# Patient Record
Sex: Male | Born: 1980 | Race: White | Hispanic: No | Marital: Married | Smoking: Former smoker
Health system: Southern US, Community
[De-identification: ages and names within clinical notes are randomized; demographics above are authoritative.]

## PROBLEM LIST (undated history)

## (undated) DIAGNOSIS — R41840 Attention and concentration deficit: Secondary | ICD-10-CM

## (undated) DIAGNOSIS — K649 Unspecified hemorrhoids: Secondary | ICD-10-CM

## (undated) HISTORY — DX: Attention and concentration deficit: R41.840

## (undated) HISTORY — DX: Unspecified hemorrhoids: K64.9

---

## 2004-08-03 ENCOUNTER — Ambulatory Visit (HOSPITAL_COMMUNITY): Admission: RE | Admit: 2004-08-03 | Discharge: 2004-08-03 | Payer: Self-pay | Admitting: Family Medicine

## 2008-05-10 ENCOUNTER — Ambulatory Visit (HOSPITAL_COMMUNITY): Admission: RE | Admit: 2008-05-10 | Discharge: 2008-05-10 | Payer: Self-pay | Admitting: Urology

## 2008-07-21 ENCOUNTER — Ambulatory Visit: Payer: Self-pay | Admitting: Gastroenterology

## 2008-10-06 ENCOUNTER — Ambulatory Visit: Payer: Self-pay | Admitting: Gastroenterology

## 2010-11-13 ENCOUNTER — Emergency Department: Payer: Self-pay | Admitting: Emergency Medicine

## 2013-12-19 ENCOUNTER — Emergency Department: Payer: Self-pay | Admitting: Emergency Medicine

## 2013-12-19 ENCOUNTER — Ambulatory Visit: Payer: Self-pay | Admitting: Student

## 2013-12-21 DIAGNOSIS — M958 Other specified acquired deformities of musculoskeletal system: Secondary | ICD-10-CM | POA: Insufficient documentation

## 2013-12-21 DIAGNOSIS — S82899A Other fracture of unspecified lower leg, initial encounter for closed fracture: Secondary | ICD-10-CM | POA: Insufficient documentation

## 2014-01-10 DIAGNOSIS — Z8781 Personal history of (healed) traumatic fracture: Secondary | ICD-10-CM | POA: Insufficient documentation

## 2014-01-10 DIAGNOSIS — Z9889 Other specified postprocedural states: Secondary | ICD-10-CM

## 2014-03-17 DIAGNOSIS — S82843A Displaced bimalleolar fracture of unspecified lower leg, initial encounter for closed fracture: Secondary | ICD-10-CM | POA: Insufficient documentation

## 2014-11-18 HISTORY — PX: ANKLE SURGERY: SHX546

## 2015-02-20 ENCOUNTER — Ambulatory Visit
Admit: 2015-02-20 | Disposition: A | Discharge status: Home / Self Care | Payer: Self-pay | Attending: Pain Medicine | Admitting: Pain Medicine
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
## 2015-04-24 ENCOUNTER — Encounter: Payer: Self-pay | Admitting: Emergency Medicine

## 2015-04-24 ENCOUNTER — Emergency Department: Payer: BLUE CROSS/BLUE SHIELD

## 2015-04-24 ENCOUNTER — Emergency Department
Admission: EM | Admit: 2015-04-24 | Discharge: 2015-04-24 | Disposition: A | Discharge status: Home / Self Care | Payer: BLUE CROSS/BLUE SHIELD | Attending: Emergency Medicine | Admitting: Emergency Medicine

## 2015-04-24 DIAGNOSIS — S20412A Abrasion of left back wall of thorax, initial encounter: Secondary | ICD-10-CM | POA: Diagnosis not present

## 2015-04-24 DIAGNOSIS — S60512A Abrasion of left hand, initial encounter: Secondary | ICD-10-CM | POA: Insufficient documentation

## 2015-04-24 DIAGNOSIS — Y998 Other external cause status: Secondary | ICD-10-CM | POA: Diagnosis not present

## 2015-04-24 DIAGNOSIS — S80811A Abrasion, right lower leg, initial encounter: Secondary | ICD-10-CM | POA: Diagnosis not present

## 2015-04-24 DIAGNOSIS — S40811A Abrasion of right upper arm, initial encounter: Secondary | ICD-10-CM

## 2015-04-24 DIAGNOSIS — Y9389 Activity, other specified: Secondary | ICD-10-CM | POA: Diagnosis not present

## 2015-04-24 DIAGNOSIS — S60511A Abrasion of right hand, initial encounter: Secondary | ICD-10-CM | POA: Diagnosis not present

## 2015-04-24 DIAGNOSIS — Y9289 Other specified places as the place of occurrence of the external cause: Secondary | ICD-10-CM | POA: Diagnosis not present

## 2015-04-24 DIAGNOSIS — S50311A Abrasion of right elbow, initial encounter: Secondary | ICD-10-CM | POA: Insufficient documentation

## 2015-04-24 DIAGNOSIS — Z720 Tobacco use: Secondary | ICD-10-CM | POA: Diagnosis not present

## 2015-04-24 DIAGNOSIS — S82142A Displaced bicondylar fracture of left tibia, initial encounter for closed fracture: Secondary | ICD-10-CM

## 2015-04-24 DIAGNOSIS — S82102A Unspecified fracture of upper end of left tibia, initial encounter for closed fracture: Secondary | ICD-10-CM | POA: Insufficient documentation

## 2015-04-24 DIAGNOSIS — S8992XA Unspecified injury of left lower leg, initial encounter: Secondary | ICD-10-CM | POA: Diagnosis present

## 2015-04-24 MED ORDER — SILVER SULFADIAZINE 1 % EX CREA
TOPICAL_CREAM | Freq: Once | CUTANEOUS | Status: AC
  Administered 2015-04-24: 15:00:00 via TOPICAL

## 2015-04-24 MED ORDER — SILVER SULFADIAZINE 1 % EX CREA
TOPICAL_CREAM | CUTANEOUS | Status: AC
  Filled 2015-04-24: qty 85

## 2015-04-24 MED ORDER — NAPROXEN 500 MG PO TABS: 500.0000 mg | ORAL_TABLET | Freq: Two times a day (BID) | ORAL | Status: DC

## 2015-04-24 MED ORDER — OXYCODONE-ACETAMINOPHEN 5-325 MG PO TABS: 1.0000 | ORAL_TABLET | Freq: Four times a day (QID) | ORAL | Status: DC | PRN

## 2015-04-24 MED ORDER — SILVER SULFADIAZINE 1 % EX CREA: TOPICAL_CREAM | Freq: Two times a day (BID) | CUTANEOUS | Status: AC

## 2015-04-24 NOTE — ED Notes (Signed)
Patient refused crutches stating he has a pair at home that he can use.
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
## 2015-04-24 NOTE — ED Notes (Signed)
AAOx3 skin warm and dry.  D/C patient in wheelchair.  Verbalized understanding of d/c instructions.
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
## 2015-04-24 NOTE — Discharge Instructions (Signed)

## 2015-05-01 DIAGNOSIS — S82142A Displaced bicondylar fracture of left tibia, initial encounter for closed fracture: Secondary | ICD-10-CM | POA: Insufficient documentation

## 2016-10-09 ENCOUNTER — Emergency Department
Admission: EM | Admit: 2016-10-09 | Discharge: 2016-10-09 | Disposition: A | Discharge status: Home / Self Care | Payer: No Typology Code available for payment source | Attending: Emergency Medicine | Admitting: Emergency Medicine

## 2016-10-09 ENCOUNTER — Emergency Department: Payer: No Typology Code available for payment source

## 2016-10-09 ENCOUNTER — Encounter: Payer: Self-pay | Admitting: Emergency Medicine

## 2016-10-09 DIAGNOSIS — M545 Low back pain: Secondary | ICD-10-CM | POA: Diagnosis not present

## 2016-10-09 DIAGNOSIS — Y9389 Activity, other specified: Secondary | ICD-10-CM | POA: Insufficient documentation

## 2016-10-09 DIAGNOSIS — S39012A Strain of muscle, fascia and tendon of lower back, initial encounter: Secondary | ICD-10-CM | POA: Diagnosis not present

## 2016-10-09 DIAGNOSIS — Y92410 Unspecified street and highway as the place of occurrence of the external cause: Secondary | ICD-10-CM | POA: Insufficient documentation

## 2016-10-09 DIAGNOSIS — Y999 Unspecified external cause status: Secondary | ICD-10-CM | POA: Diagnosis not present

## 2016-10-09 DIAGNOSIS — S3992XA Unspecified injury of lower back, initial encounter: Secondary | ICD-10-CM | POA: Diagnosis not present

## 2016-10-09 DIAGNOSIS — M791 Myalgia: Secondary | ICD-10-CM | POA: Diagnosis not present

## 2016-10-09 DIAGNOSIS — Z87891 Personal history of nicotine dependence: Secondary | ICD-10-CM | POA: Insufficient documentation

## 2016-10-09 DIAGNOSIS — M7918 Myalgia, other site: Secondary | ICD-10-CM

## 2016-10-09 DIAGNOSIS — R51 Headache: Secondary | ICD-10-CM | POA: Insufficient documentation

## 2016-10-09 DIAGNOSIS — Z791 Long term (current) use of non-steroidal anti-inflammatories (NSAID): Secondary | ICD-10-CM | POA: Diagnosis not present

## 2016-10-09 MED ORDER — IBUPROFEN 800 MG PO TABS: 800.0000 mg | ORAL_TABLET | Freq: Three times a day (TID) | ORAL | 0 refills | Status: DC | PRN

## 2016-10-09 MED ORDER — OXYCODONE-ACETAMINOPHEN 5-325 MG PO TABS: 1.0000 | ORAL_TABLET | Freq: Four times a day (QID) | ORAL | 0 refills | Status: DC | PRN

## 2016-10-09 MED ORDER — CYCLOBENZAPRINE HCL 10 MG PO TABS: 10.0000 mg | ORAL_TABLET | Freq: Three times a day (TID) | ORAL | 0 refills | Status: DC | PRN

## 2016-10-09 NOTE — ED Notes (Signed)
Pt discharged home after verbalizing understanding of discharge instructions; nad noted. 
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
## 2017-01-16 DIAGNOSIS — Z13220 Encounter for screening for lipoid disorders: Secondary | ICD-10-CM | POA: Diagnosis not present

## 2017-01-16 DIAGNOSIS — F321 Major depressive disorder, single episode, moderate: Secondary | ICD-10-CM | POA: Diagnosis not present

## 2017-01-16 DIAGNOSIS — Z0001 Encounter for general adult medical examination with abnormal findings: Secondary | ICD-10-CM | POA: Diagnosis not present

## 2017-01-16 DIAGNOSIS — Z1329 Encounter for screening for other suspected endocrine disorder: Secondary | ICD-10-CM | POA: Diagnosis not present

## 2017-01-16 DIAGNOSIS — F1410 Cocaine abuse, uncomplicated: Secondary | ICD-10-CM | POA: Diagnosis not present

## 2017-01-16 DIAGNOSIS — F17210 Nicotine dependence, cigarettes, uncomplicated: Secondary | ICD-10-CM | POA: Diagnosis not present

## 2017-01-16 DIAGNOSIS — Z131 Encounter for screening for diabetes mellitus: Secondary | ICD-10-CM | POA: Diagnosis not present

## 2017-03-04 DIAGNOSIS — K648 Other hemorrhoids: Secondary | ICD-10-CM | POA: Diagnosis not present

## 2017-03-04 DIAGNOSIS — K5900 Constipation, unspecified: Secondary | ICD-10-CM | POA: Diagnosis not present

## 2017-03-06 ENCOUNTER — Encounter: Payer: Self-pay | Admitting: General Surgery

## 2017-03-11 ENCOUNTER — Ambulatory Visit: Payer: Self-pay | Admitting: General Surgery

## 2017-03-13 ENCOUNTER — Encounter: Payer: Self-pay | Admitting: General Surgery

## 2017-03-13 ENCOUNTER — Ambulatory Visit (INDEPENDENT_AMBULATORY_CARE_PROVIDER_SITE_OTHER): Payer: BLUE CROSS/BLUE SHIELD | Admitting: General Surgery

## 2017-03-13 VITALS — BP 116/72 | HR 72 | Resp 12 | Ht 72.0 in | Wt 171.0 lb

## 2017-03-13 DIAGNOSIS — K645 Perianal venous thrombosis: Secondary | ICD-10-CM

## 2017-03-13 NOTE — Patient Instructions (Addendum)
Advised dietary changes to include more fruits and vegetables, less starch and meat, and plenty of water. Use Miralax daily. Goal is to get bowels moving daily.    No further appointment needed at this time. Patient instructed to call if needed.

## 2017-03-13 NOTE — Progress Notes (Signed)
Patient ID: Delle Reining., male   DOB: Feb 09, 1981, 36 y.o.   MRN: [id number]  Chief Complaint  Patient presents with  . Rectal Problems    hemorrhoids    HPI Mujahid Jalomo. is a 36 y.o. male.  Here today for evaluation  of hemorrhoids. He states his bowels move 2 times a week even with the use of stool softeners and fiber supplements. He states he has had hemorrhoids for 10 years it is just getting worse. Th pain comes and goes, no bleeding. He does use procoto-hc cream and preparation H but it doesn't help much. He states he can feel knots at his rectum. He states it's flares up about once a week. He does do heavy lifting at his job.  HPI  Past Medical History:  Diagnosis Date  . Hemorrhoid     Past Surgical History:  Procedure Laterality Date  . ANKLE SURGERY  2016    Family History  Problem Relation Age of Onset  . COPD Father     Social History Social History  Substance Use Topics  . Smoking status: Former Smoker    Types: Cigarettes    Quit date: 2017  . Smokeless tobacco: Never Used  . Alcohol use No    No Known Allergies  Current Outpatient Prescriptions  Medication Sig Dispense Refill  . docusate sodium (COLACE) 100 MG capsule Take 100 mg by mouth daily.    Marland Kitchen ibuprofen (ADVIL,MOTRIN) 800 MG tablet Take 1 tablet (800 mg total) by mouth every 8 (eight) hours as needed for moderate pain. 15 tablet 0  . Multiple Vitamin (MULTIVITAMIN) capsule Take 1 capsule by mouth daily.    . naproxen (NAPROSYN) 500 MG tablet Take 1 tablet (500 mg total) by mouth 2 (two) times daily with a meal. 30 tablet 0  . PROCTO-MED HC 2.5 % rectal cream Place 1 application rectally as needed.   2   No current facility-administered medications for this visit.     Review of Systems Review of Systems  Blood pressure 116/72, pulse 72, resp. rate 12, height 6' (1.829 m), weight 171 lb (77.6 kg).  Physical Exam Physical Exam  Constitutional: He is oriented to person, place,  and time. He appears well-developed and well-nourished.  HENT:  Mouth/Throat: Oropharynx is clear and moist.  Eyes: Conjunctivae are normal. No scleral icterus.  Neck: Neck supple.  Cardiovascular: Normal rate, regular rhythm and normal heart sounds.   Pulmonary/Chest: Effort normal and breath sounds normal.  Abdominal: Soft. Bowel sounds are normal. There is no tenderness.  Genitourinary: Rectal exam shows external hemorrhoid. Rectal exam shows no internal hemorrhoid and no fissure.  Genitourinary Comments: Thrombosed external hemorrhoid about 1 cm in size. Increased sphincter tone. No internal prolapsed hemorrhoids.   Lymphadenopathy:    He has no cervical adenopathy.  Neurological: He is alert and oriented to person, place, and time.  Skin: Skin is warm and dry.  Psychiatric: His behavior is normal.    Data Reviewed History reviewed  Assessment    Thrombosed external hemorrhoid about 1 cm in size. Increased sphincter tone. No internal prolapsed hemorrhoids.   History of constipation likely aggravating his hemorrhoid symptoms  Plan    Advised dietary changes to include more fruits and vegetables, less starch and meat, and plenty of water. Use Miralax daily. Goal is to get bowels moving more easily   No further appointment needed at this time. Patient instructed to call if needed.    HPI, Physical Exam,  Assessment and Plan have been scribed under the direction and in the presence of Kathreen Cosier, MD  Dorathy Daft, RN  I have completed the exam and reviewed the above documentation for accuracy and completeness.  I agree with the above.  Museum/gallery conservator has been used and any errors in dictation or transcription are unintentional.  Allysia Ingles G. Evette Cristal, M.D., F.A.C.S.  Gerlene Burdock G 03/13/2017, 4:57 PM

## 2017-04-23 DIAGNOSIS — Z0001 Encounter for general adult medical examination with abnormal findings: Secondary | ICD-10-CM | POA: Diagnosis not present

## 2017-04-23 DIAGNOSIS — G4719 Other hypersomnia: Secondary | ICD-10-CM | POA: Diagnosis not present

## 2017-04-23 DIAGNOSIS — M064 Inflammatory polyarthropathy: Secondary | ICD-10-CM | POA: Diagnosis not present

## 2017-04-24 ENCOUNTER — Other Ambulatory Visit: Payer: Self-pay | Admitting: Nurse Practitioner

## 2017-04-24 ENCOUNTER — Ambulatory Visit
Admission: RE | Admit: 2017-04-24 | Discharge: 2017-04-24 | Disposition: A | Discharge status: Home / Self Care | Payer: BLUE CROSS/BLUE SHIELD | Source: Ambulatory Visit | Attending: Nurse Practitioner | Admitting: Nurse Practitioner

## 2017-04-24 DIAGNOSIS — R0602 Shortness of breath: Secondary | ICD-10-CM | POA: Insufficient documentation

## 2017-05-07 DIAGNOSIS — M064 Inflammatory polyarthropathy: Secondary | ICD-10-CM | POA: Diagnosis not present

## 2017-05-07 DIAGNOSIS — G4719 Other hypersomnia: Secondary | ICD-10-CM | POA: Diagnosis not present

## 2017-05-07 DIAGNOSIS — R41840 Attention and concentration deficit: Secondary | ICD-10-CM | POA: Diagnosis not present

## 2017-05-19 DIAGNOSIS — F17210 Nicotine dependence, cigarettes, uncomplicated: Secondary | ICD-10-CM | POA: Diagnosis not present

## 2017-05-19 DIAGNOSIS — G4710 Hypersomnia, unspecified: Secondary | ICD-10-CM | POA: Diagnosis not present

## 2017-05-29 DIAGNOSIS — R41840 Attention and concentration deficit: Secondary | ICD-10-CM | POA: Diagnosis not present

## 2017-05-29 DIAGNOSIS — G4710 Hypersomnia, unspecified: Secondary | ICD-10-CM | POA: Diagnosis not present

## 2017-08-07 DIAGNOSIS — G4733 Obstructive sleep apnea (adult) (pediatric): Secondary | ICD-10-CM | POA: Diagnosis not present

## 2017-08-14 DIAGNOSIS — G4710 Hypersomnia, unspecified: Secondary | ICD-10-CM | POA: Diagnosis not present

## 2017-08-14 DIAGNOSIS — F17210 Nicotine dependence, cigarettes, uncomplicated: Secondary | ICD-10-CM | POA: Diagnosis not present

## 2017-08-27 DIAGNOSIS — G4719 Other hypersomnia: Secondary | ICD-10-CM | POA: Diagnosis not present

## 2017-08-27 DIAGNOSIS — R41840 Attention and concentration deficit: Secondary | ICD-10-CM | POA: Diagnosis not present

## 2017-08-27 DIAGNOSIS — G5600 Carpal tunnel syndrome, unspecified upper limb: Secondary | ICD-10-CM | POA: Diagnosis not present

## 2017-09-22 DIAGNOSIS — G4710 Hypersomnia, unspecified: Secondary | ICD-10-CM | POA: Diagnosis not present

## 2017-09-28 DIAGNOSIS — R41840 Attention and concentration deficit: Secondary | ICD-10-CM | POA: Diagnosis not present

## 2017-09-28 DIAGNOSIS — G4719 Other hypersomnia: Secondary | ICD-10-CM | POA: Diagnosis not present

## 2017-11-25 ENCOUNTER — Ambulatory Visit: Payer: BLUE CROSS/BLUE SHIELD | Admitting: Nurse Practitioner

## 2017-11-25 ENCOUNTER — Other Ambulatory Visit: Payer: Self-pay

## 2017-11-25 ENCOUNTER — Encounter: Payer: Self-pay | Admitting: Nurse Practitioner

## 2017-11-25 VITALS — BP 120/80 | HR 81 | Resp 16 | Ht 72.0 in | Wt 169.8 lb

## 2017-11-25 DIAGNOSIS — G5603 Carpal tunnel syndrome, bilateral upper limbs: Secondary | ICD-10-CM

## 2017-11-25 DIAGNOSIS — R41840 Attention and concentration deficit: Secondary | ICD-10-CM | POA: Diagnosis not present

## 2017-11-25 DIAGNOSIS — F17210 Nicotine dependence, cigarettes, uncomplicated: Secondary | ICD-10-CM | POA: Insufficient documentation

## 2017-11-25 DIAGNOSIS — F321 Major depressive disorder, single episode, moderate: Secondary | ICD-10-CM | POA: Insufficient documentation

## 2017-11-25 DIAGNOSIS — M064 Inflammatory polyarthropathy: Secondary | ICD-10-CM | POA: Insufficient documentation

## 2017-11-25 DIAGNOSIS — G4710 Hypersomnia, unspecified: Secondary | ICD-10-CM | POA: Diagnosis not present

## 2017-11-25 DIAGNOSIS — M19171 Post-traumatic osteoarthritis, right ankle and foot: Secondary | ICD-10-CM | POA: Insufficient documentation

## 2017-11-25 DIAGNOSIS — K5900 Constipation, unspecified: Secondary | ICD-10-CM | POA: Insufficient documentation

## 2017-11-25 MED ORDER — AMPHETAMINE-DEXTROAMPHETAMINE 20 MG PO TABS: 20.0000 mg | ORAL_TABLET | Freq: Two times a day (BID) | ORAL | 0 refills | Status: DC | PRN

## 2017-11-25 NOTE — Progress Notes (Signed)
[facility] [address] [place], [place] [address]  Internal MEDICINE  Office Visit Note  Patient Name: Kevin ReiningHarold K Hockey Jr.  [id number] 13, 1982  [id number]  Date of Service: 11/25/2017     Complaints/HPI Pt is here for routine follow up.  The patient is here for routine follow up. Needs to have refills of adderall. Helps to keep him focused and on track, especially at work. He has no negative     Current Medication: Outpatient Encounter Medications as of 11/25/2017  Medication Sig  . amphetamine-dextroamphetamine (ADDERALL) 20 MG tablet Take 20 mg by mouth 2 (two) times daily as needed (for focus).  Marland Kitchen. docusate sodium (COLACE) 100 MG capsule Take 100 mg by mouth daily.  Marland Kitchen. ibuprofen (ADVIL,MOTRIN) 800 MG tablet Take 1 tablet (800 mg total) by mouth every 8 (eight) hours as needed for moderate pain.  . meloxicam (MOBIC) 7.5 MG tablet Take 7.5 mg by mouth as needed for pain.  . Multiple Vitamin (MULTIVITAMIN) capsule Take 1 capsule by mouth daily.  Marland Kitchen. PROCTO-MED HC 2.5 % rectal cream Place 1 application rectally as needed.   . naproxen (NAPROSYN) 500 MG tablet Take 1 tablet (500 mg total) by mouth 2 (two) times daily with a meal. (Patient not taking: Reported on 11/25/2017)  . zolpidem (AMBIEN CR) 6.25 MG CR tablet Take 6.25 mg by mouth at bedtime.   No facility-administered encounter medications on file as of 11/25/2017.     Surgical History: Past Surgical History:  Procedure Laterality Date  . ANKLE SURGERY  2016    Medical History: Past Medical History:  Diagnosis Date  . Hemorrhoid     Family History: Family History  Problem Relation Age of Onset  . COPD Father     Social History   Socioeconomic History  . Marital status: Married    Spouse name: Not on file  . Number of children: Not on file  . Years of education: Not on file  . Highest education level: Not on file  Social Needs  . Financial resource strain: Not on file  . Food insecurity - worry: Not on  file  . Food insecurity - inability: Not on file  . Transportation needs - medical: Not on file  . Transportation needs - non-medical: Not on file  Occupational History  . Not on file  Tobacco Use  . Smoking status: Former Smoker    Types: Cigarettes    Last attempt to quit: 2017    Years since quitting: 2.0  . Smokeless tobacco: Never Used  Substance and Sexual Activity  . Alcohol use: No  . Drug use: No  . Sexual activity: Not on file  Other Topics Concern  . Not on file  Social History Narrative  . Not on file      Review of Systems  Constitutional: Positive for fatigue.  HENT: Negative for postnasal drip, sinus pressure and sore throat.   Respiratory: Negative for cough, chest tightness and wheezing.   Cardiovascular: Negative for chest pain and palpitations.  Gastrointestinal: Negative for constipation, diarrhea, nausea and vomiting.  Endocrine: Negative.   Genitourinary: Negative.   Musculoskeletal: Positive for arthralgias.  Skin: Negative.   Neurological: Negative for weakness and headaches.  Psychiatric/Behavioral: Positive for decreased concentration and sleep disturbance.    Today's Vitals   11/25/17 0937  BP: 120/80  Pulse: 81  Resp: 16  SpO2: 97%  Weight: 169 lb 12.8 oz (77 kg)  Height: 6' (1.829 m)    Physical Exam  Constitutional: He is oriented to person, place, and time. He appears well-developed and well-nourished.  HENT:  Head: Normocephalic and atraumatic.  Eyes: Pupils are equal, round, and reactive to light.  Neck: Normal range of motion. Neck supple.  Cardiovascular: Normal rate, regular rhythm and normal heart sounds.  Pulmonary/Chest: Effort normal and breath sounds normal. He has no wheezes.  Abdominal: Soft. Bowel sounds are normal. There is no tenderness.  Musculoskeletal: Normal range of motion.  Lymphadenopathy:    He has no cervical adenopathy.  Neurological: He is alert and oriented to person, place, and time.  Skin: Skin is  warm and dry.  Psychiatric: He has a normal mood and affect.  Nursing note and vitals reviewed.   Assessment/Plan:    ICD-10-CM   1. Bilateral carpal tunnel syndrome G56.03   2. Attention and concentration deficit R41.840 amphetamine-dextroamphetamine (ADDERALL) 20 MG tablet    DISCONTINUED: amphetamine-dextroamphetamine (ADDERALL) 20 MG tablet    DISCONTINUED: amphetamine-dextroamphetamine (ADDERALL) 20 MG tablet  3. Hypersomnia, unspecified G47.10   1. Carpal tunnel improving. Continue to use splints at night. meloxicam BID as needed. 2. Renew adderall 20mg  BID when needed for focus and concentration. Three 30 day prescriptions provided. Dates are 11/25/2017, 12/24/2017, and 01/21/2018 3. Improved hypersomnia. Continue to manage good sleep hygeine. Adderall bid prn severe hypersomnia.   Wellness visit I 3 months. Follow up as scheduled.    General Counseling: I have discussed the findings of the evaluation and examination with Kevin Whitehead.  I have also discussed any further diagnostic evaluation that may be needed or ordered today. Kevin Whitehead verbalizes understanding of the findings of todays visit. We also reviewed his medications today. he has been encouraged to call the office with any questions or concerns that should arise related to todays visit.  This patient was seen by Vincent Gros, FNP- C in Collaboration with Dr Lyndon Code as a part of collaborative care agreement   Time spent:15 minutes    Dr Lyndon Code Internal medicine

## 2018-02-26 ENCOUNTER — Encounter: Payer: Self-pay | Admitting: Nurse Practitioner

## 2018-02-26 ENCOUNTER — Ambulatory Visit: Payer: BLUE CROSS/BLUE SHIELD | Admitting: Nurse Practitioner

## 2018-02-26 VITALS — BP 124/76 | HR 96 | Resp 16 | Ht 72.0 in | Wt 168.8 lb

## 2018-02-26 DIAGNOSIS — R41840 Attention and concentration deficit: Secondary | ICD-10-CM | POA: Diagnosis not present

## 2018-02-26 DIAGNOSIS — M064 Inflammatory polyarthropathy: Secondary | ICD-10-CM | POA: Diagnosis not present

## 2018-02-26 MED ORDER — AMPHETAMINE-DEXTROAMPHETAMINE 20 MG PO TABS: 20.0000 mg | ORAL_TABLET | Freq: Two times a day (BID) | ORAL | 0 refills | Status: DC | PRN

## 2018-02-26 NOTE — Progress Notes (Signed)
[facility] [address] [place], [place] [address]  Internal MEDICINE  Office Visit Note  Patient Name: Kevin Whitehead  [id number]  [id number]  Date of Service: 02/26/2018  Chief Complaint  Patient presents with  . attention and concentration deficit    well controlled on current dose of adderall   . hypersomnia    Patient is here for routine follow up visit. Today, stating he still gets intermittent pain in both hands. Will take meloxicam on those days. Will take edge off of pain, but not relieve it completely. Overall, frequency and severity of symptoms is unchanged.  He does need refills of adderall. Today. He will take this twice daily when needed. Keeps him focused and on track without negative side effects.    Pt is here for routine follow up.    Current Medication: Outpatient Encounter Medications as of 02/26/2018  Medication Sig  . amphetamine-dextroamphetamine (ADDERALL) 20 MG tablet Take 1 tablet (20 mg total) by mouth 2 (two) times daily as needed (for focus).  Marland Kitchen docusate sodium (COLACE) 100 MG capsule Take 100 mg by mouth daily.  . meloxicam (MOBIC) 7.5 MG tablet Take 7.5 mg by mouth as needed for pain.  . Multiple Vitamin (MULTIVITAMIN) capsule Take 1 capsule by mouth daily.  Marland Kitchen PROCTO-MED HC 2.5 % rectal cream Place 1 application rectally as needed.   . [DISCONTINUED] amphetamine-dextroamphetamine (ADDERALL) 20 MG tablet Take 1 tablet (20 mg total) by mouth 2 (two) times daily as needed (for focus).  . [DISCONTINUED] amphetamine-dextroamphetamine (ADDERALL) 20 MG tablet Take 1 tablet (20 mg total) by mouth 2 (two) times daily as needed (for focus).  . [DISCONTINUED] amphetamine-dextroamphetamine (ADDERALL) 20 MG tablet Take 1 tablet (20 mg total) by mouth 2 (two) times daily as needed (for focus).  . [DISCONTINUED] ibuprofen (ADVIL,MOTRIN) 800 MG tablet Take 1 tablet (800 mg total) by mouth every 8 (eight) hours as needed for moderate pain.  .  [DISCONTINUED] naproxen (NAPROSYN) 500 MG tablet Take 1 tablet (500 mg total) by mouth 2 (two) times daily with a meal.  . [DISCONTINUED] zolpidem (AMBIEN CR) 6.25 MG CR tablet Take 6.25 mg by mouth at bedtime.   No facility-administered encounter medications on file as of 02/26/2018.     Surgical History: Past Surgical History:  Procedure Laterality Date  . ANKLE SURGERY  2016    Medical History: Past Medical History:  Diagnosis Date  . Hemorrhoid     Family History: Family History  Problem Relation Age of Onset  . COPD Father     Social History   Socioeconomic History  . Marital status: Married    Spouse name: Not on file  . Number of children: Not on file  . Years of education: Not on file  . Highest education level: Not on file  Occupational History  . Not on file  Social Needs  . Financial resource strain: Not on file  . Food insecurity:    Worry: Not on file    Inability: Not on file  . Transportation needs:    Medical: Not on file    Non-medical: Not on file  Tobacco Use  . Smoking status: Former Smoker    Types: Cigarettes    Last attempt to quit: 2017    Years since quitting: 2.2  . Smokeless tobacco: Never Used  Substance and Sexual Activity  . Alcohol use: No  . Drug use: No  . Sexual activity: Not on file  Lifestyle  . Physical  activity:    Days per week: Not on file    Minutes per session: Not on file  . Stress: Not on file  Relationships  . Social connections:    Talks on phone: Not on file    Gets together: Not on file    Attends religious service: Not on file    Active member of club or organization: Not on file    Attends meetings of clubs or organizations: Not on file    Relationship status: Not on file  . Intimate partner violence:    Fear of current or ex partner: Not on file    Emotionally abused: Not on file    Physically abused: Not on file    Forced sexual activity: Not on file  Other Topics Concern  . Not on file  Social  History Narrative  . Not on file      Review of Systems  Constitutional: Positive for fatigue.  HENT: Negative for congestion, postnasal drip, sinus pressure and sore throat.   Eyes: Negative.   Respiratory: Negative for cough, chest tightness and wheezing.   Cardiovascular: Negative for chest pain and palpitations.  Gastrointestinal: Negative for constipation, diarrhea, nausea and vomiting.  Endocrine: Negative for cold intolerance, heat intolerance, polydipsia, polyphagia and polyuria.  Genitourinary: Negative.   Musculoskeletal: Positive for arthralgias.  Skin: Negative for rash.  Allergic/Immunologic: Positive for environmental allergies.  Neurological: Negative for weakness and headaches.  Hematological: Negative for adenopathy.  Psychiatric/Behavioral: Positive for decreased concentration and sleep disturbance.    Today's Vitals   02/26/18 0902  BP: 124/76  Pulse: 96  Resp: 16  SpO2: 94%  Weight: 168 lb 12.8 oz (76.6 kg)  Height: 6' (1.829 m)    Physical Exam  Constitutional: He is oriented to person, place, and time. He appears well-developed and well-nourished.  HENT:  Head: Normocephalic and atraumatic.  Eyes: Pupils are equal, round, and reactive to light. EOM are normal.  Neck: Normal range of motion. Neck supple. No thyromegaly present.  Cardiovascular: Normal rate, regular rhythm and normal heart sounds.  Pulmonary/Chest: Effort normal and breath sounds normal. He has no wheezes.  Abdominal: Soft. Bowel sounds are normal. There is no tenderness.  Musculoskeletal: Normal range of motion.  Lymphadenopathy:    He has no cervical adenopathy.  Neurological: He is alert and oriented to person, place, and time.  Skin: Skin is warm and dry.  Psychiatric: He has a normal mood and affect. His behavior is normal. Judgment and thought content normal.  Nursing note and vitals reviewed.   Assessment/Plan: 1. Attention and concentration deficit Continue adderall  20mg  twice daily. Thee 30 day prescriptions provided. Dates are 02/26/2018, 03/26/2018, and 04/24/2018.  - amphetamine-dextroamphetamine (ADDERALL) 20 MG tablet; Take 1 tablet (20 mg total) by mouth 2 (two) times daily as needed (for focus).  Dispense: 60 tablet; Refill: 0  2. Inflammatory polyarthropathy (HCC) Continue meloxicam as needed for pain/inflammation.   General Counseling: skylur fuston understanding of the findings of todays visit and agrees with plan of treatment. I have discussed any further diagnostic evaluation that may be needed or ordered today. We also reviewed his medications today. he has been encouraged to call the office with any questions or concerns that should arise related to todays visit.  This patient was seen by Vincent Gros, FNP- C in Collaboration with Dr Lyndon Code as a part of collaborative care agreement       Meds ordered this encounter  Medications  . DISCONTD:  amphetamine-dextroamphetamine (ADDERALL) 20 MG tablet    Sig: Take 1 tablet (20 mg total) by mouth 2 (two) times daily as needed (for focus).    Dispense:  60 tablet    Refill:  0    Order Specific Question:   Supervising Provider    Answer:   Lyndon CodeKHAN, FOZIA M [[id number]]  . DISCONTD: amphetamine-dextroamphetamine (ADDERALL) 20 MG tablet    Sig: Take 1 tablet (20 mg total) by mouth 2 (two) times daily as needed (for focus).    Dispense:  60 tablet    Refill:  0    Fill after 03/26/2018    Order Specific Question:   Supervising Provider    Answer:   Lyndon CodeKHAN, FOZIA M [[id number]]  . amphetamine-dextroamphetamine (ADDERALL) 20 MG tablet    Sig: Take 1 tablet (20 mg total) by mouth 2 (two) times daily as needed (for focus).    Dispense:  60 tablet    Refill:  0    Fill after 04/24/2018    Order Specific Question:   Supervising Provider    Answer:   Lyndon CodeKHAN, FOZIA M [[id number]]    Time spent: 4215 Minutes     Dr Lyndon CodeFozia M Khan Internal medicine

## 2018-05-28 ENCOUNTER — Encounter: Payer: Self-pay | Admitting: Nurse Practitioner

## 2018-05-28 ENCOUNTER — Ambulatory Visit: Payer: BLUE CROSS/BLUE SHIELD | Admitting: Nurse Practitioner

## 2018-05-28 VITALS — BP 111/74 | HR 91 | Resp 16 | Ht 72.0 in | Wt 165.8 lb

## 2018-05-28 DIAGNOSIS — R41840 Attention and concentration deficit: Secondary | ICD-10-CM

## 2018-05-28 DIAGNOSIS — G5603 Carpal tunnel syndrome, bilateral upper limbs: Secondary | ICD-10-CM

## 2018-05-28 MED ORDER — AMPHETAMINE-DEXTROAMPHETAMINE 20 MG PO TABS: 20.0000 mg | ORAL_TABLET | Freq: Two times a day (BID) | ORAL | 0 refills | Status: DC | PRN

## 2018-05-28 NOTE — Progress Notes (Signed)
[facility] [address] [place], [place] [address]  Internal MEDICINE  Office Visit Note  Patient Name: Kevin Whitehead  [id number]  [id number]  Date of Service: 05/28/2018   Pt is here for routine follow up.   Chief Complaint  Patient presents with  . inflammatory polyarthropathy    3 month follow up  . ADD    currently on adderall 20mg  bid when needed.     Patient is here for routine follow up visit. Today, stating he still gets intermittent pain in both hands. No longer taking meloxicam. Was really not noting it helping with hand pain. Knows that strenuous work with his hands makes the pain worse. Will generally massage the joints of hands to help improve pain.  He does need refills of adderall. Today. He will take this twice daily when needed. Keeps him focused and on track without negative side effects.       Current Medication: Outpatient Encounter Medications as of 05/28/2018  Medication Sig  . amphetamine-dextroamphetamine (ADDERALL) 20 MG tablet Take 1 tablet (20 mg total) by mouth 2 (two) times daily as needed (for focus).  Marland Kitchen docusate sodium (COLACE) 100 MG capsule Take 100 mg by mouth daily.  . meloxicam (MOBIC) 7.5 MG tablet Take 7.5 mg by mouth as needed for pain.  . Multiple Vitamin (MULTIVITAMIN) capsule Take 1 capsule by mouth daily.  Marland Kitchen PROCTO-MED HC 2.5 % rectal cream Place 1 application rectally as needed.   . [DISCONTINUED] amphetamine-dextroamphetamine (ADDERALL) 20 MG tablet Take 1 tablet (20 mg total) by mouth 2 (two) times daily as needed (for focus).  . [DISCONTINUED] amphetamine-dextroamphetamine (ADDERALL) 20 MG tablet Take 1 tablet (20 mg total) by mouth 2 (two) times daily as needed (for focus).  . [DISCONTINUED] amphetamine-dextroamphetamine (ADDERALL) 20 MG tablet Take 1 tablet (20 mg total) by mouth 2 (two) times daily as needed (for focus).   No facility-administered encounter medications on file as of 05/28/2018.     Surgical  History: Past Surgical History:  Procedure Laterality Date  . ANKLE SURGERY  2016    Medical History: Past Medical History:  Diagnosis Date  . Hemorrhoid     Family History: Family History  Problem Relation Age of Onset  . COPD Father     Social History   Socioeconomic History  . Marital status: Married    Spouse name: Not on file  . Number of children: Not on file  . Years of education: Not on file  . Highest education level: Not on file  Occupational History  . Not on file  Social Needs  . Financial resource strain: Not on file  . Food insecurity:    Worry: Not on file    Inability: Not on file  . Transportation needs:    Medical: Not on file    Non-medical: Not on file  Tobacco Use  . Smoking status: Former Smoker    Types: Cigarettes    Last attempt to quit: 2017    Years since quitting: 2.5  . Smokeless tobacco: Never Used  Substance and Sexual Activity  . Alcohol use: No  . Drug use: No  . Sexual activity: Not on file  Lifestyle  . Physical activity:    Days per week: Not on file    Minutes per session: Not on file  . Stress: Not on file  Relationships  . Social connections:    Talks on phone: Not on file    Gets together: Not on  file    Attends religious service: Not on file    Active member of club or organization: Not on file    Attends meetings of clubs or organizations: Not on file    Relationship status: Not on file  . Intimate partner violence:    Fear of current or ex partner: Not on file    Emotionally abused: Not on file    Physically abused: Not on file    Forced sexual activity: Not on file  Other Topics Concern  . Not on file  Social History Narrative  . Not on file      Review of Systems  Constitutional: Positive for fatigue. Negative for activity change and unexpected weight change.  HENT: Negative for congestion, postnasal drip, sinus pressure and sore throat.   Eyes: Negative.   Respiratory: Negative for cough, chest  tightness and wheezing.   Cardiovascular: Negative for chest pain and palpitations.  Gastrointestinal: Negative for constipation, diarrhea, nausea and vomiting.  Endocrine: Negative for cold intolerance, heat intolerance, polydipsia, polyphagia and polyuria.  Genitourinary: Negative.   Musculoskeletal: Positive for arthralgias.       Bilateral hands.   Skin: Negative for rash.  Allergic/Immunologic: Positive for environmental allergies.  Neurological: Negative for dizziness, weakness and headaches.  Hematological: Negative for adenopathy.  Psychiatric/Behavioral: Positive for decreased concentration and sleep disturbance.    Vital Signs: BP 111/74 (BP Location: Right Arm, Patient Position: Sitting, Cuff Size: Normal)   Pulse 91   Resp 16   Ht 6' (1.829 m)   Wt 165 lb 12.8 oz (75.2 kg)   SpO2 94%   BMI 22.49 kg/m    Physical Exam  Constitutional: He is oriented to person, place, and time. He appears well-developed and well-nourished.  HENT:  Head: Normocephalic and atraumatic.  Nose: Nose normal.  Eyes: Pupils are equal, round, and reactive to light. Conjunctivae and EOM are normal.  Neck: Normal range of motion. Neck supple. No JVD present. No tracheal deviation present. No thyromegaly present.  Cardiovascular: Normal rate, regular rhythm and normal heart sounds.  Pulmonary/Chest: Effort normal and breath sounds normal. He has no wheezes.  Abdominal: Soft. Bowel sounds are normal. There is no tenderness.  Musculoskeletal: Normal range of motion.  Patient currently has full ROM and good strength in both hands.   Neurological: He is alert and oriented to person, place, and time.  Skin: Skin is warm and dry.  Psychiatric: He has a normal mood and affect. His behavior is normal. Judgment and thought content normal.  Nursing note and vitals reviewed.  Assessment/Plan: 1. Bilateral carpal tunnel syndrome  No longer taking NSAIDs. Recommended use of OTC Aspercreme to use as  needed for joint pain in both hands.   2. Attention and concentration deficit May continue adderall 20mg  bid as needed. Three 30 day prescriotions were sent to pharmacy. Dates are 05/28/2018, 06/26/2018, and 07/25/2018 - amphetamine-dextroamphetamine (ADDERALL) 20 MG tablet; Take 1 tablet (20 mg total) by mouth 2 (two) times daily as needed (for focus).  Dispense: 60 tablet; Refill: 0  General Counseling: Kevin Whitehead verbalizes understanding of the findings of todays visit and agrees with plan of treatment. I have discussed any further diagnostic evaluation that may be needed or ordered today. We also reviewed his medications today. he has been encouraged to call the office with any questions or concerns that should arise related to todays visit.    Counseling:   This patient was seen by Vincent GrosHeather Kavi Almquist, FNP- C in Collaboration with Dr  Lyndon Code as a part of collaborative care agreement  Meds ordered this encounter  Medications  . DISCONTD: amphetamine-dextroamphetamine (ADDERALL) 20 MG tablet    Sig: Take 1 tablet (20 mg total) by mouth 2 (two) times daily as needed (for focus).    Dispense:  60 tablet    Refill:  0    Order Specific Question:   Supervising Provider    Answer:   Lyndon Code [[id number]]  . DISCONTD: amphetamine-dextroamphetamine (ADDERALL) 20 MG tablet    Sig: Take 1 tablet (20 mg total) by mouth 2 (two) times daily as needed (for focus).    Dispense:  60 tablet    Refill:  0    To fill after 06/26/2018    Order Specific Question:   Supervising Provider    Answer:   Lyndon Code [[id number]]  . amphetamine-dextroamphetamine (ADDERALL) 20 MG tablet    Sig: Take 1 tablet (20 mg total) by mouth 2 (two) times daily as needed (for focus).    Dispense:  60 tablet    Refill:  0    To fill after 07/25/2018    Order Specific Question:   Supervising Provider    Answer:   Lyndon Code [[id number]]    Time spent: 30 Minutes       Dr Lyndon Code Internal medicine

## 2018-07-29 ENCOUNTER — Other Ambulatory Visit: Payer: Self-pay | Admitting: Nurse Practitioner

## 2018-07-29 ENCOUNTER — Telehealth: Payer: Self-pay | Admitting: Nurse Practitioner

## 2018-07-29 DIAGNOSIS — R41840 Attention and concentration deficit: Secondary | ICD-10-CM

## 2018-07-29 MED ORDER — AMPHETAMINE-DEXTROAMPHETAMINE 20 MG PO TABS: 20.0000 mg | ORAL_TABLET | Freq: Two times a day (BID) | ORAL | 0 refills | Status: DC | PRN

## 2018-07-29 NOTE — Telephone Encounter (Signed)
Current prescription for adderall on back order at his normal pharmacy. [facility] [place] for next [facility].

## 2018-07-29 NOTE — Telephone Encounter (Signed)
adderall is on backorder with this pharmacy , [facility] [place] on [address].

## 2018-07-29 NOTE — Progress Notes (Signed)
Current prescription for adderall on back order at his normal pharmacy. [facility] [place] current prescription for next month.

## 2018-08-28 ENCOUNTER — Ambulatory Visit: Payer: BLUE CROSS/BLUE SHIELD | Admitting: Nurse Practitioner

## 2018-08-28 ENCOUNTER — Encounter: Payer: Self-pay | Admitting: Nurse Practitioner

## 2018-08-28 VITALS — BP 114/75 | HR 107 | Resp 16 | Ht 72.0 in | Wt 167.2 lb

## 2018-08-28 DIAGNOSIS — G5603 Carpal tunnel syndrome, bilateral upper limbs: Secondary | ICD-10-CM

## 2018-08-28 DIAGNOSIS — R41840 Attention and concentration deficit: Secondary | ICD-10-CM | POA: Diagnosis not present

## 2018-08-28 MED ORDER — AMPHETAMINE-DEXTROAMPHETAMINE 20 MG PO TABS: 20.0000 mg | ORAL_TABLET | Freq: Two times a day (BID) | ORAL | 0 refills | Status: DC | PRN

## 2018-08-28 MED ORDER — AMPHETAMINE-DEXTROAMPHETAMINE 20 MG PO TABS
20.0000 mg | ORAL_TABLET | Freq: Two times a day (BID) | ORAL | 0 refills | Status: DC | PRN
Start: 2018-08-28 — End: 2018-08-28

## 2018-08-28 NOTE — Progress Notes (Signed)
[facility] [address] [place], [place] [address]  Internal MEDICINE  Office Visit Note  Patient Name: Kevin Whitehead  [id number]  [id number]  Date of Service: 08/28/2018  Chief Complaint  Patient presents with  . Medical Management of Chronic Issues    3 month follow up refill medications    The patient continues to have intermittent joint pain in both hands. Most severe with exertion. Uses his hands a lot in his work and they ache by the end of the day and into the evenings. Finds that taking meloxicam 15mg  helps more than anything. Will only take this if pain is very bad.  Takes adderall 20mg  twice daily when needed to help with focus and concentration. Keeps him on track and productive while at work. No negative side effects to report. Needs to have refills for this today.       Current Medication: Outpatient Encounter Medications as of 08/28/2018  Medication Sig  . amphetamine-dextroamphetamine (ADDERALL) 20 MG tablet Take 1 tablet (20 mg total) by mouth 2 (two) times daily as needed (for focus).  Marland Kitchen docusate sodium (COLACE) 100 MG capsule Take 100 mg by mouth daily.  . meloxicam (MOBIC) 7.5 MG tablet Take 7.5 mg by mouth as needed for pain.  . Multiple Vitamin (MULTIVITAMIN) capsule Take 1 capsule by mouth daily.  Marland Kitchen PROCTO-MED HC 2.5 % rectal cream Place 1 application rectally as needed.   . [DISCONTINUED] amphetamine-dextroamphetamine (ADDERALL) 20 MG tablet Take 1 tablet (20 mg total) by mouth 2 (two) times daily as needed (for focus).  . [DISCONTINUED] amphetamine-dextroamphetamine (ADDERALL) 20 MG tablet Take 1 tablet (20 mg total) by mouth 2 (two) times daily as needed (for focus).  . [DISCONTINUED] amphetamine-dextroamphetamine (ADDERALL) 20 MG tablet Take 1 tablet (20 mg total) by mouth 2 (two) times daily as needed (for focus).   No facility-administered encounter medications on file as of 08/28/2018.     Surgical History: Past Surgical History:   Procedure Laterality Date  . ANKLE SURGERY  2016    Medical History: Past Medical History:  Diagnosis Date  . Hemorrhoid     Family History: Family History  Problem Relation Age of Onset  . COPD Father     Social History   Socioeconomic History  . Marital status: Married    Spouse name: Not on file  . Number of children: Not on file  . Years of education: Not on file  . Highest education level: Not on file  Occupational History  . Not on file  Social Needs  . Financial resource strain: Not on file  . Food insecurity:    Worry: Not on file    Inability: Not on file  . Transportation needs:    Medical: Not on file    Non-medical: Not on file  Tobacco Use  . Smoking status: Former Smoker    Types: Cigarettes    Last attempt to quit: 2017    Years since quitting: 2.7  . Smokeless tobacco: Never Used  Substance and Sexual Activity  . Alcohol use: No  . Drug use: No  . Sexual activity: Not on file  Lifestyle  . Physical activity:    Days per week: Not on file    Minutes per session: Not on file  . Stress: Not on file  Relationships  . Social connections:    Talks on phone: Not on file    Gets together: Not on file    Attends religious service: Not  on file    Active member of club or organization: Not on file    Attends meetings of clubs or organizations: Not on file    Relationship status: Not on file  . Intimate partner violence:    Fear of current or ex partner: Not on file    Emotionally abused: Not on file    Physically abused: Not on file    Forced sexual activity: Not on file  Other Topics Concern  . Not on file  Social History Narrative  . Not on file      Review of Systems  Constitutional: Positive for fatigue. Negative for activity change and unexpected weight change.  HENT: Negative for congestion, postnasal drip, sinus pressure and sore throat.   Eyes: Negative.   Respiratory: Negative for cough, chest tightness and wheezing.    Cardiovascular: Negative for chest pain and palpitations.  Gastrointestinal: Negative for constipation, diarrhea, nausea and vomiting.  Endocrine: Negative for cold intolerance, heat intolerance, polydipsia, polyphagia and polyuria.  Genitourinary: Negative.   Musculoskeletal: Positive for arthralgias.       Bilateral hands.   Skin: Negative for rash.  Allergic/Immunologic: Positive for environmental allergies.  Neurological: Negative for dizziness, weakness and headaches.  Hematological: Negative for adenopathy.  Psychiatric/Behavioral: Positive for decreased concentration and sleep disturbance.    Today's Vitals   08/28/18 1024  BP: 114/75  Pulse: (!) 107  Resp: 16  SpO2: 99%  Weight: 167 lb 3.2 oz (75.8 kg)  Height: 6' (1.829 m)    Physical Exam  Constitutional: He is oriented to person, place, and time. He appears well-developed and well-nourished.  HENT:  Head: Normocephalic and atraumatic.  Nose: Nose normal.  Eyes: Pupils are equal, round, and reactive to light. Conjunctivae and EOM are normal.  Neck: Normal range of motion. Neck supple. No JVD present. No tracheal deviation present. No thyromegaly present.  Cardiovascular: Normal rate, regular rhythm and normal heart sounds.  Pulmonary/Chest: Effort normal and breath sounds normal. He has no wheezes.  Abdominal: Soft. Bowel sounds are normal. There is no tenderness.  Musculoskeletal: Normal range of motion.  Patient currently has full ROM and good strength in both hands.   Neurological: He is alert and oriented to person, place, and time.  Skin: Skin is warm and dry.  Psychiatric: He has a normal mood and affect. His behavior is normal. Judgment and thought content normal.  Nursing note and vitals reviewed.  Assessment/Plan: 1. Bilateral carpal tunnel syndrome Continue to take meloxicam at 15mg  dose daily when needed. May also use OTC tylenol to help reduce pain. Refer to orthopedics as indicated.   2. Attention  and concentration deficit May continue adderall 20mg  twice daily as needed. Three 30 day prescriptions were sent to his pharmacy. Dates are 08/28/2018, 09/26/2018, and 10/24/2018.  - amphetamine-dextroamphetamine (ADDERALL) 20 MG tablet; Take 1 tablet (20 mg total) by mouth 2 (two) times daily as needed (for focus).  Dispense: 60 tablet; Refill: 0  General Counseling: Raudel verbalizes understanding of the findings of todays visit and agrees with plan of treatment. I have discussed any further diagnostic evaluation that may be needed or ordered today. We also reviewed his medications today. he has been encouraged to call the office with any questions or concerns that should arise related to todays visit.   Refilled Controlled medications today. Reviewed risks and possible side effects associated with taking Stimulants. Combination of these drugs with other psychotropic medications could cause dizziness and drowsiness. Pt needs to Monitor symptoms  and exercise caution in driving and operating heavy machinery to avoid damages to oneself, to others and to the surroundings. Patient verbalized understanding in this matter. Dependence and abuse for these drugs will be monitored closely. A Controlled substance policy and procedure is on file which allows [facility] to order a urine drug screen test at any visit. Patient understands and agrees with the plan..  This patient was seen by Vincent Gros FNP Collaboration with Dr Lyndon Code as a part of collaborative care agreement  Meds ordered this encounter  Medications  . DISCONTD: amphetamine-dextroamphetamine (ADDERALL) 20 MG tablet    Sig: Take 1 tablet (20 mg total) by mouth 2 (two) times daily as needed (for focus).    Dispense:  60 tablet    Refill:  0    Order Specific Question:   Supervising Provider    Answer:   Lyndon Code [[id number]]  . DISCONTD: amphetamine-dextroamphetamine (ADDERALL) 20 MG tablet    Sig: Take 1 tablet (20 mg total)  by mouth 2 (two) times daily as needed (for focus).    Dispense:  60 tablet    Refill:  0    Fill after 09/26/2018    Order Specific Question:   Supervising Provider    Answer:   Lyndon Code [[id number]]  . amphetamine-dextroamphetamine (ADDERALL) 20 MG tablet    Sig: Take 1 tablet (20 mg total) by mouth 2 (two) times daily as needed (for focus).    Dispense:  60 tablet    Refill:  0    Fill after 10/24/2018    Order Specific Question:   Supervising Provider    Answer:   Lyndon Code [[id number]]    Time spent: 66 Minutes      Dr Lyndon Code Internal medicine

## 2018-11-19 ENCOUNTER — Encounter: Payer: Self-pay | Admitting: Nurse Practitioner

## 2018-11-19 ENCOUNTER — Ambulatory Visit (INDEPENDENT_AMBULATORY_CARE_PROVIDER_SITE_OTHER): Payer: BC Managed Care – PPO | Admitting: Nurse Practitioner

## 2018-11-19 VITALS — BP 116/72 | HR 104 | Resp 16 | Ht 72.0 in | Wt 173.2 lb

## 2018-11-19 DIAGNOSIS — Z79899 Other long term (current) drug therapy: Secondary | ICD-10-CM | POA: Insufficient documentation

## 2018-11-19 DIAGNOSIS — R5383 Other fatigue: Secondary | ICD-10-CM

## 2018-11-19 DIAGNOSIS — R300 Dysuria: Secondary | ICD-10-CM | POA: Diagnosis not present

## 2018-11-19 DIAGNOSIS — Z0001 Encounter for general adult medical examination with abnormal findings: Secondary | ICD-10-CM | POA: Diagnosis not present

## 2018-11-19 DIAGNOSIS — R41840 Attention and concentration deficit: Secondary | ICD-10-CM

## 2018-11-19 MED ORDER — AMPHETAMINE-DEXTROAMPHETAMINE 20 MG PO TABS: 20.0000 mg | ORAL_TABLET | Freq: Two times a day (BID) | ORAL | 0 refills | Status: DC | PRN

## 2018-11-19 NOTE — Progress Notes (Signed)
[facility] [address] [place], [place] [address]  Internal MEDICINE  Office Visit Note  Patient Name: Kevin ReiningHarold K Duckett Jr.  [id number] 29, 1982  [id number]  Date of Service: 11/19/2018   Pt is here for routine health maintenance examination  Chief Complaint  Patient presents with  . Annual Exam    pt is here for physical and medication refill     The patient is here for annual health maintenance exam. No physical concerns or complaints today. He is due to have routine, fasting labs ordered. Takes adderall 20mg  twice daily when needed to help with focus and concentration. Keeps him on track and productive while at work. No negative side effects to report. Needs to have refills for this today.      Current Medication: Outpatient Encounter Medications as of 11/19/2018  Medication Sig  . amphetamine-dextroamphetamine (ADDERALL) 20 MG tablet Take 1 tablet (20 mg total) by mouth 2 (two) times daily as needed (for focus).  Marland Kitchen. docusate sodium (COLACE) 100 MG capsule Take 100 mg by mouth daily.  . meloxicam (MOBIC) 7.5 MG tablet Take 7.5 mg by mouth as needed for pain.  . Multiple Vitamin (MULTIVITAMIN) capsule Take 1 capsule by mouth daily.  Marland Kitchen. PROCTO-MED HC 2.5 % rectal cream Place 1 application rectally as needed.   . [DISCONTINUED] amphetamine-dextroamphetamine (ADDERALL) 20 MG tablet Take 1 tablet (20 mg total) by mouth 2 (two) times daily as needed (for focus).  . [DISCONTINUED] amphetamine-dextroamphetamine (ADDERALL) 20 MG tablet Take 1 tablet (20 mg total) by mouth 2 (two) times daily as needed (for focus).  . [DISCONTINUED] amphetamine-dextroamphetamine (ADDERALL) 20 MG tablet Take 1 tablet (20 mg total) by mouth 2 (two) times daily as needed (for focus).   No facility-administered encounter medications on file as of 11/19/2018.     Surgical History: Past Surgical History:  Procedure Laterality Date  . ANKLE SURGERY  2016    Medical History: Past Medical History:   Diagnosis Date  . Hemorrhoid     Family History: Family History  Problem Relation Age of Onset  . COPD Father       Review of Systems  Constitutional: Negative for activity change, fatigue and unexpected weight change.  HENT: Negative for congestion, postnasal drip, sinus pressure and sore throat.   Respiratory: Negative for cough, chest tightness and wheezing.   Cardiovascular: Negative for chest pain and palpitations.  Gastrointestinal: Negative for constipation, diarrhea, nausea and vomiting.  Endocrine: Negative for cold intolerance, heat intolerance, polydipsia, polyphagia and polyuria.  Genitourinary: Negative.   Musculoskeletal: Positive for arthralgias.       Bilateral hands.   Skin: Negative for rash.  Allergic/Immunologic: Positive for environmental allergies.  Neurological: Negative for dizziness, weakness and headaches.  Hematological: Negative for adenopathy.  Psychiatric/Behavioral: Positive for decreased concentration and sleep disturbance.       Doing well with current dose of adderall. Takes 20mg  twice daily if needed.      Today's Vitals   11/19/18 1108  BP: 116/72  Pulse: (!) 104  Resp: 16  SpO2: 96%  Weight: 173 lb 3.2 oz (78.6 kg)  Height: 6' (1.829 m)    Physical Exam Vitals signs and nursing note reviewed.  Constitutional:      Appearance: Normal appearance. He is well-developed.  HENT:     Head: Normocephalic and atraumatic.     Nose: Nose normal.  Eyes:     Extraocular Movements: Extraocular movements intact.     Conjunctiva/sclera: Conjunctivae normal.  Pupils: Pupils are equal, round, and reactive to light.  Neck:     Musculoskeletal: Normal range of motion and neck supple.     Thyroid: No thyromegaly.     Vascular: No JVD.     Trachea: No tracheal deviation.  Cardiovascular:     Rate and Rhythm: Normal rate and regular rhythm.     Pulses: Normal pulses.     Heart sounds: Normal heart sounds.  Pulmonary:     Effort:  Pulmonary effort is normal.     Breath sounds: Normal breath sounds. No wheezing.  Abdominal:     General: Bowel sounds are normal.     Palpations: Abdomen is soft.     Tenderness: There is no abdominal tenderness.  Musculoskeletal: Normal range of motion.     Comments: Patient currently has full ROM and good strength in both hands.   Skin:    General: Skin is warm and dry.     Capillary Refill: Capillary refill takes less than 2 seconds.  Neurological:     General: No focal deficit present.     Mental Status: He is alert and oriented to person, place, and time.  Psychiatric:        Behavior: Behavior normal.        Thought Content: Thought content normal.        Judgment: Judgment normal.    Assessment/Plan: 1. Encounter for general adult medical examination with abnormal findings Annual health maintenance exam today. Routine, fasting labs ordered. Lab slip given to patient.   2. Attention and concentration deficit May continue adderall 20mg  twice daily when needed. Three 30 day prescriptions provided. Dates are 11/19/2018, 12/17/2018, and 01/15/2019.  - amphetamine-dextroamphetamine (ADDERALL) 20 MG tablet; Take 1 tablet (20 mg total) by mouth 2 (two) times daily as needed (for focus).  Dispense: 60 tablet; Refill: 0  3. Other fatigue Check CBC and thyroid panal  4. Dysuria - UA/M w/rflx Culture, Routine  General Counseling: Kevin Whitehead verbalizes understanding of the findings of todays visit and agrees with plan of treatment. I have discussed any further diagnostic evaluation that may be needed or ordered today. We also reviewed his medications today. he has been encouraged to call the office with any questions or concerns that should arise related to todays visit.    Counseling:  Refilled Controlled medications today. Reviewed risks and possible side effects associated with taking Stimulants. Combination of these drugs with other psychotropic medications could cause dizziness and  drowsiness. Pt needs to Monitor symptoms and exercise caution in driving and operating heavy machinery to avoid damages to oneself, to others and to the surroundings. Patient verbalized understanding in this matter. Dependence and abuse for these drugs will be monitored closely. A Controlled substance policy and procedure is on file which allows [facility] to order a urine drug screen test at any visit. Patient understands and agrees with the plan..  This patient was seen by Vincent Gros FNP Collaboration with Dr Lyndon Code as a part of collaborative care agreement   Orders Placed This Encounter  Procedures  . UA/M w/rflx Culture, Routine    Meds ordered this encounter  Medications  . DISCONTD: amphetamine-dextroamphetamine (ADDERALL) 20 MG tablet    Sig: Take 1 tablet (20 mg total) by mouth 2 (two) times daily as needed (for focus).    Dispense:  60 tablet    Refill:  0    Order Specific Question:   Supervising Provider    Answer:  KHAN, FOZIA M [[id number]]  . DISCONTD: amphetamine-dextroamphetamine (ADDERALL) 20 MG tablet    Sig: Take 1 tablet (20 mg total) by mouth 2 (two) times daily as needed (for focus).    Dispense:  60 tablet    Refill:  0    Fill after 12/17/2018    Order Specific Question:   Supervising Provider    Answer:   Lyndon Code [[id number]]  . amphetamine-dextroamphetamine (ADDERALL) 20 MG tablet    Sig: Take 1 tablet (20 mg total) by mouth 2 (two) times daily as needed (for focus).    Dispense:  60 tablet    Refill:  0    Fill after 01/15/2019    Order Specific Question:   Supervising Provider    Answer:   Lyndon Code [[id number]]    Time spent: 62 Minutes      Lyndon Code, MD  Internal Medicine

## 2018-11-20 LAB — UA/M W/RFLX CULTURE, ROUTINE
Bilirubin, UA: NEGATIVE
Glucose, UA: NEGATIVE
Ketones, UA: NEGATIVE
Leukocytes, UA: NEGATIVE
NITRITE UA: NEGATIVE
PH UA: 6.5 (ref 5.0–7.5)
Protein, UA: NEGATIVE
RBC UA: NEGATIVE
Specific Gravity, UA: 1.022 (ref 1.005–1.030)
Urobilinogen, Ur: 0.2 mg/dL (ref 0.2–1.0)

## 2018-11-20 LAB — MICROSCOPIC EXAMINATION
BACTERIA UA: NONE SEEN
Casts: NONE SEEN /lpf
Epithelial Cells (non renal): NONE SEEN /hpf (ref 0–10)

## 2018-12-09 ENCOUNTER — Other Ambulatory Visit: Payer: Self-pay | Admitting: Nurse Practitioner

## 2018-12-09 DIAGNOSIS — Z0001 Encounter for general adult medical examination with abnormal findings: Secondary | ICD-10-CM | POA: Diagnosis not present

## 2018-12-09 DIAGNOSIS — E756 Lipid storage disorder, unspecified: Secondary | ICD-10-CM | POA: Diagnosis not present

## 2018-12-09 DIAGNOSIS — E079 Disorder of thyroid, unspecified: Secondary | ICD-10-CM | POA: Diagnosis not present

## 2018-12-09 DIAGNOSIS — E0781 Sick-euthyroid syndrome: Secondary | ICD-10-CM | POA: Diagnosis not present

## 2018-12-09 DIAGNOSIS — R5383 Other fatigue: Secondary | ICD-10-CM | POA: Diagnosis not present

## 2018-12-09 DIAGNOSIS — D691 Qualitative platelet defects: Secondary | ICD-10-CM | POA: Diagnosis not present

## 2018-12-10 LAB — BASIC METABOLIC PANEL
BUN/Creatinine Ratio: 20 (ref 9–20)
BUN: 18 mg/dL (ref 6–20)
CO2: 23 mmol/L (ref 20–29)
Calcium: 9.4 mg/dL (ref 8.7–10.2)
Chloride: 99 mmol/L (ref 96–106)
Creatinine, Ser: 0.9 mg/dL (ref 0.76–1.27)
GFR calc Af Amer: 124 mL/min/{1.73_m2} (ref >59)
GFR calc non Af Amer: 107 mL/min/{1.73_m2} (ref >59)
Glucose: 106 mg/dL — ABNORMAL HIGH (ref 65–99)
Potassium: 4.2 mmol/L (ref 3.5–5.2)
Sodium: 138 mmol/L (ref 134–144)

## 2018-12-10 LAB — CBC
Hematocrit: 38.7 % (ref 37.5–51.0)
Hemoglobin: 13.7 g/dL (ref 13.0–17.7)
MCH: 31.3 pg (ref 26.6–33.0)
MCHC: 35.4 g/dL (ref 31.5–35.7)
MCV: 88 fL (ref 79–97)
Platelets: 232 10*3/uL (ref 150–450)
RBC: 4.38 x10E6/uL (ref 4.14–5.80)
RDW: 12 % (ref 11.6–15.4)
WBC: 4 10*3/uL (ref 3.4–10.8)

## 2018-12-10 LAB — LIPID PANEL W/O CHOL/HDL RATIO
Cholesterol, Total: 145 mg/dL (ref 100–199)
HDL: 59 mg/dL (ref >39)
LDL CALC: 78 mg/dL (ref 0–99)
Triglycerides: 42 mg/dL (ref 0–149)
VLDL Cholesterol Cal: 8 mg/dL (ref 5–40)

## 2018-12-10 LAB — TSH: TSH: 0.685 u[IU]/mL (ref 0.450–4.500)

## 2018-12-10 LAB — T4, FREE: Free T4: 1.39 ng/dL (ref 0.82–1.77)

## 2019-02-16 ENCOUNTER — Other Ambulatory Visit: Payer: Self-pay

## 2019-02-16 ENCOUNTER — Encounter: Payer: Self-pay | Admitting: Nurse Practitioner

## 2019-02-16 ENCOUNTER — Ambulatory Visit: Payer: BC Managed Care – PPO | Admitting: Nurse Practitioner

## 2019-02-16 VITALS — BP 105/75 | HR 86 | Resp 16 | Ht 72.0 in | Wt 171.0 lb

## 2019-02-16 DIAGNOSIS — F331 Major depressive disorder, recurrent, moderate: Secondary | ICD-10-CM | POA: Diagnosis not present

## 2019-02-16 DIAGNOSIS — R41840 Attention and concentration deficit: Secondary | ICD-10-CM

## 2019-02-16 DIAGNOSIS — R5383 Other fatigue: Secondary | ICD-10-CM | POA: Diagnosis not present

## 2019-02-16 MED ORDER — BUPROPION HCL ER (SR) 100 MG PO TB12: 100.0000 mg | ORAL_TABLET | Freq: Two times a day (BID) | ORAL | 3 refills | Status: DC

## 2019-02-16 MED ORDER — AMPHETAMINE-DEXTROAMPHETAMINE 10 MG PO TABS: 20.0000 mg | ORAL_TABLET | Freq: Two times a day (BID) | ORAL | 0 refills | Status: DC | PRN
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
## 2019-03-15 ENCOUNTER — Ambulatory Visit: Payer: BC Managed Care – PPO | Admitting: Nurse Practitioner

## 2019-03-15 ENCOUNTER — Encounter: Payer: Self-pay | Admitting: Nurse Practitioner

## 2019-03-15 ENCOUNTER — Other Ambulatory Visit: Payer: Self-pay

## 2019-03-15 VITALS — Ht 72.0 in | Wt 165.0 lb

## 2019-03-15 DIAGNOSIS — R41840 Attention and concentration deficit: Secondary | ICD-10-CM

## 2019-03-15 DIAGNOSIS — F331 Major depressive disorder, recurrent, moderate: Secondary | ICD-10-CM | POA: Diagnosis not present

## 2019-03-15 DIAGNOSIS — R5383 Other fatigue: Secondary | ICD-10-CM | POA: Diagnosis not present

## 2019-03-15 MED ORDER — BUPROPION HCL ER (SR) 100 MG PO TB12: 100.0000 mg | ORAL_TABLET | Freq: Two times a day (BID) | ORAL | 3 refills | Status: DC

## 2019-03-15 MED ORDER — AMPHETAMINE-DEXTROAMPHETAMINE 20 MG PO TABS: 20.0000 mg | ORAL_TABLET | Freq: Two times a day (BID) | ORAL | 0 refills | Status: DC | PRN

## 2019-03-15 NOTE — Progress Notes (Signed)
[facility] [address] [place], [place] [address]  Internal MEDICINE  Telephone Visit  Patient Name: Kevin Whitehead  [id number]  [id number]  Date of Service: 03/15/2019  I connected with the patient at 9:32am by webcam and verified the patients identity using two identifiers.   I discussed the limitations, risks, security and privacy concerns of performing an evaluation and management service by webcam and the availability of in person appointments. I also discussed with the patient that there may be a patient responsible charge related to the service.  The patient expressed understanding and agrees to proceed.    Chief Complaint  Patient presents with  . Telephone Screen    VIDEO VISIT [phone]  . Telephone Assessment  . Medical Management of Chronic Issues    4wk follow up added wellbutrin,   . Depression    The patient has been contacted via webcam for follow up visit due to concerns for spread of novel coronavirus. The patient states that he has had increased depression and anxiety recently. Was started on wellbutrin  daily, has not increased to twice daily. When started, I did reduce his adderall to . He states that his depression and anxiety have improved. His wife reports improvement in symptoms and irritability at home. He has noted increased fatigue. Finds hiself nodding off very easily if sitting still for just a few minutes. He states that his son is doing better and will be coming home from the hospital this week. The patient is trying to ready his home for his son's return. He will be in a wheelchair with two broken legs and broken arm. Physical and occupational therapy will be coming to the home at least three times a week. The patient will be expected to help his son with exercises on the alternating days. The patient does continue to work full time.       Current Medication: Outpatient Encounter Medications as of 03/15/2019  Medication Sig   . amphetamine-dextroamphetamine (ADDERALL) 20 MG tablet Take 1 tablet (20 mg total) by mouth 2 (two) times daily as needed (for focus).  Marland Kitchen buPROPion (WELLBUTRIN SR) 100 MG 12 hr tablet Take 1 tablet (100 mg total) by mouth 2 (two) times daily.  Marland Kitchen docusate sodium (COLACE) 100 MG capsule Take 100 mg by mouth daily.  . meloxicam (MOBIC) 7.5 MG tablet Take 7.5 mg by mouth as needed for pain.  . Multiple Vitamin (MULTIVITAMIN) capsule Take 1 capsule by mouth daily.  Marland Kitchen PROCTO-MED HC 2.5 % rectal cream Place 1 application rectally as needed.   . [DISCONTINUED] amphetamine-dextroamphetamine (ADDERALL) 10 MG tablet Take 2 tablets (20 mg total) by mouth 2 (two) times daily as needed (for focus).  . [DISCONTINUED] amphetamine-dextroamphetamine (ADDERALL) 20 MG tablet Take 1 tablet (20 mg total) by mouth 2 (two) times daily as needed (for focus).  . [DISCONTINUED] amphetamine-dextroamphetamine (ADDERALL) 20 MG tablet Take 1 tablet (20 mg total) by mouth 2 (two) times daily as needed (for focus).  . [DISCONTINUED] buPROPion (WELLBUTRIN SR) 100 MG 12 hr tablet Take 1 tablet (100 mg total) by mouth 2 (two) times daily.   No facility-administered encounter medications on file as of 03/15/2019.     Surgical History: Past Surgical History:  Procedure Laterality Date  . ANKLE SURGERY  2016    Medical History: Past Medical History:  Diagnosis Date  . Hemorrhoid     Family History: Family History  Problem Relation Age of Onset  . COPD Father  Social History   Socioeconomic History  . Marital status: Married    Spouse name: Not on file  . Number of children: Not on file  . Years of education: Not on file  . Highest education level: Not on file  Occupational History  . Not on file  Social Needs  . Financial resource strain: Not on file  . Food insecurity:    Worry: Not on file    Inability: Not on file  . Transportation needs:    Medical: Not on file    Non-medical: Not on file   Tobacco Use  . Smoking status: Former Smoker    Types: Cigarettes    Last attempt to quit: 2017    Years since quitting: 3.3  . Smokeless tobacco: Never Used  Substance and Sexual Activity  . Alcohol use: No  . Drug use: No  . Sexual activity: Not on file  Lifestyle  . Physical activity:    Days per week: Not on file    Minutes per session: Not on file  . Stress: Not on file  Relationships  . Social connections:    Talks on phone: Not on file    Gets together: Not on file    Attends religious service: Not on file    Active member of club or organization: Not on file    Attends meetings of clubs or organizations: Not on file    Relationship status: Not on file  . Intimate partner violence:    Fear of current or ex partner: Not on file    Emotionally abused: Not on file    Physically abused: Not on file    Forced sexual activity: Not on file  Other Topics Concern  . Not on file  Social History Narrative  . Not on file      Review of Systems  Constitutional: Positive for fatigue. Negative for activity change and unexpected weight change.  HENT: Negative for congestion, postnasal drip, sinus pressure and sore throat.   Respiratory: Negative for cough, chest tightness and wheezing.   Cardiovascular: Negative for chest pain and palpitations.  Gastrointestinal: Negative for constipation, diarrhea, nausea and vomiting.  Endocrine: Negative for cold intolerance, heat intolerance, polydipsia and polyuria.  Skin: Negative for rash.  Allergic/Immunologic: Positive for environmental allergies.  Neurological: Negative for dizziness, weakness and headaches.  Hematological: Negative for adenopathy.  Psychiatric/Behavioral: Positive for decreased concentration and dysphoric mood. The patient is nervous/anxious.        Depression had worsened over past several days, as son is in trauma ICU at [facility] and he is unable to visit him. .    Today's Vitals   03/15/19 0921  Weight: 165 lb  (74.8 kg)  Height: 6' (1.829 m)   Body mass index is 22.38 kg/m.  Observation/Objective:  The patient is alert and oriented. Appears to be tired, but otherwise well appearing. He is in no acute distress.    Assessment/Plan: 1. Other fatigue Somewhat more severe than last visit. Likely due to change in medication dosing.   2. Attention and concentration deficit Increase adderall back to 20mg  twice daily as needed. Three 30 day prescriptions provided today. Dates are 03/15/2019, 04/12/2019, and 05/11/2019 - amphetamine-dextroamphetamine (ADDERALL) 20 MG tablet; Take 1 tablet (20 mg total) by mouth 2 (two) times daily as needed (for focus).  Dispense: 60 tablet; Refill: 0  3. Moderate episode of recurrent major depressive disorder (HCC) Improving. Will have patient increase dosing of wellbutrin SR 100mg  to twice daily.  Reassess at next visit.  - buPROPion (WELLBUTRIN SR) 100 MG 12 hr tablet; Take 1 tablet (100 mg total) by mouth 2 (two) times daily.  Dispense: 60 tablet; Refill: 3  General Counseling: Kevin Whitehead verbalizes understanding of the findings of today's phone visit and agrees with plan of treatment. I have discussed any further diagnostic evaluation that may be needed or ordered today. We also reviewed his medications today. he has been encouraged to call the office with any questions or concerns that should arise related to todays visit.  Refilled Controlled medications today. Reviewed risks and possible side effects associated with taking Stimulants. Combination of these drugs with other psychotropic medications could cause dizziness and drowsiness. Pt needs to Monitor symptoms and exercise caution in driving and operating heavy machinery to avoid damages to oneself, to others and to the surroundings. Patient verbalized understanding in this matter. Dependence and abuse for these drugs will be monitored closely. A Controlled substance policy and procedure is on file which allows [facility] to order a urine drug screen test at any visit. Patient understands and agrees with the plan..  This patient was seen by Vincent GrosHeather Ruchi Stoney FNP Collaboration with Dr Lyndon CodeFozia M Khan as a part of collaborative care agreement  Meds ordered this encounter  Medications  . DISCONTD: amphetamine-dextroamphetamine (ADDERALL) 20 MG tablet    Sig: Take 1 tablet (20 mg total) by mouth 2 (two) times daily as needed (for focus).    Dispense:  60 tablet    Refill:  0    Order Specific Question:   Supervising Provider    Answer:   Lyndon CodeKHAN, FOZIA M [[id number]]  . buPROPion (WELLBUTRIN SR) 100 MG 12 hr tablet    Sig: Take 1 tablet (100 mg total) by mouth 2 (two) times daily.    Dispense:  60 tablet    Refill:  3    Patient should have 60 tablets for twice daily dosing as needed. Thanks.    Order Specific Question:   Supervising Provider    Answer:   Lyndon CodeKHAN, FOZIA M [[id number]]  . DISCONTD: amphetamine-dextroamphetamine (ADDERALL) 20 MG tablet    Sig: Take 1 tablet (20 mg total) by mouth 2 (two) times daily as needed (for focus).    Dispense:  60 tablet    Refill:  0    Fill after 04/12/2019    Order Specific Question:   Supervising Provider    Answer:   Lyndon CodeKHAN, FOZIA M [[id number]]  . amphetamine-dextroamphetamine (ADDERALL) 20 MG tablet    Sig: Take 1 tablet (20 mg total) by mouth 2 (two) times daily as needed (for focus).    Dispense:  60 tablet    Refill:  0    Fill after 05/11/2019    Order Specific Question:   Supervising Provider    Answer:   Lyndon CodeKHAN, FOZIA M [[id number]]    Time spent: 8220 Minutes    Dr Lyndon CodeFozia M Khan Internal medicine

## 2019-06-17 ENCOUNTER — Encounter: Payer: Self-pay | Admitting: Nurse Practitioner

## 2019-06-17 ENCOUNTER — Ambulatory Visit: Payer: BC Managed Care – PPO | Admitting: Nurse Practitioner

## 2019-06-17 ENCOUNTER — Other Ambulatory Visit: Payer: Self-pay

## 2019-06-17 VITALS — BP 101/66 | HR 93 | Temp 98.0°F | Resp 16 | Ht 72.0 in | Wt 172.2 lb

## 2019-06-17 DIAGNOSIS — F331 Major depressive disorder, recurrent, moderate: Secondary | ICD-10-CM

## 2019-06-17 DIAGNOSIS — Z79899 Other long term (current) drug therapy: Secondary | ICD-10-CM | POA: Diagnosis not present

## 2019-06-17 DIAGNOSIS — R41840 Attention and concentration deficit: Secondary | ICD-10-CM

## 2019-06-17 DIAGNOSIS — G5603 Carpal tunnel syndrome, bilateral upper limbs: Secondary | ICD-10-CM | POA: Diagnosis not present

## 2019-06-17 DIAGNOSIS — R5383 Other fatigue: Secondary | ICD-10-CM

## 2019-06-17 LAB — POCT URINE DRUG SCREEN
POC Amphetamine UR: POSITIVE — AB
POC BENZODIAZEPINES UR: NOT DETECTED
POC Barbiturate UR: NOT DETECTED
POC Cocaine UR: NOT DETECTED
POC Ecstasy UR: NOT DETECTED
POC Marijuana UR: NOT DETECTED
POC Methadone UR: NOT DETECTED
POC Methamphetamine UR: NOT DETECTED
POC Opiate Ur: NOT DETECTED
POC Oxycodone UR: POSITIVE — AB
POC PHENCYCLIDINE UR: NOT DETECTED
POC TRICYCLICS UR: NOT DETECTED

## 2019-06-17 MED ORDER — BUPROPION HCL ER (SR) 100 MG PO TB12: 100.0000 mg | ORAL_TABLET | Freq: Two times a day (BID) | ORAL | 3 refills | Status: DC

## 2019-06-17 MED ORDER — AMPHETAMINE-DEXTROAMPHETAMINE 20 MG PO TABS: 20.0000 mg | ORAL_TABLET | Freq: Two times a day (BID) | ORAL | 0 refills | Status: DC | PRN

## 2019-06-17 NOTE — Progress Notes (Signed)
[facility] [address] [place], [place] [address]  Internal MEDICINE  Office Visit Note  Patient Name: Kevin ReiningHarold K Rickett Jr.  [id number]  [id number]  Date of Service: 06/17/2019  Chief Complaint  Patient presents with  . Medical Management of Chronic Issues  . ADD  . Hypotension    pt states that bp usually runs low    The patient is here for routine follow up visit. He has complaint of low blood pressure readings. States that he has noted that his blood pressure is always a little on the low side. Has no dizziness or headaches related to this. He is doing well on current dosing of wellbutrin. Adderall helping to keep him focused aon on track at work. He is due to have refills for these today.        Current Medication: Outpatient Encounter Medications as of 06/17/2019  Medication Sig  . amphetamine-dextroamphetamine (ADDERALL) 20 MG tablet Take 1 tablet (20 mg total) by mouth 2 (two) times daily as needed (for focus).  Marland Kitchen. buPROPion (WELLBUTRIN SR) 100 MG 12 hr tablet Take 1 tablet (100 mg total) by mouth 2 (two) times daily.  Marland Kitchen. docusate sodium (COLACE) 100 MG capsule Take 100 mg by mouth daily.  . meloxicam (MOBIC) 7.5 MG tablet Take 7.5 mg by mouth as needed for pain.  . Multiple Vitamin (MULTIVITAMIN) capsule Take 1 capsule by mouth daily.  Marland Kitchen. PROCTO-MED HC 2.5 % rectal cream Place 1 application rectally as needed.   . [DISCONTINUED] amphetamine-dextroamphetamine (ADDERALL) 20 MG tablet Take 1 tablet (20 mg total) by mouth 2 (two) times daily as needed (for focus).  . [DISCONTINUED] amphetamine-dextroamphetamine (ADDERALL) 20 MG tablet Take 1 tablet (20 mg total) by mouth 2 (two) times daily as needed (for focus).  . [DISCONTINUED] amphetamine-dextroamphetamine (ADDERALL) 20 MG tablet Take 1 tablet (20 mg total) by mouth 2 (two) times daily as needed (for focus).  . [DISCONTINUED] buPROPion (WELLBUTRIN SR) 100 MG 12 hr tablet Take 1 tablet (100 mg total) by mouth 2  (two) times daily.   No facility-administered encounter medications on file as of 06/17/2019.     Surgical History: Past Surgical History:  Procedure Laterality Date  . ANKLE SURGERY  2016    Medical History: Past Medical History:  Diagnosis Date  . Hemorrhoid     Family History: Family History  Problem Relation Age of Onset  . COPD Father     Social History   Socioeconomic History  . Marital status: Married    Spouse name: Not on file  . Number of children: Not on file  . Years of education: Not on file  . Highest education level: Not on file  Occupational History  . Not on file  Social Needs  . Financial resource strain: Not on file  . Food insecurity    Worry: Not on file    Inability: Not on file  . Transportation needs    Medical: Not on file    Non-medical: Not on file  Tobacco Use  . Smoking status: Former Smoker    Types: Cigarettes    Quit date: 2017    Years since quitting: 3.5  . Smokeless tobacco: Never Used  Substance and Sexual Activity  . Alcohol use: No  . Drug use: No  . Sexual activity: Not on file  Lifestyle  . Physical activity    Days per week: Not on file    Minutes per session: Not on file  . Stress: Not  on file  Relationships  . Social Herbalist on phone: Not on file    Gets together: Not on file    Attends religious service: Not on file    Active member of club or organization: Not on file    Attends meetings of clubs or organizations: Not on file    Relationship status: Not on file  . Intimate partner violence    Fear of current or ex partner: Not on file    Emotionally abused: Not on file    Physically abused: Not on file    Forced sexual activity: Not on file  Other Topics Concern  . Not on file  Social History Narrative  . Not on file      Review of Systems  Constitutional: Negative for activity change, fatigue and unexpected weight change.  HENT: Negative for congestion, postnasal drip, sinus  pressure and sore throat.   Respiratory: Negative for cough, chest tightness and wheezing.   Cardiovascular: Negative for chest pain and palpitations.  Gastrointestinal: Negative for constipation, diarrhea, nausea and vomiting.  Endocrine: Negative for cold intolerance, heat intolerance, polydipsia and polyuria.  Musculoskeletal: Positive for myalgias.  Skin: Negative for rash.  Allergic/Immunologic: Negative for environmental allergies.  Neurological: Negative for dizziness, weakness and headaches.  Hematological: Negative for adenopathy.  Psychiatric/Behavioral: Positive for decreased concentration and dysphoric mood. The patient is nervous/anxious.        Improved on current dose of wellbutrin. Doing well with current dosing of adderall.    Today's Vitals   06/17/19 0952  BP: 101/66  Pulse: 93  Resp: 16  Temp: 98 F (36.7 C)  SpO2: 98%  Weight: 172 lb 3.2 oz (78.1 kg)  Height: 6' (1.829 m)   Body mass index is 23.35 kg/m.  Physical Exam Vitals signs and nursing note reviewed.  Constitutional:      Appearance: Normal appearance. He is well-developed.  HENT:     Head: Normocephalic and atraumatic.     Nose: Nose normal.  Eyes:     Extraocular Movements: Extraocular movements intact.     Conjunctiva/sclera: Conjunctivae normal.     Pupils: Pupils are equal, round, and reactive to light.  Neck:     Musculoskeletal: Normal range of motion and neck supple.     Thyroid: No thyromegaly.     Vascular: No JVD.     Trachea: No tracheal deviation.  Cardiovascular:     Rate and Rhythm: Normal rate and regular rhythm.     Pulses: Normal pulses.     Heart sounds: Normal heart sounds.  Pulmonary:     Effort: Pulmonary effort is normal.     Breath sounds: Normal breath sounds. No wheezing.  Abdominal:     General: Bowel sounds are normal.     Palpations: Abdomen is soft.     Tenderness: There is no abdominal tenderness.  Musculoskeletal: Normal range of motion.     Comments:  Patient currently has full ROM and good strength in both hands.   Skin:    General: Skin is warm and dry.     Capillary Refill: Capillary refill takes less than 2 seconds.  Neurological:     General: No focal deficit present.     Mental Status: He is alert and oriented to person, place, and time.  Psychiatric:        Attention and Perception: Attention and perception normal.        Mood and Affect: Affect normal. Mood is depressed.  Speech: Speech normal.        Behavior: Behavior normal. Behavior is cooperative.        Thought Content: Thought content normal.        Cognition and Memory: Cognition and memory normal.        Judgment: Judgment normal.    Assessment/Plan: 1. Other fatigue In general, no changes. Will continue to monitor   2. Bilateral carpal tunnel syndrome Use OTC NSAIDs or tylenol as needed and as indicated.   3. Attention and concentration deficit May continue to take adderall 20mg  twice daily to help with focus and concentration. Three 30 day prescriptions sent to his pharmacy. Dates are 06/17/2019, 07/16/2019, and 08/14/2019 - amphetamine-dextroamphetamine (ADDERALL) 20 MG tablet; Take 1 tablet (20 mg total) by mouth 2 (two) times daily as needed (for focus).  Dispense: 60 tablet; Refill: 0  4. Moderate episode of recurrent major depressive disorder (HCC) Improved. Continue wellbutrin 100mg  twice daily.  - buPROPion (WELLBUTRIN SR) 100 MG 12 hr tablet; Take 1 tablet (100 mg total) by mouth 2 (two) times daily.  Dispense: 60 tablet; Refill: 3  5. Encounter for long-term (current) use of medications - POCT Urine Drug Screen  General Counseling: Jake SharkHarold verbalizes understanding of the findings of todays visit and agrees with plan of treatment. I have discussed any further diagnostic evaluation that may be needed or ordered today. We also reviewed his medications today. he has been encouraged to call the office with any questions or concerns that should arise  related to todays visit.  Refilled Controlled medications today. Reviewed risks and possible side effects associated with taking Stimulants. Combination of these drugs with other psychotropic medications could cause dizziness and drowsiness. Pt needs to Monitor symptoms and exercise caution in driving and operating heavy machinery to avoid damages to oneself, to others and to the surroundings. Patient verbalized understanding in this matter. Dependence and abuse for these drugs will be monitored closely. A Controlled substance policy and procedure is on file which allows [facility] to order a urine drug screen test at any visit. Patient understands and agrees with the plan..  This patient was seen by Vincent GrosHeather Terrius Gentile FNP Collaboration with Dr Lyndon CodeFozia M Khan as a part of collaborative care agreement  Orders Placed This Encounter  Procedures  . POCT Urine Drug Screen    Meds ordered this encounter  Medications  . DISCONTD: amphetamine-dextroamphetamine (ADDERALL) 20 MG tablet    Sig: Take 1 tablet (20 mg total) by mouth 2 (two) times daily as needed (for focus).    Dispense:  60 tablet    Refill:  0    Order Specific Question:   Supervising Provider    Answer:   Lyndon CodeKHAN, FOZIA M [[id number]]  . buPROPion (WELLBUTRIN SR) 100 MG 12 hr tablet    Sig: Take 1 tablet (100 mg total) by mouth 2 (two) times daily.    Dispense:  60 tablet    Refill:  3    Patient should have 60 tablets for twice daily dosing as needed. Thanks.    Order Specific Question:   Supervising Provider    Answer:   Lyndon CodeKHAN, FOZIA M [[id number]]  . DISCONTD: amphetamine-dextroamphetamine (ADDERALL) 20 MG tablet    Sig: Take 1 tablet (20 mg total) by mouth 2 (two) times daily as needed (for focus).    Dispense:  60 tablet    Refill:  0    Fill after 07/16/2019    Order Specific Question:   Supervising Provider  Answer:   Lyndon CodeKHAN, FOZIA M [[id number]]  . amphetamine-dextroamphetamine (ADDERALL) 20 MG tablet    Sig: Take 1 tablet (20 mg  total) by mouth 2 (two) times daily as needed (for focus).    Dispense:  60 tablet    Refill:  0    Fill after 08/14/2019    Order Specific Question:   Supervising Provider    Answer:   Lyndon CodeKHAN, FOZIA M [[id number]]    Time spent: 25 Minutes      Dr Lyndon CodeFozia M Khan Internal medicine

## 2019-09-08 ENCOUNTER — Other Ambulatory Visit: Payer: Self-pay | Admitting: Nurse Practitioner

## 2019-09-08 ENCOUNTER — Telehealth: Payer: Self-pay | Admitting: Nurse Practitioner

## 2019-09-08 DIAGNOSIS — R41840 Attention and concentration deficit: Secondary | ICD-10-CM

## 2019-09-08 MED ORDER — AMPHETAMINE-DEXTROAMPHETAMINE 20 MG PO TABS: 20.0000 mg | ORAL_TABLET | Freq: Two times a day (BID) | ORAL | 0 refills | Status: DC | PRN

## 2019-09-08 NOTE — Progress Notes (Signed)
Approved single 30 day prescription for adderall and sent to his pharmacy today.

## 2019-09-08 NOTE — Telephone Encounter (Signed)
Approved single 30 day prescription for adderall and sent to his pharmacy today.

## 2019-09-17 ENCOUNTER — Other Ambulatory Visit: Payer: Self-pay

## 2019-09-17 ENCOUNTER — Ambulatory Visit: Payer: BC Managed Care – PPO | Admitting: Nurse Practitioner

## 2019-09-17 ENCOUNTER — Encounter: Payer: Self-pay | Admitting: Nurse Practitioner

## 2019-09-17 VITALS — BP 103/76 | HR 90 | Temp 98.1°F | Resp 16 | Ht 72.0 in | Wt 170.0 lb

## 2019-09-17 DIAGNOSIS — R41840 Attention and concentration deficit: Secondary | ICD-10-CM

## 2019-09-17 DIAGNOSIS — M7918 Myalgia, other site: Secondary | ICD-10-CM

## 2019-09-17 DIAGNOSIS — F331 Major depressive disorder, recurrent, moderate: Secondary | ICD-10-CM | POA: Diagnosis not present

## 2019-09-17 MED ORDER — AMPHETAMINE-DEXTROAMPHETAMINE 20 MG PO TABS: 20.0000 mg | ORAL_TABLET | Freq: Two times a day (BID) | ORAL | 0 refills | Status: DC | PRN

## 2019-09-17 MED ORDER — BUPROPION HCL ER (SR) 100 MG PO TB12: 100.0000 mg | ORAL_TABLET | Freq: Two times a day (BID) | ORAL | 3 refills | Status: DC

## 2019-09-17 NOTE — Progress Notes (Signed)
[facility] [address] [place], [place] [address]  Internal MEDICINE  Office Visit Note  Patient Name: Kevin ReiningHarold K Yadao Jr.  [id number]  [id number]  Date of Service: 09/17/2019  Chief Complaint  Patient presents with  . ADD  . Medication Refill    adderall and wellbutrin    The patient is here for routine follow up. He is complaining of low back pain on the right side, just above the hip. Has been going on for about a month. Exertion makes it worse. Taking antiinflammatories does help. No trauma or injury that he can remember. Strength and ROM are not affected by this. He continues to do well with current dose wellbutrin. Generally takes daily, but sometimes, takes twice daily. Doing well on adderall 20mg  twice daily when needed. He does need refills for these medications today.        Current Medication: Outpatient Encounter Medications as of 09/17/2019  Medication Sig  . amphetamine-dextroamphetamine (ADDERALL) 20 MG tablet Take 1 tablet (20 mg total) by mouth 2 (two) times daily as needed (for focus).  Marland Kitchen. buPROPion (WELLBUTRIN SR) 100 MG 12 hr tablet Take 1 tablet (100 mg total) by mouth 2 (two) times daily.  Marland Kitchen. docusate sodium (COLACE) 100 MG capsule Take 100 mg by mouth daily.  . meloxicam (MOBIC) 7.5 MG tablet Take 7.5 mg by mouth as needed for pain.  . Multiple Vitamin (MULTIVITAMIN) capsule Take 1 capsule by mouth daily.  Marland Kitchen. PROCTO-MED HC 2.5 % rectal cream Place 1 application rectally as needed.   . [DISCONTINUED] amphetamine-dextroamphetamine (ADDERALL) 20 MG tablet Take 1 tablet (20 mg total) by mouth 2 (two) times daily as needed (for focus).  . [DISCONTINUED] amphetamine-dextroamphetamine (ADDERALL) 20 MG tablet Take 1 tablet (20 mg total) by mouth 2 (two) times daily as needed (for focus).  . [DISCONTINUED] amphetamine-dextroamphetamine (ADDERALL) 20 MG tablet Take 1 tablet (20 mg total) by mouth 2 (two) times daily as needed (for focus).  . [DISCONTINUED]  buPROPion (WELLBUTRIN SR) 100 MG 12 hr tablet Take 1 tablet (100 mg total) by mouth 2 (two) times daily.   No facility-administered encounter medications on file as of 09/17/2019.     Surgical History: Past Surgical History:  Procedure Laterality Date  . ANKLE SURGERY  2016    Medical History: Past Medical History:  Diagnosis Date  . Hemorrhoid     Family History: Family History  Problem Relation Age of Onset  . COPD Father     Social History   Socioeconomic History  . Marital status: Married    Spouse name: Not on file  . Number of children: Not on file  . Years of education: Not on file  . Highest education level: Not on file  Occupational History  . Not on file  Social Needs  . Financial resource strain: Not on file  . Food insecurity    Worry: Not on file    Inability: Not on file  . Transportation needs    Medical: Not on file    Non-medical: Not on file  Tobacco Use  . Smoking status: Former Smoker    Types: Cigarettes    Quit date: 2017    Years since quitting: 3.8  . Smokeless tobacco: Never Used  Substance and Sexual Activity  . Alcohol use: No  . Drug use: No  . Sexual activity: Not on file  Lifestyle  . Physical activity    Days per week: Not on file    Minutes per  session: Not on file  . Stress: Not on file  Relationships  . Social Herbalist on phone: Not on file    Gets together: Not on file    Attends religious service: Not on file    Active member of club or organization: Not on file    Attends meetings of clubs or organizations: Not on file    Relationship status: Not on file  . Intimate partner violence    Fear of current or ex partner: Not on file    Emotionally abused: Not on file    Physically abused: Not on file    Forced sexual activity: Not on file  Other Topics Concern  . Not on file  Social History Narrative  . Not on file      Review of Systems  Constitutional: Negative for activity change, fatigue and  unexpected weight change.  HENT: Negative for congestion, postnasal drip, sinus pressure and sore throat.   Respiratory: Negative for cough, chest tightness and wheezing.   Cardiovascular: Negative for chest pain and palpitations.  Gastrointestinal: Negative for constipation, diarrhea, nausea and vomiting.  Endocrine: Negative for cold intolerance, heat intolerance, polydipsia and polyuria.  Musculoskeletal: Positive for back pain and myalgias.  Skin: Negative for rash.  Allergic/Immunologic: Negative for environmental allergies.  Neurological: Negative for dizziness, weakness and headaches.  Hematological: Negative for adenopathy.  Psychiatric/Behavioral: Positive for decreased concentration and dysphoric mood. The patient is nervous/anxious.        Improved on current dose of wellbutrin. Doing well with current dosing of adderall.     Today's Vitals   09/17/19 0907  BP: 103/76  Pulse: 90  Resp: 16  Temp: 98.1 F (36.7 C)  SpO2: 98%  Weight: 170 lb (77.1 kg)  Height: 6' (1.829 m)   Body mass index is 23.06 kg/m.  Physical Exam Vitals signs and nursing note reviewed.  Constitutional:      Appearance: Normal appearance. He is well-developed.  HENT:     Head: Normocephalic and atraumatic.     Nose: Nose normal.  Eyes:     Extraocular Movements: Extraocular movements intact.     Conjunctiva/sclera: Conjunctivae normal.     Pupils: Pupils are equal, round, and reactive to light.  Neck:     Musculoskeletal: Normal range of motion and neck supple.     Thyroid: No thyromegaly.     Vascular: No JVD.     Trachea: No tracheal deviation.  Cardiovascular:     Rate and Rhythm: Normal rate and regular rhythm.     Heart sounds: Normal heart sounds.  Pulmonary:     Effort: Pulmonary effort is normal.     Breath sounds: Normal breath sounds. No wheezing.  Abdominal:     General: Bowel sounds are normal.     Palpations: Abdomen is soft.     Tenderness: There is no abdominal  tenderness.  Musculoskeletal: Normal range of motion.     Comments: Mild right lower back pain, along iliac crest. No palpable abnormalities or deformities. Patient able to bend and twist at the waist without difficulty.   Skin:    General: Skin is warm and dry.     Capillary Refill: Capillary refill takes less than 2 seconds.  Neurological:     General: No focal deficit present.     Mental Status: He is alert and oriented to person, place, and time.  Psychiatric:        Attention and Perception: Attention and perception  normal.        Mood and Affect: Affect normal. Mood is depressed.        Speech: Speech normal.        Behavior: Behavior normal. Behavior is cooperative.        Thought Content: Thought content normal.        Cognition and Memory: Cognition and memory normal.        Judgment: Judgment normal.    Assessment/Plan: 1. Muscle pain, lumbar Encouraged him to take OTC ibuprofen or aleve as needed and as indicated to reduce pain. Recommended use of heating pad over effected areas as needed.   2. Moderate episode of recurrent major depressive disorder (HCC) Stable. Continue wellbutrin 100mg  twice daily. Refills provided today.  - buPROPion (WELLBUTRIN SR) 100 MG 12 hr tablet; Take 1 tablet (100 mg total) by mouth 2 (two) times daily.  Dispense: 60 tablet; Refill: 3  3. Attention and concentration deficit May continue adderall 20mg  twice daily as needed for focus/concentration. Three 30 day prescriptions provided. Dates are 09/17/2019, 10/16/2019, and 11/13/2019 - amphetamine-dextroamphetamine (ADDERALL) 20 MG tablet; Take 1 tablet (20 mg total) by mouth 2 (two) times daily as needed (for focus).  Dispense: 60 tablet; Refill: 0  General Counseling: Mickeal verbalizes understanding of the findings of todays visit and agrees with plan of treatment. I have discussed any further diagnostic evaluation that may be needed or ordered today. We also reviewed his medications today. he has  been encouraged to call the office with any questions or concerns that should arise related to todays visit.   This patient was seen by 11/15/2019 FNP Collaboration with Dr [name] as a part of collaborative care agreement  Meds ordered this encounter  Medications  . buPROPion (WELLBUTRIN SR) 100 MG 12 hr tablet    Sig: Take 1 tablet (100 mg total) by mouth 2 (two) times daily.    Dispense:  60 tablet    Refill:  3    Patient should have 60 tablets for twice daily dosing as needed. Thanks.    Order Specific Question:   Supervising Provider    Answer:   Vincent Gros [[id number]]  . DISCONTD: amphetamine-dextroamphetamine (ADDERALL) 20 MG tablet    Sig: Take 1 tablet (20 mg total) by mouth 2 (two) times daily as needed (for focus).    Dispense:  60 tablet    Refill:  0    Order Specific Question:   Supervising Provider    Answer:   Lyndon Code [[id number]]  . DISCONTD: amphetamine-dextroamphetamine (ADDERALL) 20 MG tablet    Sig: Take 1 tablet (20 mg total) by mouth 2 (two) times daily as needed (for focus).    Dispense:  60 tablet    Refill:  0    Fill after 10/16/2019    Order Specific Question:   Supervising Provider    Answer:   Lyndon Code [[id number]]  . amphetamine-dextroamphetamine (ADDERALL) 20 MG tablet    Sig: Take 1 tablet (20 mg total) by mouth 2 (two) times daily as needed (for focus).    Dispense:  60 tablet    Refill:  0    Fill after 11/13/2019    Order Specific Question:   Supervising Provider    Answer:   Lyndon Code [[id number]]    Time spent: 14 Minutes      Dr Lyndon Code Internal medicine

## 2019-12-08 ENCOUNTER — Telehealth: Payer: Self-pay

## 2019-12-08 NOTE — Telephone Encounter (Signed)
Confirmed virtual visit with patient. klh 

## 2019-12-10 ENCOUNTER — Ambulatory Visit: Payer: BLUE CROSS/BLUE SHIELD | Admitting: Adult Health

## 2019-12-10 ENCOUNTER — Other Ambulatory Visit: Payer: Self-pay

## 2019-12-10 ENCOUNTER — Encounter: Payer: Self-pay | Admitting: Adult Health

## 2019-12-10 VITALS — Ht 72.0 in | Wt 165.0 lb

## 2019-12-10 DIAGNOSIS — G5603 Carpal tunnel syndrome, bilateral upper limbs: Secondary | ICD-10-CM | POA: Diagnosis not present

## 2019-12-10 DIAGNOSIS — R41840 Attention and concentration deficit: Secondary | ICD-10-CM

## 2019-12-10 DIAGNOSIS — F331 Major depressive disorder, recurrent, moderate: Secondary | ICD-10-CM

## 2019-12-10 MED ORDER — AMPHETAMINE-DEXTROAMPHETAMINE 20 MG PO TABS: 20.0000 mg | ORAL_TABLET | Freq: Two times a day (BID) | ORAL | 0 refills | Status: DC

## 2019-12-10 MED ORDER — AMPHETAMINE-DEXTROAMPHETAMINE 20 MG PO TABS: 20.0000 mg | ORAL_TABLET | Freq: Two times a day (BID) | ORAL | 0 refills | Status: DC | PRN

## 2019-12-10 MED ORDER — BUPROPION HCL ER (SR) 100 MG PO TB12: 100.0000 mg | ORAL_TABLET | Freq: Two times a day (BID) | ORAL | 3 refills | Status: DC

## 2019-12-10 NOTE — Progress Notes (Signed)
[facility] [address] [place], [place] [address]  Internal MEDICINE  Telephone Visit  Patient Name: Kevin Whitehead  [id number]  [id number]  Date of Service: 12/10/2019  I connected with the patient at 908 by telephone and verified the patients identity using two identifiers.   I discussed the limitations, risks, security and privacy concerns of performing an evaluation and management service by telephone and the availability of in person appointments. I also discussed with the patient that there may be a patient responsible charge related to the service.  The patient expressed understanding and agrees to proceed.    Chief Complaint  Patient presents with  . Telephone Assessment  . Telephone Screen  . Follow-up    HPI  Pt is seen via telephone. He is here today to follow up on depression and add.  He is doing well.  Denies any overt issues with depression or add.  He denies any side effects from medications. His bp remains low, at his baseline.  He Denies Chest pain, Shortness of breath, palpitations, headache, or blurred vision.  He currently uses adderall to help keep his focus at work.  He does well with this.    Current Medication: Outpatient Encounter Medications as of 12/10/2019  Medication Sig  . amphetamine-dextroamphetamine (ADDERALL) 20 MG tablet Take 1 tablet (20 mg total) by mouth 2 (two) times daily as needed (for focus).  Marland Kitchen buPROPion (WELLBUTRIN SR) 100 MG 12 hr tablet Take 1 tablet (100 mg total) by mouth 2 (two) times daily.  Marland Kitchen docusate sodium (COLACE) 100 MG capsule Take 100 mg by mouth daily.  . meloxicam (MOBIC) 7.5 MG tablet Take 7.5 mg by mouth as needed for pain.  . Multiple Vitamin (MULTIVITAMIN) capsule Take 1 capsule by mouth daily.  Marland Kitchen PROCTO-MED HC 2.5 % rectal cream Place 1 application rectally as needed.   . [DISCONTINUED] amphetamine-dextroamphetamine (ADDERALL) 20 MG tablet Take 1 tablet (20 mg total) by mouth 2 (two) times daily as  needed (for focus).  . [DISCONTINUED] buPROPion (WELLBUTRIN SR) 100 MG 12 hr tablet Take 1 tablet (100 mg total) by mouth 2 (two) times daily.  Melene Muller ON 01/09/2020] amphetamine-dextroamphetamine (ADDERALL) 20 MG tablet Take 1 tablet (20 mg total) by mouth 2 (two) times daily.   No facility-administered encounter medications on file as of 12/10/2019.    Surgical History: Past Surgical History:  Procedure Laterality Date  . ANKLE SURGERY  2016    Medical History: Past Medical History:  Diagnosis Date  . Hemorrhoid     Family History: Family History  Problem Relation Age of Onset  . COPD Father     Social History   Socioeconomic History  . Marital status: Married    Spouse name: Not on file  . Number of children: Not on file  . Years of education: Not on file  . Highest education level: Not on file  Occupational History  . Not on file  Tobacco Use  . Smoking status: Former Smoker    Types: Cigarettes    Quit date: 2017    Years since quitting: 4.0  . Smokeless tobacco: Never Used  Substance and Sexual Activity  . Alcohol use: No  . Drug use: No  . Sexual activity: Not on file  Other Topics Concern  . Not on file  Social History Narrative  . Not on file   Social Determinants of Health   Financial Resource Strain:   . Difficulty of Paying Living Expenses: Not  on file  Food Insecurity:   . Worried About Programme researcher, broadcasting/film/video in the Last Year: Not on file  . Ran Out of Food in the Last Year: Not on file  Transportation Needs:   . Lack of Transportation (Medical): Not on file  . Lack of Transportation (Non-Medical): Not on file  Physical Activity:   . Days of Exercise per Week: Not on file  . Minutes of Exercise per Session: Not on file  Stress:   . Feeling of Stress : Not on file  Social Connections:   . Frequency of Communication with Friends and Family: Not on file  . Frequency of Social Gatherings with Friends and Family: Not on file  . Attends Religious  Services: Not on file  . Active Member of Clubs or Organizations: Not on file  . Attends Banker Meetings: Not on file  . Marital Status: Not on file  Intimate Partner Violence:   . Fear of Current or Ex-Partner: Not on file  . Emotionally Abused: Not on file  . Physically Abused: Not on file  . Sexually Abused: Not on file      Review of Systems  Constitutional: Negative.  Negative for chills, fatigue and unexpected weight change.  HENT: Negative.  Negative for congestion, rhinorrhea, sneezing and sore throat.   Eyes: Negative for redness.  Respiratory: Negative.  Negative for cough, chest tightness and shortness of breath.   Cardiovascular: Negative.  Negative for chest pain and palpitations.  Gastrointestinal: Negative.  Negative for abdominal pain, constipation, diarrhea, nausea and vomiting.  Endocrine: Negative.   Genitourinary: Negative.  Negative for dysuria and frequency.  Musculoskeletal: Negative.  Negative for arthralgias, back pain, joint swelling and neck pain.  Skin: Negative.  Negative for rash.  Allergic/Immunologic: Negative.   Neurological: Negative.  Negative for tremors and numbness.  Hematological: Negative for adenopathy. Does not bruise/bleed easily.  Psychiatric/Behavioral: Negative.  Negative for behavioral problems, sleep disturbance and suicidal ideas. The patient is not nervous/anxious.     Vital Signs: Ht 6' (1.829 m)   Wt 165 lb (74.8 kg)   BMI 22.38 kg/m    Observation/Objective:  Well sounding, speaking in full sentences   Assessment/Plan: 1. Attention and concentration deficit Refilled Controlled medications today. Reviewed risks and possible side effects associated with taking Stimulants. Combination of these drugs with other psychotropic medications could cause dizziness and drowsiness. Pt needs to Monitor symptoms and exercise caution in driving and operating heavy machinery to avoid damages to oneself, to others and to the  surroundings. Patient verbalized understanding in this matter. Dependence and abuse for these drugs will be monitored closely. A Controlled substance policy and procedure is on file which allows [facility] to order a urine drug screen test at any visit. Patient understands and agrees with the plan.. - amphetamine-dextroamphetamine (ADDERALL) 20 MG tablet; Take 1 tablet (20 mg total) by mouth 2 (two) times daily as needed (for focus).  Dispense: 60 tablet; Refill: 0 - amphetamine-dextroamphetamine (ADDERALL) 20 MG tablet; Take 1 tablet (20 mg total) by mouth 2 (two) times daily.  Dispense: 60 tablet; Refill: 0  2. Moderate episode of recurrent major depressive disorder (HCC) Depression well controlled, continue wellbutrin as before. - buPROPion (WELLBUTRIN SR) 100 MG 12 hr tablet; Take 1 tablet (100 mg total) by mouth 2 (two) times daily.  Dispense: 60 tablet; Refill: 3  3. Bilateral carpal tunnel syndrome Stable, using NSAIDS as needed  General Counseling: Benna Dunks understanding of the findings  of today's phone visit and agrees with plan of treatment. I have discussed any further diagnostic evaluation that may be needed or ordered today. We also reviewed his medications today. he has been encouraged to call the office with any questions or concerns that should arise related to todays visit.    No orders of the defined types were placed in this encounter.   Meds ordered this encounter  Medications  . amphetamine-dextroamphetamine (ADDERALL) 20 MG tablet    Sig: Take 1 tablet (20 mg total) by mouth 2 (two) times daily as needed (for focus).    Dispense:  60 tablet    Refill:  0  . amphetamine-dextroamphetamine (ADDERALL) 20 MG tablet    Sig: Take 1 tablet (20 mg total) by mouth 2 (two) times daily.    Dispense:  60 tablet    Refill:  0    Do not fill before 01/09/20  . buPROPion (WELLBUTRIN SR) 100 MG 12 hr tablet    Sig: Take 1 tablet (100 mg total) by mouth 2 (two)  times daily.    Dispense:  60 tablet    Refill:  3    Patient should have 60 tablets for twice daily dosing as needed. Thanks.    Time spent: [place] AGNP-C Internal medicine

## 2020-02-02 ENCOUNTER — Telehealth: Payer: Self-pay

## 2020-02-02 NOTE — Telephone Encounter (Signed)
CONFIRMED AND SCREENED FOR 02-04-20 OV.  

## 2020-02-04 ENCOUNTER — Ambulatory Visit: Payer: BLUE CROSS/BLUE SHIELD | Admitting: Nurse Practitioner

## 2020-02-04 ENCOUNTER — Encounter: Payer: Self-pay | Admitting: Nurse Practitioner

## 2020-02-04 ENCOUNTER — Other Ambulatory Visit: Payer: Self-pay

## 2020-02-04 ENCOUNTER — Ambulatory Visit: Payer: BLUE CROSS/BLUE SHIELD | Admitting: Adult Health

## 2020-02-04 VITALS — BP 119/80 | HR 82 | Temp 97.5°F | Resp 16 | Ht 72.0 in | Wt 174.0 lb

## 2020-02-04 DIAGNOSIS — F331 Major depressive disorder, recurrent, moderate: Secondary | ICD-10-CM

## 2020-02-04 DIAGNOSIS — M064 Inflammatory polyarthropathy: Secondary | ICD-10-CM

## 2020-02-04 DIAGNOSIS — R41840 Attention and concentration deficit: Secondary | ICD-10-CM

## 2020-02-04 DIAGNOSIS — Z79899 Other long term (current) drug therapy: Secondary | ICD-10-CM

## 2020-02-04 LAB — POCT URINE DRUG SCREEN
POC Amphetamine UR: POSITIVE — AB
POC BENZODIAZEPINES UR: NOT DETECTED
POC Barbiturate UR: NOT DETECTED
POC Cocaine UR: NOT DETECTED
POC Ecstasy UR: NOT DETECTED
POC Marijuana UR: NOT DETECTED
POC Methadone UR: NOT DETECTED
POC Methamphetamine UR: NOT DETECTED
POC Opiate Ur: NOT DETECTED
POC Oxycodone UR: POSITIVE — AB
POC PHENCYCLIDINE UR: NOT DETECTED
POC TRICYCLICS UR: NOT DETECTED

## 2020-02-04 MED ORDER — AMPHETAMINE-DEXTROAMPHETAMINE 20 MG PO TABS: 20.0000 mg | ORAL_TABLET | Freq: Two times a day (BID) | ORAL | 0 refills | Status: DC | PRN

## 2020-02-04 MED ORDER — BUPROPION HCL ER (SR) 100 MG PO TB12: 100.0000 mg | ORAL_TABLET | Freq: Two times a day (BID) | ORAL | 1 refills | Status: DC

## 2020-02-04 NOTE — Progress Notes (Signed)
[facility] [address] [place], [place] [address]  Internal MEDICINE  Office Visit Note  Patient Name: Kevin Whitehead  [id number]  [id number]  Date of Service: 02/04/2020  Chief Complaint  Patient presents with  . Follow-up    med refills     The patient is here for routine follow up visit. The patient takes adderall 20mg  twice daily when needed to help with focus and concentration. Keeps him on track and productive while at work. No negative side effects to report. Needs to have refills for this today.  He is also taking wellbutrin which was started several months ago after his son was critically injured in car accident. The patient states that he is doing well on this medication. Son is doing better, at home and continuing to heal well.          Current Medication: Outpatient Encounter Medications as of 02/04/2020  Medication Sig  . amphetamine-dextroamphetamine (ADDERALL) 20 MG tablet Take 1 tablet (20 mg total) by mouth 2 (two) times daily as needed (for focus).  02/06/2020 buPROPion (WELLBUTRIN SR) 100 MG 12 hr tablet Take 1 tablet (100 mg total) by mouth 2 (two) times daily.  Marland Kitchen docusate sodium (COLACE) 100 MG capsule Take 100 mg by mouth daily.  . meloxicam (MOBIC) 7.5 MG tablet Take 7.5 mg by mouth as needed for pain.  . Multiple Vitamin (MULTIVITAMIN) capsule Take 1 capsule by mouth daily.  Marland Kitchen PROCTO-MED HC 2.5 % rectal cream Place 1 application rectally as needed.   . [DISCONTINUED] amphetamine-dextroamphetamine (ADDERALL) 20 MG tablet Take 1 tablet (20 mg total) by mouth 2 (two) times daily as needed (for focus).  . [DISCONTINUED] amphetamine-dextroamphetamine (ADDERALL) 20 MG tablet Take 1 tablet (20 mg total) by mouth 2 (two) times daily.  . [DISCONTINUED] amphetamine-dextroamphetamine (ADDERALL) 20 MG tablet Take 1 tablet (20 mg total) by mouth 2 (two) times daily as needed (for focus).  . [DISCONTINUED] amphetamine-dextroamphetamine (ADDERALL) 20 MG tablet  Take 1 tablet (20 mg total) by mouth 2 (two) times daily as needed (for focus).  . [DISCONTINUED] buPROPion (WELLBUTRIN SR) 100 MG 12 hr tablet Take 1 tablet (100 mg total) by mouth 2 (two) times daily.   No facility-administered encounter medications on file as of 02/04/2020.    Surgical History: Past Surgical History:  Procedure Laterality Date  . ANKLE SURGERY  2016    Medical History: Past Medical History:  Diagnosis Date  . Attention and concentration deficit   . Hemorrhoid     Family History: Family History  Problem Relation Age of Onset  . COPD Father     Social History   Socioeconomic History  . Marital status: Married    Spouse name: Not on file  . Number of children: Not on file  . Years of education: Not on file  . Highest education level: Not on file  Occupational History  . Not on file  Tobacco Use  . Smoking status: Former Smoker    Types: Cigarettes    Quit date: 2017    Years since quitting: 4.2  . Smokeless tobacco: Never Used  Substance and Sexual Activity  . Alcohol use: No  . Drug use: No  . Sexual activity: Not on file  Other Topics Concern  . Not on file  Social History Narrative  . Not on file   Social Determinants of Health   Financial Resource Strain:   . Difficulty of Paying Living Expenses:   Food Insecurity:   .  Worried About Charity fundraiser in the Last Year:   . Arboriculturist in the Last Year:   Transportation Needs:   . Film/video editor (Medical):   Marland Kitchen Lack of Transportation (Non-Medical):   Physical Activity:   . Days of Exercise per Week:   . Minutes of Exercise per Session:   Stress:   . Feeling of Stress :   Social Connections:   . Frequency of Communication with Friends and Family:   . Frequency of Social Gatherings with Friends and Family:   . Attends Religious Services:   . Active Member of Clubs or Organizations:   . Attends Archivist Meetings:   Marland Kitchen Marital Status:   Intimate Partner  Violence:   . Fear of Current or Ex-Partner:   . Emotionally Abused:   Marland Kitchen Physically Abused:   . Sexually Abused:       Review of Systems  Constitutional: Negative for activity change, chills, fatigue and unexpected weight change.  HENT: Negative for congestion, postnasal drip, rhinorrhea, sinus pressure, sneezing and sore throat.   Respiratory: Negative for cough, chest tightness, shortness of breath and wheezing.   Cardiovascular: Negative for chest pain and palpitations.  Gastrointestinal: Negative for abdominal pain, constipation, diarrhea, nausea and vomiting.  Endocrine: Negative for cold intolerance, heat intolerance, polydipsia and polyuria.  Musculoskeletal: Positive for arthralgias and myalgias. Negative for back pain, joint swelling and neck pain.       Does take meloxicam when needed for pain/inflammaiton.   Skin: Negative for rash.  Allergic/Immunologic: Negative for environmental allergies.  Neurological: Negative for dizziness, tremors, weakness, numbness and headaches.  Hematological: Negative for adenopathy. Does not bruise/bleed easily.  Psychiatric/Behavioral: Positive for decreased concentration and dysphoric mood. Negative for behavioral problems (Depression), sleep disturbance and suicidal ideas. The patient is not nervous/anxious.        Doing well with current doses of wellbutrin and adderall.     Today's Vitals   02/04/20 0913  BP: 119/80  Pulse: 82  Resp: 16  Temp: (!) 97.5 F (36.4 C)  SpO2: 98%  Weight: 174 lb (78.9 kg)  Height: 6' (1.829 m)   Body mass index is 23.6 kg/m.  Physical Exam Vitals and nursing note reviewed.  Constitutional:      General: He is not in acute distress.    Appearance: Normal appearance. He is well-developed. He is not diaphoretic.  HENT:     Head: Normocephalic and atraumatic.     Mouth/Throat:     Pharynx: No oropharyngeal exudate.  Eyes:     Pupils: Pupils are equal, round, and reactive to light.  Neck:      Thyroid: No thyromegaly.     Vascular: No JVD.     Trachea: No tracheal deviation.  Cardiovascular:     Rate and Rhythm: Normal rate and regular rhythm.     Heart sounds: Normal heart sounds. No murmur. No friction rub. No gallop.   Pulmonary:     Effort: Pulmonary effort is normal. No respiratory distress.     Breath sounds: Normal breath sounds. No wheezing or rales.  Chest:     Chest wall: No tenderness.  Abdominal:     Palpations: Abdomen is soft.  Musculoskeletal:        General: Normal range of motion.     Cervical back: Normal range of motion and neck supple.  Lymphadenopathy:     Cervical: No cervical adenopathy.  Skin:    General: Skin is warm  and dry.  Neurological:     Mental Status: He is alert and oriented to person, place, and time.     Cranial Nerves: No cranial nerve deficit.  Psychiatric:        Mood and Affect: Mood normal.        Behavior: Behavior normal.        Thought Content: Thought content normal.        Judgment: Judgment normal.    Assessment/Plan: 1. Inflammatory polyarthropathy (HCC) May take meloxicam as needed and as prescribed  2. Moderate episode of recurrent major depressive disorder (HCC) Well managed on current dose of wellbutrin. Refills provided today.  - buPROPion (WELLBUTRIN SR) 100 MG 12 hr tablet; Take 1 tablet (100 mg total) by mouth 2 (two) times daily.  Dispense: 180 tablet; Refill: 1  3. Attention and concentration deficit May conitnue adderall 20mg  twice daily as needed. Three 30 day prescriptions provided to his pharmacy. Dates are 02/04/2020, 03/04/2020, and 04/03/2020 - amphetamine-dextroamphetamine (ADDERALL) 20 MG tablet; Take 1 tablet (20 mg total) by mouth 2 (two) times daily as needed (for focus).  Dispense: 60 tablet; Refill: 0  4. Encounter for long-term (current) use of high-risk medication UDS positive for AMP and oxycodone. - POCT Urine Drug Screen  General Counseling: [place] verbalizes understanding of the  findings of todays visit and agrees with plan of treatment. I have discussed any further diagnostic evaluation that may be needed or ordered today. We also reviewed his medications today. he has been encouraged to call the office with any questions or concerns that should arise related to todays visit.  Refilled Controlled medications today. Reviewed risks and possible side effects associated with taking Stimulants. Combination of these drugs with other psychotropic medications could cause dizziness and drowsiness. Pt needs to Monitor symptoms and exercise caution in driving and operating heavy machinery to avoid damages to oneself, to others and to the surroundings. Patient verbalized understanding in this matter. Dependence and abuse for these drugs will be monitored closely. A Controlled substance policy and procedure is on file which allows [facility] to order a urine drug screen test at any visit. Patient understands and agrees with the plan..  This patient was seen by [place] FNP Collaboration with Dr Vincent Gros as a part of collaborative care agreement  Orders Placed This Encounter  Procedures  . POCT Urine Drug Screen    Meds ordered this encounter  Medications  . DISCONTD: amphetamine-dextroamphetamine (ADDERALL) 20 MG tablet    Sig: Take 1 tablet (20 mg total) by mouth 2 (two) times daily as needed (for focus).    Dispense:  60 tablet    Refill:  0    Order Specific Question:   Supervising Provider    Answer:   Lyndon Code [[id number]]  . buPROPion (WELLBUTRIN SR) 100 MG 12 hr tablet    Sig: Take 1 tablet (100 mg total) by mouth 2 (two) times daily.    Dispense:  180 tablet    Refill:  1    Please fill as 90 day prescription    Order Specific Question:   Supervising Provider    Answer:   Lyndon Code [[id number]]  . DISCONTD: amphetamine-dextroamphetamine (ADDERALL) 20 MG tablet    Sig: Take 1 tablet (20 mg total) by mouth 2 (two) times daily as needed (for focus).     Dispense:  60 tablet    Refill:  0    Fill after 03/04/2020  Order Specific Question:   Supervising Provider    Answer:   Lyndon Code [[id number]]  . amphetamine-dextroamphetamine (ADDERALL) 20 MG tablet    Sig: Take 1 tablet (20 mg total) by mouth 2 (two) times daily as needed (for focus).    Dispense:  60 tablet    Refill:  0    Fill after  04/01/2020    Order Specific Question:   Supervising Provider    Answer:   Lyndon Code [[id number]]    Total time spent: 30 Minutes  Time spent includes review of chart, medications, test results, and follow up plan with the patient.      Dr Lyndon Code Internal medicine

## 2020-05-04 ENCOUNTER — Ambulatory Visit: Payer: BLUE CROSS/BLUE SHIELD | Admitting: Nurse Practitioner

## 2020-05-04 ENCOUNTER — Encounter: Payer: Self-pay | Admitting: Nurse Practitioner

## 2020-05-04 ENCOUNTER — Other Ambulatory Visit: Payer: Self-pay

## 2020-05-04 VITALS — BP 100/70 | HR 89 | Temp 97.7°F | Resp 16 | Ht 72.0 in | Wt 173.6 lb

## 2020-05-04 DIAGNOSIS — R41840 Attention and concentration deficit: Secondary | ICD-10-CM

## 2020-05-04 DIAGNOSIS — G5603 Carpal tunnel syndrome, bilateral upper limbs: Secondary | ICD-10-CM | POA: Diagnosis not present

## 2020-05-04 DIAGNOSIS — F331 Major depressive disorder, recurrent, moderate: Secondary | ICD-10-CM

## 2020-05-04 MED ORDER — AMPHETAMINE-DEXTROAMPHETAMINE 20 MG PO TABS: 20.0000 mg | ORAL_TABLET | Freq: Two times a day (BID) | ORAL | 0 refills | Status: DC | PRN

## 2020-05-04 NOTE — Progress Notes (Signed)
[facility] [address] [place], [place] [address]  Internal MEDICINE  Office Visit Note  Patient Name: Kevin Whitehead  [id number]  [id number]  Date of Service: 05/14/2020  Chief Complaint  Patient presents with  . Follow-up    The patient is here for routine follow up visit. The patient takes adderall 20mg  twice daily when needed to help with focus and concentration. Keeps him on track and productive while at work. No negative side effects to report. Needs to have refills for this today. He states that he is also continuing to do well on his current dose of Wellbutrin.       Current Medication: Outpatient Encounter Medications as of 05/04/2020  Medication Sig  . amphetamine-dextroamphetamine (ADDERALL) 20 MG tablet Take 1 tablet (20 mg total) by mouth 2 (two) times daily as needed (for focus).  05/06/2020 buPROPion (WELLBUTRIN SR) 100 MG 12 hr tablet Take 1 tablet (100 mg total) by mouth 2 (two) times daily.  Marland Kitchen docusate sodium (COLACE) 100 MG capsule Take 100 mg by mouth daily.  . meloxicam (MOBIC) 7.5 MG tablet Take 7.5 mg by mouth as needed for pain.  . Multiple Vitamin (MULTIVITAMIN) capsule Take 1 capsule by mouth daily.  Marland Kitchen PROCTO-MED HC 2.5 % rectal cream Place 1 application rectally as needed.   . [DISCONTINUED] amphetamine-dextroamphetamine (ADDERALL) 20 MG tablet Take 1 tablet (20 mg total) by mouth 2 (two) times daily as needed (for focus).  . [DISCONTINUED] amphetamine-dextroamphetamine (ADDERALL) 20 MG tablet Take 1 tablet (20 mg total) by mouth 2 (two) times daily as needed (for focus).  . [DISCONTINUED] amphetamine-dextroamphetamine (ADDERALL) 20 MG tablet Take 1 tablet (20 mg total) by mouth 2 (two) times daily as needed (for focus).   No facility-administered encounter medications on file as of 05/04/2020.    Surgical History: Past Surgical History:  Procedure Laterality Date  . ANKLE SURGERY  2016    Medical History: Past Medical History:  Diagnosis  Date  . Attention and concentration deficit   . Hemorrhoid     Family History: Family History  Problem Relation Age of Onset  . COPD Father     Social History   Socioeconomic History  . Marital status: Married    Spouse name: Not on file  . Number of children: Not on file  . Years of education: Not on file  . Highest education level: Not on file  Occupational History  . Not on file  Tobacco Use  . Smoking status: Former Smoker    Types: Cigarettes    Quit date: 2017    Years since quitting: 4.4  . Smokeless tobacco: Never Used  Substance and Sexual Activity  . Alcohol use: No  . Drug use: No  . Sexual activity: Not on file  Other Topics Concern  . Not on file  Social History Narrative  . Not on file   Social Determinants of Health   Financial Resource Strain:   . Difficulty of Paying Living Expenses:   Food Insecurity:   . Worried About 2018 in the Last Year:   . Programme researcher, broadcasting/film/video in the Last Year:   Transportation Needs:   . Barista (Medical):   Freight forwarder Lack of Transportation (Non-Medical):   Physical Activity:   . Days of Exercise per Week:   . Minutes of Exercise per Session:   Stress:   . Feeling of Stress :   Social Connections:   . Frequency of  Communication with Friends and Family:   . Frequency of Social Gatherings with Friends and Family:   . Attends Religious Services:   . Active Member of Clubs or Organizations:   . Attends Archivist Meetings:   Marland Kitchen Marital Status:   Intimate Partner Violence:   . Fear of Current or Ex-Partner:   . Emotionally Abused:   Marland Kitchen Physically Abused:   . Sexually Abused:       Review of Systems  Constitutional: Negative for activity change, chills, fatigue and unexpected weight change.  HENT: Negative for congestion, postnasal drip, rhinorrhea, sinus pressure, sneezing and sore throat.   Respiratory: Negative for cough, chest tightness, shortness of breath and wheezing.    Cardiovascular: Negative for chest pain and palpitations.  Gastrointestinal: Negative for abdominal pain, constipation, diarrhea, nausea and vomiting.  Endocrine: Negative for cold intolerance, heat intolerance, polydipsia and polyuria.  Musculoskeletal: Positive for arthralgias and myalgias. Negative for back pain, joint swelling and neck pain.       Does take meloxicam when needed for pain/inflammaiton.   Skin: Negative for rash.  Allergic/Immunologic: Negative for environmental allergies.  Neurological: Negative for dizziness, tremors, weakness, numbness and headaches.  Hematological: Negative for adenopathy. Does not bruise/bleed easily.  Psychiatric/Behavioral: Positive for decreased concentration and dysphoric mood. Negative for behavioral problems (Depression), sleep disturbance and suicidal ideas. The patient is not nervous/anxious.        Doing well with current doses of wellbutrin and adderall.     Today's Vitals   05/04/20 0846  BP: 100/70  Pulse: 89  Resp: 16  Temp: 97.7 F (36.5 C)  SpO2: 97%  Weight: 173 lb 9.6 oz (78.7 kg)  Height: 6' (1.829 m)   Body mass index is 23.54 kg/m.  Physical Exam Vitals and nursing note reviewed.  Constitutional:      General: He is not in acute distress.    Appearance: Normal appearance. He is well-developed. He is not diaphoretic.  HENT:     Head: Normocephalic and atraumatic.     Mouth/Throat:     Pharynx: No oropharyngeal exudate.  Eyes:     Pupils: Pupils are equal, round, and reactive to light.  Neck:     Thyroid: No thyromegaly.     Vascular: No JVD.     Trachea: No tracheal deviation.  Cardiovascular:     Rate and Rhythm: Normal rate and regular rhythm.     Heart sounds: Normal heart sounds. No murmur heard.  No friction rub. No gallop.   Pulmonary:     Effort: Pulmonary effort is normal. No respiratory distress.     Breath sounds: Normal breath sounds. No wheezing or rales.  Chest:     Chest wall: No tenderness.   Abdominal:     Palpations: Abdomen is soft.  Musculoskeletal:        General: Normal range of motion.     Cervical back: Normal range of motion and neck supple.  Lymphadenopathy:     Cervical: No cervical adenopathy.  Skin:    General: Skin is warm and dry.  Neurological:     Mental Status: He is alert and oriented to person, place, and time.     Cranial Nerves: No cranial nerve deficit.  Psychiatric:        Mood and Affect: Mood normal.        Behavior: Behavior normal.        Thought Content: Thought content normal.        Judgment: Judgment  normal.    Assessment/Plan: 1. Bilateral carpal tunnel syndrome May continue to take meloxicam as needed and as prescribed to reduce pain and inflammation.   2. Attention and concentration deficit May continue totake adderall 20mg  up to twice daily as needed for focus and concentration. Three 30 day prescriptions were sent to his pharmacy. Dates are 05/04/2020, 06/01/2020, and 06/30/2020 - amphetamine-dextroamphetamine (ADDERALL) 20 MG tablet; Take 1 tablet (20 mg total) by mouth 2 (two) times daily as needed (for focus).  Dispense: 60 tablet; Refill: 0  3. Moderate episode of recurrent major depressive disorder (HCC) Doing well on current dose wellbutrin. Continue as prescribed   General Counseling: tion tse understanding of the findings of todays visit and agrees with plan of treatment. I have discussed any further diagnostic evaluation that may be needed or ordered today. We also reviewed his medications today. he has been encouraged to call the office with any questions or concerns that should arise related to todays visit.  This patient was seen by Benna Dunks FNP Collaboration with Dr Vincent Gros as a part of collaborative care agreement  Meds ordered this encounter  Medications  . DISCONTD: amphetamine-dextroamphetamine (ADDERALL) 20 MG tablet    Sig: Take 1 tablet (20 mg total) by mouth 2 (two) times daily as needed (for  focus).    Dispense:  60 tablet    Refill:  0    Order Specific Question:   Supervising Provider    Answer:   Lyndon Code [[id number]]  . DISCONTD: amphetamine-dextroamphetamine (ADDERALL) 20 MG tablet    Sig: Take 1 tablet (20 mg total) by mouth 2 (two) times daily as needed (for focus).    Dispense:  60 tablet    Refill:  0    Fill after 06/01/2020    Order Specific Question:   Supervising Provider    Answer:   06/03/2020 [[id number]]  . amphetamine-dextroamphetamine (ADDERALL) 20 MG tablet    Sig: Take 1 tablet (20 mg total) by mouth 2 (two) times daily as needed (for focus).    Dispense:  60 tablet    Refill:  0    Fill after 06/30/2020    Order Specific Question:   Supervising Provider    Answer:   07/02/2020 [[id number]]    Total time spent: 25 Minutes   Time spent includes review of chart, medications, test results, and follow up plan with the patient.      Dr Lyndon Code Internal medicine

## 2020-07-28 ENCOUNTER — Telehealth: Payer: Self-pay

## 2020-07-28 NOTE — Telephone Encounter (Signed)
CONFIRMED PT APPT 08/01/20. BR °

## 2020-08-01 ENCOUNTER — Ambulatory Visit: Payer: BLUE CROSS/BLUE SHIELD | Admitting: Nurse Practitioner

## 2020-08-03 ENCOUNTER — Encounter: Payer: Self-pay | Admitting: Hospice and Palliative Medicine

## 2020-08-03 ENCOUNTER — Ambulatory Visit: Payer: BLUE CROSS/BLUE SHIELD | Admitting: Hospice and Palliative Medicine

## 2020-08-03 ENCOUNTER — Other Ambulatory Visit: Payer: Self-pay

## 2020-08-03 DIAGNOSIS — Z79899 Other long term (current) drug therapy: Secondary | ICD-10-CM | POA: Diagnosis not present

## 2020-08-03 DIAGNOSIS — F331 Major depressive disorder, recurrent, moderate: Secondary | ICD-10-CM

## 2020-08-03 DIAGNOSIS — Z0001 Encounter for general adult medical examination with abnormal findings: Secondary | ICD-10-CM

## 2020-08-03 DIAGNOSIS — R41840 Attention and concentration deficit: Secondary | ICD-10-CM | POA: Diagnosis not present

## 2020-08-03 LAB — POCT URINE DRUG SCREEN
Methylenedioxyamphetamine: NOT DETECTED
POC Amphetamine UR: POSITIVE — AB
POC BENZODIAZEPINES UR: NOT DETECTED
POC Barbiturate UR: NOT DETECTED
POC Cocaine UR: NOT DETECTED
POC Ecstasy UR: NOT DETECTED
POC Marijuana UR: NOT DETECTED
POC Methadone UR: NOT DETECTED
POC Methamphetamine UR: NOT DETECTED
POC Opiate Ur: POSITIVE — AB
POC Oxycodone UR: NOT DETECTED
POC PHENCYCLIDINE UR: NOT DETECTED
POC TRICYCLICS UR: NOT DETECTED

## 2020-08-03 MED ORDER — BUPROPION HCL ER (SR) 100 MG PO TB12: 100.0000 mg | ORAL_TABLET | Freq: Two times a day (BID) | ORAL | 1 refills | Status: DC

## 2020-08-03 NOTE — Progress Notes (Signed)
[facility] [address] [place], [place] [address]  Internal MEDICINE  Office Visit Note  Patient Name: Kevin Whitehead  [id number]  [id number]  Date of Service: 08/05/2020  Chief Complaint  Patient presents with   Follow-up    refill request    HPI Patient is here for routine follow-up He is requesting refills of Adderall at this time, he takes his Adderall to help with focus while he is working, he routinely does not take his adderall on the weekends as he is not working Discussed the results of his urine drug screen, he says he tested positive for opiates due to taking some of his wife cough medicine the last few nights that he thinks had codeine in it He is also requesting refills for his Wellbutrin today, he says his depression has been well controlled   Current Medication: Outpatient Encounter Medications as of 08/03/2020  Medication Sig   amphetamine-dextroamphetamine (ADDERALL) 20 MG tablet Take 1 tablet (20 mg total) by mouth 2 (two) times daily as needed (for focus).   buPROPion (WELLBUTRIN SR) 100 MG 12 hr tablet Take 1 tablet (100 mg total) by mouth 2 (two) times daily.   docusate sodium (COLACE) 100 MG capsule Take 100 mg by mouth daily.   meloxicam (MOBIC) 7.5 MG tablet Take 7.5 mg by mouth as needed for pain.   Multiple Vitamin (MULTIVITAMIN) capsule Take 1 capsule by mouth daily.   PROCTO-MED HC 2.5 % rectal cream Place 1 application rectally as needed.    [DISCONTINUED] buPROPion (WELLBUTRIN SR) 100 MG 12 hr tablet Take 1 tablet (100 mg total) by mouth 2 (two) times daily.   No facility-administered encounter medications on file as of 08/03/2020.    Surgical History: Past Surgical History:  Procedure Laterality Date   ANKLE SURGERY  2016    Medical History: Past Medical History:  Diagnosis Date   Attention and concentration deficit    Hemorrhoid     Family History: Family History  Problem Relation Age of Onset   COPD  Father     Social History   Socioeconomic History   Marital status: Married    Spouse name: Not on file   Number of children: Not on file   Years of education: Not on file   Highest education level: Not on file  Occupational History   Not on file  Tobacco Use   Smoking status: Former Smoker    Types: Cigarettes    Quit date: 2017    Years since quitting: 4.7   Smokeless tobacco: Never Used  Substance and Sexual Activity   Alcohol use: No   Drug use: No   Sexual activity: Not on file  Other Topics Concern   Not on file  Social History Narrative   Not on file   Social Determinants of Health   Financial Resource Strain:    Difficulty of Paying Living Expenses: Not on file  Food Insecurity:    Worried About Programme researcher, broadcasting/film/video in the Last Year: Not on file   [employer] of Food in the Last Year: Not on file  Transportation Needs:    Lack of Transportation (Medical): Not on file   Lack of Transportation (Non-Medical): Not on file  Physical Activity:    Days of Exercise per Week: Not on file   Minutes of Exercise per Session: Not on file  Stress:    Feeling of Stress : Not on file  Social Connections:    Frequency  of Communication with Friends and Family: Not on file   Frequency of Social Gatherings with Friends and Family: Not on file   Attends Religious Services: Not on file   Active Member of Clubs or Organizations: Not on file   Attends Banker Meetings: Not on file   Marital Status: Not on file  Intimate Partner Violence:    Fear of Current or Ex-Partner: Not on file   Emotionally Abused: Not on file   Physically Abused: Not on file   Sexually Abused: Not on file    Review of Systems  Constitutional: Negative for chills, fatigue and unexpected weight change.  HENT: Negative for congestion, postnasal drip, rhinorrhea, sneezing and sore throat.   Eyes: Negative for photophobia, redness and visual disturbance.   Respiratory: Negative for cough, chest tightness and shortness of breath.   Cardiovascular: Negative for chest pain, palpitations and leg swelling.  Gastrointestinal: Negative for abdominal pain, constipation, diarrhea, nausea and vomiting.  Genitourinary: Negative for dysuria and frequency.  Musculoskeletal: Negative for arthralgias, back pain, joint swelling and neck pain.  Skin: Negative for color change and rash.  Neurological: Negative for dizziness, tremors, numbness and headaches.  Hematological: Negative for adenopathy. Does not bruise/bleed easily.  Psychiatric/Behavioral: Negative for behavioral problems (Depression), sleep disturbance and suicidal ideas. The patient is not nervous/anxious.     Vital Signs: BP 102/68    Pulse 89    Temp (!) 97.2 F (36.2 C)    Resp 16    Ht 6' (1.829 m)    Wt 176 lb (79.8 kg)    SpO2 97%    BMI 23.87 kg/m    Physical Exam Vitals reviewed.  Constitutional:      Appearance: Normal appearance. He is normal weight.  HENT:     Nose: Nose normal.     Mouth/Throat:     Mouth: Mucous membranes are moist.     Pharynx: Oropharynx is clear.  Cardiovascular:     Rate and Rhythm: Normal rate and regular rhythm.     Pulses: Normal pulses.     Heart sounds: Normal heart sounds.  Pulmonary:     Effort: Pulmonary effort is normal.     Breath sounds: Normal breath sounds.  Abdominal:     General: Abdomen is flat.     Palpations: Abdomen is soft.  Musculoskeletal:        General: Normal range of motion.     Cervical back: Normal range of motion.  Skin:    General: Skin is warm.  Neurological:     General: No focal deficit present.     Mental Status: He is alert and oriented to person, place, and time. Mental status is at baseline.  Psychiatric:        Mood and Affect: Mood normal.        Behavior: Behavior normal.        Thought Content: Thought content normal.    Assessment/Plan: 1. Attention and concentration deficit At this time, due  to his urine drug screen refills for adderall not given today. He will need to come back to the office for repeat drug screen.  2. Moderate episode of recurrent major depressive disorder (HCC) Depression stable at this time, requesting refills today, will continue with routine monitoring. - buPROPion (WELLBUTRIN SR) 100 MG 12 hr tablet; Take 1 tablet (100 mg total) by mouth 2 (two) times daily.  Dispense: 180 tablet; Refill: 1  3. Encounter for long-term (current) use of high-risk medication -  POCT Urine Drug Screen  Labs ordered for upcoming CPE - CBC w/Diff/Platelet - Comprehensive Metabolic Panel (CMET) - Lipid Panel With LDL/HDL Ratio - TSH + free T4  General Counseling: Jemel verbalizes understanding of the findings of todays visit and agrees with plan of treatment. I have discussed any further diagnostic evaluation that may be needed or ordered today. We also reviewed his medications today. he has been encouraged to call the office with any questions or concerns that should arise related to todays visit.    Orders Placed This Encounter  Procedures   CBC w/Diff/Platelet   Comprehensive Metabolic Panel (CMET)   Lipid Panel With LDL/HDL Ratio   TSH + free T4   POCT Urine Drug Screen    Meds ordered this encounter  Medications   buPROPion (WELLBUTRIN SR) 100 MG 12 hr tablet    Sig: Take 1 tablet (100 mg total) by mouth 2 (two) times daily.    Dispense:  180 tablet    Refill:  1    Please fill as 90 day prescription    Time spent: 30 Minutes Time spent includes review of chart, medications, test results and follow-up plan with the patient.  This patient was seen by Leeanne Deed AGNP-C in Collaboration with Dr Lyndon Code as a part of collaborative care agreement     Lubertha Basque. Mayan Kloepfer AGNP-C Internal medicine

## 2020-08-05 ENCOUNTER — Encounter: Payer: Self-pay | Admitting: Hospice and Palliative Medicine

## 2020-09-01 ENCOUNTER — Ambulatory Visit: Payer: BLUE CROSS/BLUE SHIELD | Admitting: Nurse Practitioner

## 2020-09-27 ENCOUNTER — Telehealth: Payer: Self-pay

## 2020-09-27 ENCOUNTER — Encounter: Payer: Self-pay | Admitting: Hospice and Palliative Medicine

## 2020-09-27 ENCOUNTER — Ambulatory Visit (INDEPENDENT_AMBULATORY_CARE_PROVIDER_SITE_OTHER): Payer: BLUE CROSS/BLUE SHIELD | Admitting: Hospice and Palliative Medicine

## 2020-09-27 DIAGNOSIS — R059 Cough, unspecified: Secondary | ICD-10-CM

## 2020-09-27 DIAGNOSIS — J0140 Acute pansinusitis, unspecified: Secondary | ICD-10-CM

## 2020-09-27 MED ORDER — AMOXICILLIN-POT CLAVULANATE 875-125 MG PO TABS: 1.0000 | ORAL_TABLET | Freq: Two times a day (BID) | ORAL | 0 refills | Status: DC

## 2020-09-27 NOTE — Progress Notes (Signed)
[facility] [address] [place], [place] [address]  Internal MEDICINE  Telephone Visit  Patient Name: Kevin Whitehead  [id number]  [id number]  Date of Service: 10/02/2020  I connected with the patient at 1236 by telephone and verified the patients identity using two identifiers.   I discussed the limitations, risks, security and privacy concerns of performing an evaluation and management service by telephone and the availability of in person appointments. I also discussed with the patient that there may be a patient responsible charge related to the service.  The patient expressed understanding and agrees to proceed.    Chief Complaint  Patient presents with  . Telephone Screen    video...[phone]  . Telephone Assessment  . Fever    2 days ago got worse yesterday  . Sore Throat    took home covid test last night NEGATIVE  . Headache  . Fatigue  . Generalized Body Aches    HPI Patient is here today for sick visit Sunday night started having body aches, woke up Monday with sore throat--has been progressively getting worse Started having a fever yesterday of 101 Today has congestion as well as sinus pain He has been taking DayQuil and NyQuil over the counter without relief or improvement  Not immunized against influenza or COVID19 Denies being around anyone sick   Current Medication: Outpatient Encounter Medications as of 09/27/2020  Medication Sig  . amphetamine-dextroamphetamine (ADDERALL) 20 MG tablet Take 1 tablet (20 mg total) by mouth 2 (two) times daily as needed (for focus).  Marland Kitchen buPROPion (WELLBUTRIN SR) 100 MG 12 hr tablet Take 1 tablet (100 mg total) by mouth 2 (two) times daily.  Marland Kitchen docusate sodium (COLACE) 100 MG capsule Take 100 mg by mouth daily.  . meloxicam (MOBIC) 7.5 MG tablet Take 7.5 mg by mouth as needed for pain.  . Multiple Vitamin (MULTIVITAMIN) capsule Take 1 capsule by mouth daily.  Marland Kitchen PROCTO-MED HC 2.5 % rectal cream Place 1  application rectally as needed.   Marland Kitchen amoxicillin-clavulanate (AUGMENTIN) 875-125 MG tablet Take 1 tablet by mouth 2 (two) times daily.   No facility-administered encounter medications on file as of 09/27/2020.    Surgical History: Past Surgical History:  Procedure Laterality Date  . ANKLE SURGERY  2016    Medical History: Past Medical History:  Diagnosis Date  . Attention and concentration deficit   . Hemorrhoid     Family History: Family History  Problem Relation Age of Onset  . COPD Father     Social History   Socioeconomic History  . Marital status: Married    Spouse name: Not on file  . Number of children: Not on file  . Years of education: Not on file  . Highest education level: Not on file  Occupational History  . Not on file  Tobacco Use  . Smoking status: Former Smoker    Types: Cigarettes    Quit date: 2017    Years since quitting: 4.8  . Smokeless tobacco: Never Used  Substance and Sexual Activity  . Alcohol use: No  . Drug use: No  . Sexual activity: Not on file  Other Topics Concern  . Not on file  Social History Narrative  . Not on file   Social Determinants of Health   Financial Resource Strain:   . Difficulty of Paying Living Expenses: Not on file  Food Insecurity:   . Worried About Programme researcher, broadcasting/film/video in the Last Year: Not on file  .  Ran Out of Food in the Last Year: Not on file  Transportation Needs:   . Lack of Transportation (Medical): Not on file  . Lack of Transportation (Non-Medical): Not on file  Physical Activity:   . Days of Exercise per Week: Not on file  . Minutes of Exercise per Session: Not on file  Stress:   . Feeling of Stress : Not on file  Social Connections:   . Frequency of Communication with Friends and Family: Not on file  . Frequency of Social Gatherings with Friends and Family: Not on file  . Attends Religious Services: Not on file  . Active Member of Clubs or Organizations: Not on file  . Attends Tax inspector Meetings: Not on file  . Marital Status: Not on file  Intimate Partner Violence:   . Fear of Current or Ex-Partner: Not on file  . Emotionally Abused: Not on file  . Physically Abused: Not on file  . Sexually Abused: Not on file    Review of Systems  Constitutional: Positive for chills, fatigue and fever. Negative for diaphoresis and unexpected weight change.  HENT: Positive for congestion, sinus pressure, sinus pain and sore throat. Negative for postnasal drip, rhinorrhea and sneezing.   Eyes: Negative for photophobia, redness and visual disturbance.  Respiratory: Positive for cough. Negative for chest tightness and shortness of breath.   Cardiovascular: Negative for chest pain, palpitations and leg swelling.  Gastrointestinal: Negative for abdominal pain, constipation, diarrhea, nausea and vomiting.  Endocrine: Negative for polydipsia and polyphagia.  Genitourinary: Negative for dysuria and frequency.  Musculoskeletal: Negative for arthralgias, back pain, joint swelling and neck pain.  Skin: Negative for rash.  Neurological: Positive for headaches. Negative for dizziness, tremors, weakness, light-headedness and numbness.  Hematological: Negative for adenopathy. Does not bruise/bleed easily.  Psychiatric/Behavioral: Negative for behavioral problems (Depression), sleep disturbance and suicidal ideas. The patient is not nervous/anxious.     Vital Signs: Resp 16   Ht 6' (1.829 m)   Wt 175 lb (79.4 kg)   BMI 23.73 kg/m    Observation/Objective: No acute distress, advised to come to office to be swabbed for influenza, strongly advised to also be tested for COVID.   Assessment/Plan: 1. Acute non-recurrent pansinusitis Flu swab negative Will treat with 10 day course Augmentin--advised to contact office if symptoms worsen or have not improved within 10 days - amoxicillin-clavulanate (AUGMENTIN) 875-125 MG tablet; Take 1 tablet by mouth 2 (two) times daily.  Dispense: 20  tablet; Refill: 0  2. Cough Continue with OTC medication for symptom management  General Counseling: aarron wierzbicki understanding of the findings of today's phone visit and agrees with plan of treatment. I have discussed any further diagnostic evaluation that may be needed or ordered today. We also reviewed his medications today. he has been encouraged to call the office with any questions or concerns that should arise related to todays visit.  Meds ordered this encounter  Medications  . amoxicillin-clavulanate (AUGMENTIN) 875-125 MG tablet    Sig: Take 1 tablet by mouth 2 (two) times daily.    Dispense:  20 tablet    Refill:  0     Time spent: 30 Minutes Time spent includes review of chart, medications, test results and follow-up plan with the patient.  Lubertha Basque Tori Dattilio AGNP-C Internal medicine

## 2020-09-27 NOTE — Telephone Encounter (Signed)
Spoke to pt, informed him flu test was negative and TH sent antibiotics to his pharmacy.

## 2020-10-02 ENCOUNTER — Encounter: Payer: Self-pay | Admitting: Hospice and Palliative Medicine

## 2020-10-03 ENCOUNTER — Encounter: Payer: BLUE CROSS/BLUE SHIELD | Admitting: Hospice and Palliative Medicine

## 2020-10-06 ENCOUNTER — Ambulatory Visit (INDEPENDENT_AMBULATORY_CARE_PROVIDER_SITE_OTHER): Payer: Self-pay

## 2020-10-06 ENCOUNTER — Other Ambulatory Visit: Payer: Self-pay

## 2020-10-06 ENCOUNTER — Ambulatory Visit: Admit: 2020-10-06 | Discharge status: Absent without leave | Payer: BLUE CROSS/BLUE SHIELD

## 2020-10-06 ENCOUNTER — Ambulatory Visit
Admission: EM | Admit: 2020-10-06 | Discharge: 2020-10-06 | Disposition: A | Discharge status: Home / Self Care | Payer: BLUE CROSS/BLUE SHIELD | Attending: Emergency Medicine | Admitting: Emergency Medicine

## 2020-10-06 DIAGNOSIS — M79642 Pain in left hand: Secondary | ICD-10-CM

## 2020-10-06 DIAGNOSIS — S62603B Fracture of unspecified phalanx of left middle finger, initial encounter for open fracture: Secondary | ICD-10-CM | POA: Diagnosis not present

## 2020-10-06 MED ORDER — TRAMADOL HCL 50 MG PO TABS
50.0000 mg | ORAL_TABLET | Freq: Two times a day (BID) | ORAL | 0 refills | Status: AC | PRN
Start: 2020-10-06 — End: 2020-10-11

## 2020-10-06 NOTE — Discharge Instructions (Addendum)
Take OTC Tylenol/ibuprofen as needed for pain Tramadol was prescribed for severe pain Follow RICE instruction that is attached Follow-up with PCP/orthopedic Return or go to ED if you develop any new or worsening symptoms

## 2020-10-06 NOTE — ED Provider Notes (Signed)
[facility]   [id number] 10/06/20 Arrival Time: 1805  Chief Complaint  Patient presents with  . Hand Injury     SUBJECTIVE: History from: patient.  Kevin Whitehead. is a 39 y.o. male presented to the urgent care with a complaint of left hand injury that occurred today.  Developed the symptom after his hand was stuck in the machine at work.  He localizes the pain to the left hand.  He describes the pain as constant and achy.  He has tried OTC medications without relief.  His symptoms are made worse with ROM.  He denies similar symptoms in the past.  Denies chills, fever, nausea, vomiting, diarrhea   ROS: As per HPI.  All other pertinent ROS negative.     Past Medical History:  Diagnosis Date  . Attention and concentration deficit   . Hemorrhoid    Past Surgical History:  Procedure Laterality Date  . ANKLE SURGERY  2016   No Known Allergies No current facility-administered medications on file prior to encounter.   Current Outpatient Medications on File Prior to Encounter  Medication Sig Dispense Refill  . amoxicillin-clavulanate (AUGMENTIN) 875-125 MG tablet Take 1 tablet by mouth 2 (two) times daily. 20 tablet 0  . amphetamine-dextroamphetamine (ADDERALL) 20 MG tablet Take 1 tablet (20 mg total) by mouth 2 (two) times daily as needed (for focus). 60 tablet 0  . buPROPion (WELLBUTRIN SR) 100 MG 12 hr tablet Take 1 tablet (100 mg total) by mouth 2 (two) times daily. 180 tablet 1  . docusate sodium (COLACE) 100 MG capsule Take 100 mg by mouth daily.    . meloxicam (MOBIC) 7.5 MG tablet Take 7.5 mg by mouth as needed for pain.    . Multiple Vitamin (MULTIVITAMIN) capsule Take 1 capsule by mouth daily.    Marland Kitchen PROCTO-MED HC 2.5 % rectal cream Place 1 application rectally as needed.   2   Social History   Socioeconomic History  . Marital status: Married    Spouse name: Not on file  . Number of children: Not on file  . Years of education: Not on file  . Highest  education level: Not on file  Occupational History  . Not on file  Tobacco Use  . Smoking status: Former Smoker    Types: Cigarettes    Quit date: 2017    Years since quitting: 4.8  . Smokeless tobacco: Never Used  Substance and Sexual Activity  . Alcohol use: No  . Drug use: No  . Sexual activity: Not on file  Other Topics Concern  . Not on file  Social History Narrative  . Not on file   Social Determinants of Health   Financial Resource Strain:   . Difficulty of Paying Living Expenses: Not on file  Food Insecurity:   . Worried About Programme researcher, broadcasting/film/video in the Last Year: Not on file  . Ran Out of Food in the Last Year: Not on file  Transportation Needs:   . Lack of Transportation (Medical): Not on file  . Lack of Transportation (Non-Medical): Not on file  Physical Activity:   . Days of Exercise per Week: Not on file  . Minutes of Exercise per Session: Not on file  Stress:   . Feeling of Stress : Not on file  Social Connections:   . Frequency of Communication with Friends and Family: Not on file  . Frequency of Social Gatherings with Friends and Family: Not on file  . Attends  Religious Services: Not on file  . Active Member of Clubs or Organizations: Not on file  . Attends Banker Meetings: Not on file  . Marital Status: Not on file  Intimate Partner Violence:   . Fear of Current or Ex-Partner: Not on file  . Emotionally Abused: Not on file  . Physically Abused: Not on file  . Sexually Abused: Not on file   Family History  Problem Relation Age of Onset  . COPD Father     OBJECTIVE:  Vitals:   10/06/20 1846  BP: 109/70  Pulse: 85  Resp: 16  Temp: 97.8 F (36.6 C)  SpO2: 97%     Physical Exam Vitals and nursing note reviewed.  Constitutional:      General: He is not in acute distress.    Appearance: Normal appearance. He is normal weight. He is not ill-appearing, toxic-appearing or diaphoretic.  Cardiovascular:     Rate and Rhythm:  Normal rate and regular rhythm.     Pulses: Normal pulses.     Heart sounds: Normal heart sounds. No murmur heard.  No friction rub. No gallop.   Pulmonary:     Effort: Pulmonary effort is normal. No respiratory distress.     Breath sounds: Normal breath sounds. No stridor. No wheezing, rhonchi or rales.  Chest:     Chest wall: No tenderness.  Musculoskeletal:        General: Tenderness present.     Right hand: Normal.     Left hand: Tenderness present.     Comments: The left hand is beginning obvious asymmetry or deformity when compared to the right hand.there is no ecchymosis, open wound, lesion, surface trauma, swelling or warmth present.  Limited active motion due to pain.  Neurovascular status intact.  Neurological:     Mental Status: He is alert and oriented to person, place, and time.      LABS:  No results found for this or any previous visit (from the past 24 hour(s)).   RADIOLOGY:  DG Hand Complete Left  Result Date: 10/06/2020 CLINICAL DATA:  Pain after trauma yesterday. Hyperextension is second through fifth digits. EXAM: LEFT HAND - COMPLETE 3+ VIEW COMPARISON:  None. FINDINGS: Irregularity along the volar aspect of the third middle phalanx proximally with an associated lucency. This no other abnormalities. IMPRESSION: Findings are concerning for an unusual acute fracture through the volar aspect of the middle third phalanx proximally. The volar plate appears to be involved. Electronically Signed   By: Gerome Sam III M.D   On: 10/06/2020 19:28     The left hand x-ray is positive for acute fracture with a volar aspect of the middle third phalanx.  I have reviewed the x-ray myself and the radiologist interpretation.  I am in agreement with the radiologist interpretation.  ASSESSMENT & PLAN:  1. Left hand pain   2. Open nondisplaced fracture of phalanx of left middle finger, unspecified phalanx, initial encounter     Meds ordered this encounter  Medications  .  traMADol (ULTRAM) 50 MG tablet    Sig: Take 1 tablet (50 mg total) by mouth every 12 (twelve) hours as needed for up to 5 days.    Dispense:  10 tablet    Refill:  0    Discharge instructions  Take OTC Tylenol/ibuprofen as needed for moderate pain Tramadol was prescribed for severe pain Follow RICE instruction that is attached Follow-up with PCP/orthopedic Return or go to ED if you develop any new  or worsening symptoms  Reviewed expectations re: course of current medical issues. Questions answered. Outlined signs and symptoms indicating need for more acute intervention. Patient verbalized understanding. After Visit Summary given.    PDMP reviewed during this encounter.      Durward Parcel, FNP 10/06/20 1946

## 2020-10-30 ENCOUNTER — Inpatient Hospital Stay (HOSPITAL_COMMUNITY): Payer: BC Managed Care – PPO

## 2020-10-30 ENCOUNTER — Emergency Department
Admission: EM | Admit: 2020-10-30 | Discharge: 2020-10-30 | Disposition: A | Discharge status: Home / Self Care | Payer: BC Managed Care – PPO | Attending: Emergency Medicine | Admitting: Emergency Medicine

## 2020-10-30 ENCOUNTER — Other Ambulatory Visit: Payer: Self-pay

## 2020-10-30 ENCOUNTER — Emergency Department: Payer: BC Managed Care – PPO

## 2020-10-30 ENCOUNTER — Inpatient Hospital Stay (HOSPITAL_COMMUNITY): Payer: BC Managed Care – PPO | Admitting: Certified Registered Nurse Anesthetist

## 2020-10-30 ENCOUNTER — Inpatient Hospital Stay (HOSPITAL_COMMUNITY)
Admission: EM | Admit: 2020-10-30 | Discharge: 2020-11-08 | DRG: 029 | Disposition: A | Discharge status: Rehabilitation Facility | Payer: BC Managed Care – PPO | Source: Other Acute Inpatient Hospital | Attending: Neurosurgery | Admitting: Neurosurgery

## 2020-10-30 ENCOUNTER — Encounter (HOSPITAL_COMMUNITY)
Admission: EM | Disposition: A | Discharge status: Rehabilitation Facility | Payer: Self-pay | Source: Other Acute Inpatient Hospital | Attending: Neurosurgery

## 2020-10-30 ENCOUNTER — Emergency Department (HOSPITAL_COMMUNITY): Payer: BC Managed Care – PPO

## 2020-10-30 DIAGNOSIS — J320 Chronic maxillary sinusitis: Secondary | ICD-10-CM | POA: Diagnosis not present

## 2020-10-30 DIAGNOSIS — M5441 Lumbago with sciatica, right side: Secondary | ICD-10-CM

## 2020-10-30 DIAGNOSIS — S3993XA Unspecified injury of pelvis, initial encounter: Secondary | ICD-10-CM | POA: Diagnosis not present

## 2020-10-30 DIAGNOSIS — S82831A Other fracture of upper and lower end of right fibula, initial encounter for closed fracture: Secondary | ICD-10-CM | POA: Diagnosis not present

## 2020-10-30 DIAGNOSIS — K649 Unspecified hemorrhoids: Secondary | ICD-10-CM | POA: Diagnosis present

## 2020-10-30 DIAGNOSIS — Z79899 Other long term (current) drug therapy: Secondary | ICD-10-CM | POA: Diagnosis not present

## 2020-10-30 DIAGNOSIS — E46 Unspecified protein-calorie malnutrition: Secondary | ICD-10-CM | POA: Diagnosis not present

## 2020-10-30 DIAGNOSIS — S32001A Stable burst fracture of unspecified lumbar vertebra, initial encounter for closed fracture: Secondary | ICD-10-CM | POA: Diagnosis present

## 2020-10-30 DIAGNOSIS — Y92410 Unspecified street and highway as the place of occurrence of the external cause: Secondary | ICD-10-CM

## 2020-10-30 DIAGNOSIS — Z87891 Personal history of nicotine dependence: Secondary | ICD-10-CM | POA: Diagnosis not present

## 2020-10-30 DIAGNOSIS — Z419 Encounter for procedure for purposes other than remedying health state, unspecified: Secondary | ICD-10-CM

## 2020-10-30 DIAGNOSIS — N319 Neuromuscular dysfunction of bladder, unspecified: Secondary | ICD-10-CM | POA: Diagnosis not present

## 2020-10-30 DIAGNOSIS — R339 Retention of urine, unspecified: Secondary | ICD-10-CM | POA: Diagnosis not present

## 2020-10-30 DIAGNOSIS — K644 Residual hemorrhoidal skin tags: Secondary | ICD-10-CM | POA: Diagnosis not present

## 2020-10-30 DIAGNOSIS — Z20822 Contact with and (suspected) exposure to covid-19: Secondary | ICD-10-CM | POA: Insufficient documentation

## 2020-10-30 DIAGNOSIS — Z4789 Encounter for other orthopedic aftercare: Secondary | ICD-10-CM | POA: Diagnosis not present

## 2020-10-30 DIAGNOSIS — M48061 Spinal stenosis, lumbar region without neurogenic claudication: Secondary | ICD-10-CM | POA: Diagnosis not present

## 2020-10-30 DIAGNOSIS — M79604 Pain in right leg: Secondary | ICD-10-CM | POA: Diagnosis not present

## 2020-10-30 DIAGNOSIS — R0689 Other abnormalities of breathing: Secondary | ICD-10-CM | POA: Diagnosis not present

## 2020-10-30 DIAGNOSIS — S3991XA Unspecified injury of abdomen, initial encounter: Secondary | ICD-10-CM | POA: Diagnosis not present

## 2020-10-30 DIAGNOSIS — M25571 Pain in right ankle and joints of right foot: Secondary | ICD-10-CM | POA: Diagnosis not present

## 2020-10-30 DIAGNOSIS — R41840 Attention and concentration deficit: Secondary | ICD-10-CM | POA: Diagnosis not present

## 2020-10-30 DIAGNOSIS — D62 Acute posthemorrhagic anemia: Secondary | ICD-10-CM | POA: Diagnosis not present

## 2020-10-30 DIAGNOSIS — F909 Attention-deficit hyperactivity disorder, unspecified type: Secondary | ICD-10-CM | POA: Diagnosis present

## 2020-10-30 DIAGNOSIS — Z981 Arthrodesis status: Secondary | ICD-10-CM | POA: Diagnosis not present

## 2020-10-30 DIAGNOSIS — S32010A Wedge compression fracture of first lumbar vertebra, initial encounter for closed fracture: Secondary | ICD-10-CM | POA: Diagnosis not present

## 2020-10-30 DIAGNOSIS — S299XXA Unspecified injury of thorax, initial encounter: Secondary | ICD-10-CM | POA: Diagnosis not present

## 2020-10-30 DIAGNOSIS — M4325 Fusion of spine, thoracolumbar region: Secondary | ICD-10-CM | POA: Diagnosis not present

## 2020-10-30 DIAGNOSIS — M25579 Pain in unspecified ankle and joints of unspecified foot: Secondary | ICD-10-CM

## 2020-10-30 DIAGNOSIS — S32012A Unstable burst fracture of first lumbar vertebra, initial encounter for closed fracture: Secondary | ICD-10-CM | POA: Insufficient documentation

## 2020-10-30 DIAGNOSIS — J0110 Acute frontal sinusitis, unspecified: Secondary | ICD-10-CM | POA: Diagnosis not present

## 2020-10-30 DIAGNOSIS — M62838 Other muscle spasm: Secondary | ICD-10-CM | POA: Diagnosis not present

## 2020-10-30 DIAGNOSIS — G9609 Other spinal cerebrospinal fluid leak: Secondary | ICD-10-CM | POA: Diagnosis present

## 2020-10-30 DIAGNOSIS — S3992XA Unspecified injury of lower back, initial encounter: Secondary | ICD-10-CM | POA: Diagnosis not present

## 2020-10-30 DIAGNOSIS — S32011D Stable burst fracture of first lumbar vertebra, subsequent encounter for fracture with routine healing: Secondary | ICD-10-CM | POA: Diagnosis not present

## 2020-10-30 DIAGNOSIS — G834 Cauda equina syndrome: Secondary | ICD-10-CM | POA: Diagnosis present

## 2020-10-30 DIAGNOSIS — S0990XA Unspecified injury of head, initial encounter: Secondary | ICD-10-CM | POA: Diagnosis not present

## 2020-10-30 DIAGNOSIS — G8929 Other chronic pain: Secondary | ICD-10-CM | POA: Diagnosis not present

## 2020-10-30 DIAGNOSIS — M792 Neuralgia and neuritis, unspecified: Secondary | ICD-10-CM | POA: Diagnosis not present

## 2020-10-30 DIAGNOSIS — R7401 Elevation of levels of liver transaminase levels: Secondary | ICD-10-CM | POA: Diagnosis not present

## 2020-10-30 DIAGNOSIS — K592 Neurogenic bowel, not elsewhere classified: Secondary | ICD-10-CM | POA: Diagnosis not present

## 2020-10-30 DIAGNOSIS — J0100 Acute maxillary sinusitis, unspecified: Secondary | ICD-10-CM | POA: Diagnosis not present

## 2020-10-30 DIAGNOSIS — F321 Major depressive disorder, single episode, moderate: Secondary | ICD-10-CM | POA: Diagnosis not present

## 2020-10-30 DIAGNOSIS — E8809 Other disorders of plasma-protein metabolism, not elsewhere classified: Secondary | ICD-10-CM | POA: Diagnosis not present

## 2020-10-30 DIAGNOSIS — M545 Low back pain, unspecified: Secondary | ICD-10-CM | POA: Diagnosis not present

## 2020-10-30 DIAGNOSIS — S34101A Unspecified injury to L1 level of lumbar spinal cord, initial encounter: Secondary | ICD-10-CM | POA: Diagnosis not present

## 2020-10-30 DIAGNOSIS — S99911A Unspecified injury of right ankle, initial encounter: Secondary | ICD-10-CM | POA: Diagnosis not present

## 2020-10-30 DIAGNOSIS — R748 Abnormal levels of other serum enzymes: Secondary | ICD-10-CM | POA: Diagnosis not present

## 2020-10-30 DIAGNOSIS — Z825 Family history of asthma and other chronic lower respiratory diseases: Secondary | ICD-10-CM | POA: Diagnosis not present

## 2020-10-30 DIAGNOSIS — G8918 Other acute postprocedural pain: Secondary | ICD-10-CM | POA: Diagnosis not present

## 2020-10-30 DIAGNOSIS — M7989 Other specified soft tissue disorders: Secondary | ICD-10-CM | POA: Diagnosis not present

## 2020-10-30 DIAGNOSIS — S32011A Stable burst fracture of first lumbar vertebra, initial encounter for closed fracture: Secondary | ICD-10-CM | POA: Diagnosis not present

## 2020-10-30 DIAGNOSIS — S129XXA Fracture of neck, unspecified, initial encounter: Secondary | ICD-10-CM | POA: Diagnosis not present

## 2020-10-30 DIAGNOSIS — R52 Pain, unspecified: Secondary | ICD-10-CM | POA: Diagnosis not present

## 2020-10-30 DIAGNOSIS — R9431 Abnormal electrocardiogram [ECG] [EKG]: Secondary | ICD-10-CM | POA: Diagnosis not present

## 2020-10-30 DIAGNOSIS — M1731 Unilateral post-traumatic osteoarthritis, right knee: Secondary | ICD-10-CM | POA: Diagnosis not present

## 2020-10-30 HISTORY — PX: POSTERIOR LUMBAR FUSION 4 LEVEL: SHX6037

## 2020-10-30 LAB — COMPREHENSIVE METABOLIC PANEL
ALT: 20 U/L (ref 0–44)
AST: 22 U/L (ref 15–41)
Albumin: 4.1 g/dL (ref 3.5–5.0)
Alkaline Phosphatase: 81 U/L (ref 38–126)
Anion gap: 9 (ref 5–15)
BUN: 22 mg/dL — ABNORMAL HIGH (ref 6–20)
CO2: 29 mmol/L (ref 22–32)
Calcium: 9.3 mg/dL (ref 8.9–10.3)
Chloride: 102 mmol/L (ref 98–111)
Creatinine, Ser: 0.82 mg/dL (ref 0.61–1.24)
GFR, Estimated: >60 mL/min (ref >60)
Glucose, Bld: 167 mg/dL — ABNORMAL HIGH (ref 70–99)
Potassium: 3.9 mmol/L (ref 3.5–5.1)
Sodium: 140 mmol/L (ref 135–145)
Total Bilirubin: 0.8 mg/dL (ref 0.3–1.2)
Total Protein: 7.5 g/dL (ref 6.5–8.1)

## 2020-10-30 LAB — CBC WITH DIFFERENTIAL/PLATELET
Abs Immature Granulocytes: 0.05 10*3/uL (ref 0.00–0.07)
Basophils Absolute: 0 10*3/uL (ref 0.0–0.1)
Basophils Relative: 1 %
Eosinophils Absolute: 0.2 10*3/uL (ref 0.0–0.5)
Eosinophils Relative: 3 %
HCT: 38.8 % — ABNORMAL LOW (ref 39.0–52.0)
Hemoglobin: 14.1 g/dL (ref 13.0–17.0)
Immature Granulocytes: 1 %
Lymphocytes Relative: 31 %
Lymphs Abs: 1.8 10*3/uL (ref 0.7–4.0)
MCH: 31.8 pg (ref 26.0–34.0)
MCHC: 36.3 g/dL — ABNORMAL HIGH (ref 30.0–36.0)
MCV: 87.4 fL (ref 80.0–100.0)
Monocytes Absolute: 0.5 10*3/uL (ref 0.1–1.0)
Monocytes Relative: 9 %
Neutro Abs: 3.4 10*3/uL (ref 1.7–7.7)
Neutrophils Relative %: 55 %
Platelets: 298 10*3/uL (ref 150–400)
RBC: 4.44 MIL/uL (ref 4.22–5.81)
RDW: 11.8 % (ref 11.5–15.5)
WBC: 6 10*3/uL (ref 4.0–10.5)
nRBC: 0 % (ref 0.0–0.2)

## 2020-10-30 LAB — ETHANOL: Alcohol, Ethyl (B): <10 mg/dL (ref <10)

## 2020-10-30 LAB — RESP PANEL BY RT-PCR (FLU A&B, COVID) ARPGX2
Influenza A by PCR: NEGATIVE
Influenza B by PCR: NEGATIVE
SARS Coronavirus 2 by RT PCR: NEGATIVE

## 2020-10-30 LAB — TROPONIN I (HIGH SENSITIVITY): Troponin I (High Sensitivity): 7 ng/L (ref <18)

## 2020-10-30 LAB — TYPE AND SCREEN
ABO/RH(D): A NEG
Antibody Screen: NEGATIVE

## 2020-10-30 LAB — LIPASE, BLOOD: Lipase: 41 U/L (ref 11–51)

## 2020-10-30 LAB — PROTIME-INR
INR: 1.1 (ref 0.8–1.2)
Prothrombin Time: 13.5 seconds (ref 11.4–15.2)

## 2020-10-30 SURGERY — POSTERIOR LUMBAR FUSION 4 LEVEL
Anesthesia: General | Site: Back

## 2020-10-30 MED ORDER — HYDROMORPHONE HCL 1 MG/ML IJ SOLN
0.2500 mg | INTRAMUSCULAR | Status: DC | PRN
Start: 2020-10-30 — End: 2020-10-31

## 2020-10-30 MED ORDER — THROMBIN 5000 UNITS EX SOLR
OROMUCOSAL | Status: DC | PRN
  Administered 2020-10-30 (×2): 5 mL via TOPICAL

## 2020-10-30 MED ORDER — HYDROMORPHONE HCL 1 MG/ML IJ SOLN
INTRAMUSCULAR | Status: DC | PRN
  Administered 2020-10-30: .5 mg via INTRAVENOUS

## 2020-10-30 MED ORDER — PHENYLEPHRINE HCL-NACL 10-0.9 MG/250ML-% IV SOLN
INTRAVENOUS | Status: AC
  Filled 2020-10-30: qty 250

## 2020-10-30 MED ORDER — HYDROMORPHONE HCL 1 MG/ML IJ SOLN
INTRAMUSCULAR | Status: AC
  Filled 2020-10-30: qty 0.5

## 2020-10-30 MED ORDER — ONDANSETRON HCL 4 MG/2ML IJ SOLN
INTRAMUSCULAR | Status: DC | PRN
  Administered 2020-10-30: 4 mg via INTRAVENOUS

## 2020-10-30 MED ORDER — ACETAMINOPHEN 10 MG/ML IV SOLN
1000.0000 mg | Freq: Once | INTRAVENOUS | Status: DC | PRN
Start: 2020-10-30 — End: 2020-10-31

## 2020-10-30 MED ORDER — HYDROMORPHONE HCL 1 MG/ML IJ SOLN
0.5000 mg | INTRAMUSCULAR | Status: DC | PRN
  Administered 2020-10-30 (×4): 1 mg via INTRAVENOUS
  Filled 2020-10-30 (×5): qty 1

## 2020-10-30 MED ORDER — IOHEXOL 300 MG/ML  SOLN
100.0000 mL | Freq: Once | INTRAMUSCULAR | Status: AC | PRN
  Administered 2020-10-30: 100 mL via INTRAVENOUS

## 2020-10-30 MED ORDER — DIPHENHYDRAMINE HCL 50 MG/ML IJ SOLN
INTRAMUSCULAR | Status: DC | PRN
  Administered 2020-10-30: 6.25 mg via INTRAVENOUS

## 2020-10-30 MED ORDER — LIDOCAINE IN D5W 4-5 MG/ML-% IV SOLN
INTRAVENOUS | Status: DC | PRN
  Administered 2020-10-30: 25 ug/kg/min via INTRAVENOUS

## 2020-10-30 MED ORDER — HYDROMORPHONE HCL 1 MG/ML IJ SOLN
1.0000 mg | Freq: Once | INTRAMUSCULAR | Status: AC
  Administered 2020-10-30: 1 mg via INTRAVENOUS
  Filled 2020-10-30: qty 1

## 2020-10-30 MED ORDER — ONDANSETRON HCL 4 MG/2ML IJ SOLN
INTRAMUSCULAR | Status: AC
  Filled 2020-10-30: qty 2

## 2020-10-30 MED ORDER — LACTATED RINGERS IV SOLN: INTRAVENOUS | Status: DC | PRN

## 2020-10-30 MED ORDER — THROMBIN 5000 UNITS EX SOLR
CUTANEOUS | Status: AC
  Filled 2020-10-30: qty 5000

## 2020-10-30 MED ORDER — SODIUM CHLORIDE 0.9 % IV SOLN: INTRAVENOUS | Status: DC | PRN

## 2020-10-30 MED ORDER — MIDAZOLAM HCL 5 MG/5ML IJ SOLN
INTRAMUSCULAR | Status: DC | PRN
  Administered 2020-10-30: 2 mg via INTRAVENOUS

## 2020-10-30 MED ORDER — BUPIVACAINE HCL (PF) 0.25 % IJ SOLN
INTRAMUSCULAR | Status: DC | PRN
  Administered 2020-10-30: 30 mL

## 2020-10-30 MED ORDER — FENTANYL CITRATE (PF) 250 MCG/5ML IJ SOLN
INTRAMUSCULAR | Status: AC
  Filled 2020-10-30: qty 5

## 2020-10-30 MED ORDER — TRANEXAMIC ACID-NACL 1000-0.7 MG/100ML-% IV SOLN
INTRAVENOUS | Status: AC
  Filled 2020-10-30: qty 100

## 2020-10-30 MED ORDER — THROMBIN 20000 UNITS EX SOLR
CUTANEOUS | Status: DC | PRN
  Administered 2020-10-30: 20 mL via TOPICAL

## 2020-10-30 MED ORDER — DEXAMETHASONE SODIUM PHOSPHATE 10 MG/ML IJ SOLN
10.0000 mg | Freq: Once | INTRAMUSCULAR | Status: AC
  Administered 2020-10-30: 10 mg via INTRAVENOUS
  Filled 2020-10-30: qty 1

## 2020-10-30 MED ORDER — PROPOFOL 10 MG/ML IV BOLUS
INTRAVENOUS | Status: AC
  Filled 2020-10-30: qty 20

## 2020-10-30 MED ORDER — ACETAMINOPHEN 160 MG/5ML PO SOLN
325.0000 mg | Freq: Once | ORAL | Status: DC | PRN
Start: 2020-10-30 — End: 2020-10-31

## 2020-10-30 MED ORDER — MORPHINE SULFATE (PF) 4 MG/ML IV SOLN
INTRAVENOUS | Status: AC
  Filled 2020-10-30: qty 1

## 2020-10-30 MED ORDER — LIDOCAINE IN D5W 4-5 MG/ML-% IV SOLN
INTRAVENOUS | Status: AC
  Filled 2020-10-30: qty 500

## 2020-10-30 MED ORDER — SUCCINYLCHOLINE CHLORIDE 200 MG/10ML IV SOSY
PREFILLED_SYRINGE | INTRAVENOUS | Status: DC | PRN
  Administered 2020-10-30: 130 mg via INTRAVENOUS

## 2020-10-30 MED ORDER — ACETAMINOPHEN 10 MG/ML IV SOLN
INTRAVENOUS | Status: AC
  Filled 2020-10-30: qty 100

## 2020-10-30 MED ORDER — IOHEXOL 300 MG/ML  SOLN
50.0000 mL | Freq: Once | INTRAMUSCULAR | Status: AC | PRN
  Administered 2020-10-30: 50 mL via INTRAVENOUS

## 2020-10-30 MED ORDER — FENTANYL CITRATE (PF) 250 MCG/5ML IJ SOLN
INTRAMUSCULAR | Status: DC | PRN
  Administered 2020-10-30: 50 ug via INTRAVENOUS
  Administered 2020-10-30: 100 ug via INTRAVENOUS
  Administered 2020-10-30: 50 ug via INTRAVENOUS
  Administered 2020-10-30 (×3): 100 ug via INTRAVENOUS

## 2020-10-30 MED ORDER — HYDROMORPHONE HCL 1 MG/ML IJ SOLN: 1.0000 mg | Freq: Once | INTRAMUSCULAR | Status: AC

## 2020-10-30 MED ORDER — AMISULPRIDE (ANTIEMETIC) 5 MG/2ML IV SOLN: 10.0000 mg | Freq: Once | INTRAVENOUS | Status: DC | PRN

## 2020-10-30 MED ORDER — HYDROMORPHONE HCL 1 MG/ML IJ SOLN
1.0000 mg | Freq: Once | INTRAMUSCULAR | Status: AC
  Administered 2020-10-30: 1 mg via INTRAVENOUS

## 2020-10-30 MED ORDER — THROMBIN 20000 UNITS EX SOLR
CUTANEOUS | Status: AC
  Filled 2020-10-30: qty 20000

## 2020-10-30 MED ORDER — MORPHINE SULFATE (PF) 4 MG/ML IV SOLN
4.0000 mg | Freq: Once | INTRAVENOUS | Status: AC
  Administered 2020-10-30: 4 mg via INTRAVENOUS
  Filled 2020-10-30: qty 1

## 2020-10-30 MED ORDER — ACETAMINOPHEN 325 MG PO TABS
325.0000 mg | ORAL_TABLET | Freq: Once | ORAL | Status: DC | PRN
Start: 2020-10-30 — End: 2020-10-31

## 2020-10-30 MED ORDER — MEPERIDINE HCL 25 MG/ML IJ SOLN: 6.2500 mg | INTRAMUSCULAR | Status: DC | PRN

## 2020-10-30 MED ORDER — MIDAZOLAM HCL 2 MG/2ML IJ SOLN
INTRAMUSCULAR | Status: AC
  Filled 2020-10-30: qty 2

## 2020-10-30 MED ORDER — KETOROLAC TROMETHAMINE 30 MG/ML IJ SOLN
INTRAMUSCULAR | Status: DC | PRN
  Administered 2020-10-30: 30 mg via INTRAVENOUS

## 2020-10-30 MED ORDER — ROCURONIUM BROMIDE 10 MG/ML (PF) SYRINGE
PREFILLED_SYRINGE | INTRAVENOUS | Status: DC | PRN
  Administered 2020-10-30: 80 mg via INTRAVENOUS
  Administered 2020-10-30: 50 mg via INTRAVENOUS
  Administered 2020-10-30 (×2): 20 mg via INTRAVENOUS

## 2020-10-30 MED ORDER — DEXMEDETOMIDINE (PRECEDEX) IN NS 20 MCG/5ML (4 MCG/ML) IV SYRINGE
PREFILLED_SYRINGE | INTRAVENOUS | Status: DC | PRN
  Administered 2020-10-30 (×2): 8 ug via INTRAVENOUS
  Administered 2020-10-30: 4 ug via INTRAVENOUS

## 2020-10-30 MED ORDER — 0.9 % SODIUM CHLORIDE (POUR BTL) OPTIME
TOPICAL | Status: DC | PRN
  Administered 2020-10-30: 1000 mL

## 2020-10-30 MED ORDER — DEXAMETHASONE SODIUM PHOSPHATE 10 MG/ML IJ SOLN
INTRAMUSCULAR | Status: AC
  Filled 2020-10-30: qty 1

## 2020-10-30 MED ORDER — LIDOCAINE 2% (20 MG/ML) 5 ML SYRINGE
INTRAMUSCULAR | Status: DC | PRN
  Administered 2020-10-30: 60 mg via INTRAVENOUS

## 2020-10-30 MED ORDER — MORPHINE SULFATE (PF) 4 MG/ML IV SOLN
4.0000 mg | INTRAVENOUS | Status: DC | PRN
  Administered 2020-10-30: 4 mg via INTRAVENOUS

## 2020-10-30 MED ORDER — KETAMINE HCL 50 MG/5ML IJ SOSY
PREFILLED_SYRINGE | INTRAMUSCULAR | Status: AC
  Filled 2020-10-30: qty 5

## 2020-10-30 MED ORDER — LACTATED RINGERS IV SOLN: INTRAVENOUS | Status: DC

## 2020-10-30 MED ORDER — CEFAZOLIN SODIUM-DEXTROSE 2-4 GM/100ML-% IV SOLN
2.0000 g | INTRAVENOUS | Status: AC
  Administered 2020-10-30: 2 g via INTRAVENOUS

## 2020-10-30 MED ORDER — KETAMINE HCL 50 MG/ML IJ SOLN
INTRAMUSCULAR | Status: DC | PRN
  Administered 2020-10-30: 10 mg via INTRAMUSCULAR
  Administered 2020-10-30 (×2): 20 mg via INTRAMUSCULAR

## 2020-10-30 MED ORDER — KETAMINE HCL 50 MG/5ML IJ SOSY
0.3000 mg/kg | PREFILLED_SYRINGE | Freq: Once | INTRAMUSCULAR | Status: AC
  Administered 2020-10-30: 23 mg via INTRAVENOUS
  Filled 2020-10-30: qty 5

## 2020-10-30 MED ORDER — ONDANSETRON HCL 4 MG/2ML IJ SOLN: 4.0000 mg | Freq: Four times a day (QID) | INTRAMUSCULAR | Status: DC | PRN

## 2020-10-30 MED ORDER — PHENYLEPHRINE 40 MCG/ML (10ML) SYRINGE FOR IV PUSH (FOR BLOOD PRESSURE SUPPORT)
PREFILLED_SYRINGE | INTRAVENOUS | Status: DC | PRN
  Administered 2020-10-30: 80 ug via INTRAVENOUS

## 2020-10-30 MED ORDER — CEFAZOLIN SODIUM-DEXTROSE 2-4 GM/100ML-% IV SOLN
INTRAVENOUS | Status: AC
  Filled 2020-10-30: qty 100

## 2020-10-30 MED ORDER — PROPOFOL 10 MG/ML IV BOLUS
INTRAVENOUS | Status: DC | PRN
  Administered 2020-10-30: 20 mg via INTRAVENOUS
  Administered 2020-10-30: 150 mg via INTRAVENOUS
  Administered 2020-10-30: 50 mg via INTRAVENOUS

## 2020-10-30 MED ORDER — HYDROMORPHONE HCL 1 MG/ML IJ SOLN
INTRAMUSCULAR | Status: AC
  Administered 2020-10-30: 1 mg via INTRAVENOUS
  Filled 2020-10-30: qty 1

## 2020-10-30 MED ORDER — TRANEXAMIC ACID-NACL 1000-0.7 MG/100ML-% IV SOLN
1000.0000 mg | INTRAVENOUS | Status: AC
  Administered 2020-10-30: 1000 mg via INTRAVENOUS

## 2020-10-30 MED ORDER — DEXAMETHASONE SODIUM PHOSPHATE 10 MG/ML IJ SOLN
INTRAMUSCULAR | Status: DC | PRN
  Administered 2020-10-30: 10 mg via INTRAVENOUS

## 2020-10-30 MED ORDER — THROMBIN 5000 UNITS EX SOLR
CUTANEOUS | Status: AC
  Filled 2020-10-30: qty 10000

## 2020-10-30 MED ORDER — ACETAMINOPHEN 10 MG/ML IV SOLN
INTRAVENOUS | Status: DC | PRN
  Administered 2020-10-30: 1000 mg via INTRAVENOUS

## 2020-10-30 MED ORDER — HYDROMORPHONE HCL 1 MG/ML IJ SOLN
INTRAMUSCULAR | Status: AC
  Filled 2020-10-30: qty 1

## 2020-10-30 MED ORDER — BUPIVACAINE HCL (PF) 0.25 % IJ SOLN
INTRAMUSCULAR | Status: AC
  Filled 2020-10-30: qty 30

## 2020-10-30 SURGICAL SUPPLY — 71 items
ADH SKN CLS APL DERMABOND .7 (GAUZE/BANDAGES/DRESSINGS) ×1
APL SKNCLS STERI-STRIP NONHPOA (GAUZE/BANDAGES/DRESSINGS) ×1
BAG DECANTER FOR FLEXI CONT (MISCELLANEOUS) ×2 IMPLANT
BENZOIN TINCTURE PRP APPL 2/3 (GAUZE/BANDAGES/DRESSINGS) ×2 IMPLANT
BLADE CLIPPER SURG (BLADE) IMPLANT
BUR CUTTER 7.0 ROUND (BURR) ×1 IMPLANT
BUR MATCHSTICK NEURO 3.0 LAGG (BURR) ×2 IMPLANT
CANISTER SUCT 3000ML PPV (MISCELLANEOUS) ×2 IMPLANT
CARTRIDGE OIL MAESTRO DRILL (MISCELLANEOUS) ×1 IMPLANT
CLSR STERI-STRIP ANTIMIC 1/2X4 (GAUZE/BANDAGES/DRESSINGS) ×2 IMPLANT
CNTNR URN SCR LID CUP LEK RST (MISCELLANEOUS) ×1 IMPLANT
CONT SPEC 4OZ STRL OR WHT (MISCELLANEOUS) ×2
COVER BACK TABLE 60X90IN (DRAPES) ×2 IMPLANT
COVER WAND RF STERILE (DRAPES) ×2 IMPLANT
DECANTER SPIKE VIAL GLASS SM (MISCELLANEOUS) ×2 IMPLANT
DERMABOND ADVANCED (GAUZE/BANDAGES/DRESSINGS) ×1
DERMABOND ADVANCED .7 DNX12 (GAUZE/BANDAGES/DRESSINGS) ×1 IMPLANT
DIFFUSER DRILL AIR PNEUMATIC (MISCELLANEOUS) ×2 IMPLANT
DRAPE C-ARM 42X72 X-RAY (DRAPES) ×4 IMPLANT
DRAPE HALF SHEET 40X57 (DRAPES) IMPLANT
DRAPE LAPAROTOMY 100X72X124 (DRAPES) ×2 IMPLANT
DRAPE SURG 17X23 STRL (DRAPES) ×8 IMPLANT
DRSG OPSITE POSTOP 4X10 (GAUZE/BANDAGES/DRESSINGS) ×1 IMPLANT
DRSG OPSITE POSTOP 4X6 (GAUZE/BANDAGES/DRESSINGS) ×2 IMPLANT
DURAPREP 26ML APPLICATOR (WOUND CARE) ×2 IMPLANT
DURASEAL SPINE SEALANT 3ML (MISCELLANEOUS) ×1 IMPLANT
ELECT REM PT RETURN 9FT ADLT (ELECTROSURGICAL) ×2
ELECTRODE REM PT RTRN 9FT ADLT (ELECTROSURGICAL) ×1 IMPLANT
EVACUATOR 1/8 PVC DRAIN (DRAIN) IMPLANT
GAUZE 4X4 16PLY RFD (DISPOSABLE) IMPLANT
GAUZE SPONGE 4X4 12PLY STRL (GAUZE/BANDAGES/DRESSINGS) IMPLANT
GLOVE BIO SURGEON STRL SZ 6.5 (GLOVE) ×2 IMPLANT
GLOVE BIO SURGEON STRL SZ7 (GLOVE) ×3 IMPLANT
GLOVE BIOGEL PI IND STRL 6.5 (GLOVE) ×1 IMPLANT
GLOVE BIOGEL PI IND STRL 7.5 (GLOVE) IMPLANT
GLOVE BIOGEL PI INDICATOR 6.5 (GLOVE) ×1
GLOVE BIOGEL PI INDICATOR 7.5 (GLOVE) ×1
GLOVE ECLIPSE 9.0 STRL (GLOVE) ×4 IMPLANT
GOWN STRL REUS W/ TWL LRG LVL3 (GOWN DISPOSABLE) IMPLANT
GOWN STRL REUS W/ TWL XL LVL3 (GOWN DISPOSABLE) ×2 IMPLANT
GOWN STRL REUS W/TWL 2XL LVL3 (GOWN DISPOSABLE) IMPLANT
GOWN STRL REUS W/TWL LRG LVL3 (GOWN DISPOSABLE) ×4
GOWN STRL REUS W/TWL XL LVL3 (GOWN DISPOSABLE) ×4
GRAFT DURAGEN MATRIX 1WX1L (Tissue) ×1 IMPLANT
KIT BASIN OR (CUSTOM PROCEDURE TRAY) ×2 IMPLANT
KIT TURNOVER KIT B (KITS) ×2 IMPLANT
MILL MEDIUM DISP (BLADE) ×2 IMPLANT
NEEDLE HYPO 22GX1.5 SAFETY (NEEDLE) ×2 IMPLANT
NS IRRIG 1000ML POUR BTL (IV SOLUTION) ×2 IMPLANT
OIL CARTRIDGE MAESTRO DRILL (MISCELLANEOUS) ×2
PACK LAMINECTOMY NEURO (CUSTOM PROCEDURE TRAY) ×2 IMPLANT
PLATE CROSSLINK 5.5X39-45MM SP (Plate) ×1 IMPLANT
ROD 5.5MM SPINAL SOLERA (Rod) ×1 IMPLANT
SCREW SET SOLERA (Screw) ×16 IMPLANT
SCREW SET SOLERA TI5.5 (Screw) IMPLANT
SCREW SOLERA 45X5.5XMA NS SPNE (Screw) IMPLANT
SCREW SOLERA 45X6.5XMA NS SPNE (Screw) IMPLANT
SCREW SOLERA 5.5X45MM (Screw) ×8 IMPLANT
SCREW SOLERA 6.5X45MM (Screw) ×8 IMPLANT
SPONGE SURGIFOAM ABS GEL 100 (HEMOSTASIS) ×2 IMPLANT
STRIP CLOSURE SKIN 1/2X4 (GAUZE/BANDAGES/DRESSINGS) ×4 IMPLANT
SUT NURALON 4 0 TF (SUTURE) ×1 IMPLANT
SUT VIC AB 0 CT1 18XCR BRD8 (SUTURE) ×2 IMPLANT
SUT VIC AB 0 CT1 8-18 (SUTURE) ×4
SUT VIC AB 2-0 CT1 18 (SUTURE) ×4 IMPLANT
SUT VIC AB 3-0 SH 8-18 (SUTURE) ×4 IMPLANT
SUT VICRYL RAPIDE 3/0 (SUTURE) ×1 IMPLANT
TOWEL GREEN STERILE (TOWEL DISPOSABLE) ×2 IMPLANT
TOWEL GREEN STERILE FF (TOWEL DISPOSABLE) ×2 IMPLANT
TRAY FOLEY MTR SLVR 16FR STAT (SET/KITS/TRAYS/PACK) ×2 IMPLANT
WATER STERILE IRR 1000ML POUR (IV SOLUTION) ×2 IMPLANT
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
## 2020-10-30 NOTE — ED Notes (Addendum)
Pt still attempting to move around in bed, patient educated again on importance of lying flat and remaining still. MD Vicente Males made aware.
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
## 2020-10-30 NOTE — ED Notes (Signed)
Per request of pt, this RN at bedside called wife. Wife advised she is on the way, pt made aware.
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
## 2020-10-30 NOTE — Anesthesia Procedure Notes (Signed)
Procedure Name: Intubation Date/Time: 10/30/2020 8:54 PM Performed by: Zollie Scale, CRNA Pre-anesthesia Checklist: Patient identified, Emergency Drugs available, Suction available and Patient being monitored Patient Re-evaluated:Patient Re-evaluated prior to induction Oxygen Delivery Method: Circle System Utilized Preoxygenation: Pre-oxygenation with 100% oxygen Induction Type: IV induction, Rapid sequence and Cricoid Pressure applied Laryngoscope Size: 3 and Glidescope Grade View: Grade I Tube type: Oral Tube size: 7.5 mm Number of attempts: 1 Airway Equipment and Method: Stylet and Video-laryngoscopy Placement Confirmation: ETT inserted through vocal cords under direct vision,  positive ETCO2 and breath sounds checked- equal and bilateral Secured at: 23 cm Tube secured with: Tape Dental Injury: Teeth and Oropharynx as per pre-operative assessment  Comments: Glidescope utilized d/t neurology team not yet having cleared C-spine; post intubation, Pool MD at bedside and stated C-spine is clear and [place] J can be removed. Hollis MD present for induction and aware of C-spine clearing from [facility] MD.

## 2020-10-30 NOTE — ED Notes (Signed)
Patient transported to CT 

## 2020-10-30 NOTE — ED Notes (Signed)
Pt continuing to bed and waist and attempt to roll over in bed, pt educated again on importance of remaining flat on back.

## 2020-10-30 NOTE — ED Notes (Signed)
Pt rolling over in bed, patient educated on importance of laying flat on back and remaining as still as possible due to possible spine injury. Pt states "I can't it hurts too bad." Pt returned to lying flat on back.

## 2020-10-30 NOTE — ED Notes (Signed)
Pt transported to CT accompanied by this RN on cardiac monitor.
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
## 2020-10-30 NOTE — ED Notes (Addendum)
Report given to [facility] provider. Advising 15 minute ETA.
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
## 2020-10-30 NOTE — ED Notes (Signed)
Bladder scan 

## 2020-10-31 ENCOUNTER — Inpatient Hospital Stay (HOSPITAL_COMMUNITY): Payer: BC Managed Care – PPO

## 2020-10-31 DIAGNOSIS — G834 Cauda equina syndrome: Secondary | ICD-10-CM

## 2020-10-31 LAB — HIV ANTIBODY (ROUTINE TESTING W REFLEX): HIV Screen 4th Generation wRfx: NONREACTIVE

## 2020-10-31 MED ORDER — AMPHETAMINE-DEXTROAMPHETAMINE 10 MG PO TABS: 20.0000 mg | ORAL_TABLET | Freq: Two times a day (BID) | ORAL | Status: DC | PRN

## 2020-10-31 MED ORDER — PHENOL 1.4 % MT LIQD: 1.0000 | OROMUCOSAL | Status: DC | PRN

## 2020-10-31 MED ORDER — CEFAZOLIN SODIUM-DEXTROSE 1-4 GM/50ML-% IV SOLN
1.0000 g | Freq: Three times a day (TID) | INTRAVENOUS | Status: AC
  Administered 2020-10-31 (×2): 1 g via INTRAVENOUS
  Filled 2020-10-31 (×2): qty 50

## 2020-10-31 MED ORDER — ACETAMINOPHEN 325 MG PO TABS
650.0000 mg | ORAL_TABLET | Freq: Four times a day (QID) | ORAL | Status: DC | PRN
  Administered 2020-10-31 – 2020-11-03 (×5): 650 mg via ORAL
  Filled 2020-10-31 (×5): qty 2

## 2020-10-31 MED ORDER — DIAZEPAM 5 MG PO TABS
5.0000 mg | ORAL_TABLET | Freq: Four times a day (QID) | ORAL | Status: DC | PRN
  Administered 2020-11-03: 5 mg via ORAL
  Administered 2020-11-03: 10 mg via ORAL
  Administered 2020-11-06 – 2020-11-07 (×3): 5 mg via ORAL
  Filled 2020-10-31 (×3): qty 1
  Filled 2020-10-31: qty 2
  Filled 2020-10-31: qty 1

## 2020-10-31 MED ORDER — DOCUSATE SODIUM 100 MG PO CAPS
100.0000 mg | ORAL_CAPSULE | Freq: Every day | ORAL | Status: DC
  Administered 2020-10-31 – 2020-11-01 (×2): 100 mg via ORAL
  Filled 2020-10-31 (×2): qty 1

## 2020-10-31 MED ORDER — ONDANSETRON HCL 4 MG/2ML IJ SOLN: 4.0000 mg | Freq: Four times a day (QID) | INTRAMUSCULAR | Status: DC | PRN

## 2020-10-31 MED ORDER — OXYCODONE HCL 5 MG PO TABS
10.0000 mg | ORAL_TABLET | ORAL | Status: DC | PRN
Start: 2020-10-31 — End: 2020-11-03
  Administered 2020-10-31 – 2020-11-02 (×7): 10 mg via ORAL
  Filled 2020-10-31 (×8): qty 2

## 2020-10-31 MED ORDER — ACETAMINOPHEN 325 MG PO TABS: 650.0000 mg | ORAL_TABLET | ORAL | Status: DC | PRN

## 2020-10-31 MED ORDER — ADULT MULTIVITAMIN W/MINERALS CH
1.0000 | ORAL_TABLET | Freq: Every day | ORAL | Status: DC
  Administered 2020-10-31 – 2020-11-08 (×9): 1 via ORAL
  Filled 2020-10-31 (×9): qty 1

## 2020-10-31 MED ORDER — HYDROMORPHONE HCL 1 MG/ML IJ SOLN
1.0000 mg | INTRAMUSCULAR | Status: DC | PRN
  Administered 2020-10-31 – 2020-11-03 (×32): 1 mg via INTRAVENOUS
  Filled 2020-10-31 (×32): qty 1

## 2020-10-31 MED ORDER — SUGAMMADEX SODIUM 200 MG/2ML IV SOLN
INTRAVENOUS | Status: DC | PRN
  Administered 2020-10-31: 200 mg via INTRAVENOUS

## 2020-10-31 MED ORDER — SODIUM CHLORIDE 0.9% FLUSH
3.0000 mL | Freq: Two times a day (BID) | INTRAVENOUS | Status: DC
  Administered 2020-10-31 – 2020-11-08 (×18): 3 mL via INTRAVENOUS

## 2020-10-31 MED ORDER — FLEET ENEMA 7-19 GM/118ML RE ENEM: 1.0000 | ENEMA | Freq: Once | RECTAL | Status: DC | PRN

## 2020-10-31 MED ORDER — ONDANSETRON HCL 4 MG PO TABS: 4.0000 mg | ORAL_TABLET | Freq: Four times a day (QID) | ORAL | Status: DC | PRN

## 2020-10-31 MED ORDER — ACETAMINOPHEN 650 MG RE SUPP: 650.0000 mg | Freq: Four times a day (QID) | RECTAL | Status: DC | PRN

## 2020-10-31 MED ORDER — CHLORHEXIDINE GLUCONATE CLOTH 2 % EX PADS
6.0000 | MEDICATED_PAD | Freq: Every day | CUTANEOUS | Status: DC
  Administered 2020-10-31 – 2020-11-08 (×8): 6 via TOPICAL

## 2020-10-31 MED ORDER — MENTHOL 3 MG MT LOZG: 1.0000 | LOZENGE | OROMUCOSAL | Status: DC | PRN

## 2020-10-31 MED ORDER — POLYETHYLENE GLYCOL 3350 17 G PO PACK: 17.0000 g | PACK | Freq: Every day | ORAL | Status: DC | PRN

## 2020-10-31 MED ORDER — OXYCODONE HCL 5 MG PO TABS: 5.0000 mg | ORAL_TABLET | ORAL | Status: DC | PRN

## 2020-10-31 MED ORDER — HYDROCODONE-ACETAMINOPHEN 10-325 MG PO TABS
1.0000 | ORAL_TABLET | ORAL | Status: DC | PRN
  Administered 2020-10-31 – 2020-11-03 (×4): 1 via ORAL
  Filled 2020-10-31 (×5): qty 1

## 2020-10-31 MED ORDER — HYDROCORTISONE (PERIANAL) 2.5 % EX CREA
1.0000 "application " | TOPICAL_CREAM | CUTANEOUS | Status: DC | PRN
  Filled 2020-10-31: qty 28.35

## 2020-10-31 MED ORDER — BUPROPION HCL ER (SR) 100 MG PO TB12
100.0000 mg | ORAL_TABLET | Freq: Two times a day (BID) | ORAL | Status: DC
  Administered 2020-10-31 – 2020-11-03 (×7): 100 mg via ORAL
  Filled 2020-10-31 (×8): qty 1

## 2020-10-31 MED ORDER — ACETAMINOPHEN 650 MG RE SUPP: 650.0000 mg | RECTAL | Status: DC | PRN

## 2020-10-31 MED ORDER — BISACODYL 10 MG RE SUPP: 10.0000 mg | Freq: Every day | RECTAL | Status: DC | PRN

## 2020-10-31 MED ORDER — SODIUM CHLORIDE 0.9% FLUSH: 3.0000 mL | INTRAVENOUS | Status: DC | PRN

## 2020-10-31 MED ORDER — SODIUM CHLORIDE 0.9 % IV SOLN: 250.0000 mL | INTRAVENOUS | Status: DC

## 2020-10-31 MED FILL — Heparin Sodium (Porcine) Inj 1000 Unit/ML: INTRAMUSCULAR | Qty: 30 | Status: AC

## 2020-10-31 MED FILL — Sodium Chloride IV Soln 0.9%: INTRAVENOUS | Qty: 1000 | Status: AC
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
## 2020-10-31 NOTE — Plan of Care (Signed)
  Problem: Clinical Measurements: Goal: Diagnostic test results will improve Outcome: Progressing   Problem: Nutrition: Goal: Adequate nutrition will be maintained Outcome: Progressing   Problem: Coping: Goal: Level of anxiety will decrease Outcome: Progressing   Problem: Bladder/Genitourinary: Goal: Urinary functional status for postoperative course will improve Outcome: Progressing   Problem: Health Behavior/Discharge Planning: Goal: Identification of resources available to assist in meeting health care needs will improve Outcome: Progressing   Problem: Skin Integrity: Goal: Will show signs of wound healing Outcome: Progressing   Problem: Pain Management: Goal: Pain level will decrease Outcome: Progressing   Problem: Clinical Measurements: Goal: Postoperative complications will be avoided or minimized Outcome: Progressing   Problem: Safety: Goal: Ability to remain free from injury will improve Outcome: Progressing   Problem: Coping: Goal: Level of anxiety will decrease Outcome: Progressing

## 2020-10-31 NOTE — Progress Notes (Signed)
Inpatient Rehab Admissions Coordinator Note:   Per therapy recommendations, pt was screened for CIR candidacy by Estill Dooms, PT, DPT.  At this time we are recommending a CIR consult and I will place an order per our protocol.  Please contact me with questions.   Estill Dooms, PT, DPT [phone] 10/31/20 1:02 PM
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
## 2020-10-31 NOTE — Progress Notes (Signed)
Postop day 1.  Patient complains of incisional pain patient also complains of right ankle pain.  Denies any radiating pain or numbness.  Catheter recently removed.  Patient reports good perineal sensation but has not voided yet.  Afebrile.  Vital signs are stable.  Urine output good overnight.  Awake and alert.  Oriented and reasonably appropriate.  Motor examination somewhat limited by pain but patient with good strength in both lower extremities at least 4+/5 in all major motor groups.  Patient with some hyperesthetic pain in his right distal leg and ankle.  This is around where he has chronic pain with a prior right ankle fracture status post repair.  Dressing clean and dry.  Status post thoracic lumbar fusion for treatment of severe L1 burst fracture.  Begin to mobilize today.  Continue present pain control.  Check ankle x-rays.
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
## 2020-10-31 NOTE — Plan of Care (Signed)
  Problem: Education: Goal: Knowledge of General Education information will improve Description: Including pain rating scale, medication(s)/side effects and non-pharmacologic comfort measures Outcome: Progressing   Problem: Pain Managment: Goal: General experience of comfort will improve Outcome: Progressing   Problem: Safety: Goal: Ability to remain free from injury will improve Outcome: Progressing   Problem: Skin Integrity: Goal: Risk for impaired skin integrity will decrease Outcome: Progressing   Problem: Education: Goal: Ability to verbalize activity precautions or restrictions will improve Outcome: Progressing   Problem: Bowel/Gastric: Goal: Gastrointestinal status for postoperative course will improve Outcome: Progressing   Problem: Bladder/Genitourinary: Goal: Urinary functional status for postoperative course will improve Outcome: Progressing

## 2020-10-31 NOTE — Transfer of Care (Signed)
Immediate Anesthesia Transfer of Care Note  Patient: Kevin Whitehead.  Procedure(s) Performed: POSTERIOR LUMBAR FUSION 4 LEVEL (N/A Back)  Patient Location: PACU  Anesthesia Type:General  Level of Consciousness: awake, alert  and oriented  Airway & Oxygen Therapy: Patient Spontanous Breathing and Patient connected to face mask oxygen  Post-op Assessment: Report given to RN and Post -op Vital signs reviewed and stable  Post vital signs: Reviewed and stable  Last Vitals:  Vitals Value Taken Time  BP 116/82 10/31/20 0022  Temp    Pulse 80 10/31/20 0030  Resp 17 10/31/20 0030  SpO2 100 % 10/31/20 0030  Vitals shown include unvalidated device data.  Last Pain:  Vitals:   10/30/20 1930  TempSrc:   PainSc: 10-Worst pain ever         Complications: No complications documented.
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
## 2020-10-31 NOTE — Progress Notes (Signed)
Orthopedic Tech Progress Note Patient Details:  Kevin Whitehead Dec 09, 1980 [id number]  Patient ID: Kevin Reining., male   DOB: August 20, 1981, 39 y.o.   MRN: [id number] Called order into [facility]  Trinna Post 10/31/2020, 7:01 AM
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
## 2020-10-31 NOTE — Progress Notes (Signed)
Pt. has not voided since foley catheter removed 8 hours ago. Bladder scan completed with results of 822 mL. I&O cath with 950 mL of urine emptied.

## 2020-11-01 ENCOUNTER — Encounter (HOSPITAL_COMMUNITY): Payer: Self-pay | Admitting: Neurosurgery

## 2020-11-01 LAB — TYPE AND SCREEN
ABO/RH(D): A NEG
Antibody Screen: NEGATIVE

## 2020-11-01 MED ORDER — POLYETHYLENE GLYCOL 3350 17 G PO PACK
17.0000 g | PACK | Freq: Every day | ORAL | Status: DC
  Administered 2020-11-01 – 2020-11-08 (×8): 17 g via ORAL
  Filled 2020-11-01 (×8): qty 1

## 2020-11-01 MED ORDER — DOCUSATE SODIUM 100 MG PO CAPS
100.0000 mg | ORAL_CAPSULE | Freq: Two times a day (BID) | ORAL | Status: DC
  Administered 2020-11-01 – 2020-11-08 (×14): 100 mg via ORAL
  Filled 2020-11-01 (×14): qty 1

## 2020-11-01 MED ORDER — BETHANECHOL CHLORIDE 10 MG PO TABS
10.0000 mg | ORAL_TABLET | Freq: Three times a day (TID) | ORAL | Status: DC
  Administered 2020-11-01 – 2020-11-08 (×22): 10 mg via ORAL
  Filled 2020-11-01 (×22): qty 1

## 2020-11-01 NOTE — Anesthesia Postprocedure Evaluation (Signed)
Anesthesia Post Note  Patient: Kevin Whitehead.  Procedure(s) Performed: POSTERIOR LUMBAR FUSION 4 LEVEL (N/A Back)     Patient location during evaluation: PACU Anesthesia Type: General Level of consciousness: awake and alert Pain management: pain level controlled Vital Signs Assessment: post-procedure vital signs reviewed and stable Respiratory status: spontaneous breathing, nonlabored ventilation, respiratory function stable and patient connected to nasal cannula oxygen Cardiovascular status: blood pressure returned to baseline and stable Postop Assessment: no apparent nausea or vomiting Anesthetic complications: no   No complications documented.       Shelton Silvas
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
## 2020-11-01 NOTE — Progress Notes (Signed)
Postop day 2.  Patient continues to complain of severe back pain.  No radicular pain.  Still having some numbness and paresthesias along the right lateral and plantar surface of his right foot but no more neuropathic pain.  Patient with inability to void but he does have feelings of urinary bladder fullness and urge to void.  He did feel the catheter being placed.  He is afebrile.  His vital signs are stable.  Urine output is good.  He is awake and alert.  Motor examination appears grossly intact bilaterally although it still somewhat limited by pain.  Sensory examination with some decrease sensation in his right S1 dermatome.  Wound clean and dry.  Abdomen soft.  Overall progressing well.  Continue efforts at mobilization.  Patient will likely require inpatient rehabilitation before discharge home.
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
## 2020-11-01 NOTE — Progress Notes (Signed)
Pt has tried to void twice on this shift. Bladder Scan completed at midnight of over . Straight Cath of .   Another Bladder scan at 0400 of . Straight cath of .  Renard Hamper, RN 11/01/20
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
## 2020-11-01 NOTE — Progress Notes (Signed)
Inpatient Rehabilitation-Admissions Coordinator   Met with pt bedside as follow up from PM&R consult. Please see consult note from Dr. Dagoberto Ligas on 12/14 for details. Discussed recommended rehab program and expectations. The patient would like to pursue once insurance approves. Discussed that pt will need to be able to tolerate intensive rehab off IV pain medications. Noted pt is still receiving IV pain meds approx every 2 hours at this point. Pt is aware of this requirement.   Will continue to follow for tolerance and readiness for CIR.   Raechel Ache, OTR/L  Rehab Admissions Coordinator  [phone] 11/01/2020 2:40 PM

## 2020-11-02 NOTE — Progress Notes (Signed)
Inpatient Rehabilitation-Admissions Coordinator   Continuing to follow pt for medical readiness. Pt still receiving IV pain meds regularly. Per admitting MD, pt will need to be off IV pain meds >12 hours prior to potential CIR admit. Will continue to follow for readiness and then begin insurance authorization process.   Cheri Rous, OTR/L  Rehab Admissions Coordinator  [phone] 11/02/2020 2:14 PM
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
## 2020-11-02 NOTE — TOC CAGE-AID Note (Signed)
Transition of Care (TOC) - CAGE-AID Screening   Patient Details  Name: Kevin Whitehead. MRN: [id number] Date of Birth: 02-11-1981  Transition of Care [facility]) CM/SW Contact:    Cristobal Goldmann, LCSW Phone Number: [phone] 11/02/2020, 3:56 PM   Clinical Narrative: Talked with patient at the bedside regarding substance abuse screening and he was willing to participate. Mr. Erber was lying in bed and was awake, alert and oriented. His mother was also in the room and patient was willing to talk with CSW with his mother present.  Mr. Stetson reported that he has not consumed any alcohol in at least 5 years and also does not smoke. When asked, patient denied any illicit drug use. CSW asked about showing positive for opiates 3 months ago and amphetamine UR positive 3/9 months and 1 year ago and patient reported that he has had prescription medication in the past that has contained opiates, amphetamine and oxycodone within the specified time frames CSW mentioned.    Patient was calm during visit and did not express any agitation or irritation while answering questions.  Appreciation expressed to patient for his willingness to answer the questions and talk with CSW.   CAGE-AID Screening:    Have You Ever Felt You Ought to Cut Down on Your Drinking or Drug Use?: No Have People Annoyed You By Critizing Your Drinking Or Drug Use?: No Have You Felt Bad Or Guilty About Your Drinking Or Drug Use?: No Have You Ever Had a Drink or Used Drugs First Thing In The Morning to Steady Your Nerves or to Get Rid of a Hangover?: No CAGE-AID Score: 0  Substance Abuse Education Offered: No (Patient reported no alcohol consumption in at least 5 years, and no drug use at all)  Substance abuse interventions: Other (must comment) (None provided)

## 2020-11-03 MED ORDER — BUPROPION HCL ER (SR) 100 MG PO TB12
100.0000 mg | ORAL_TABLET | Freq: Every day | ORAL | Status: DC
  Administered 2020-11-04 – 2020-11-08 (×5): 100 mg via ORAL
  Filled 2020-11-03 (×7): qty 1

## 2020-11-03 MED ORDER — HYDROMORPHONE HCL 2 MG PO TABS
2.0000 mg | ORAL_TABLET | ORAL | Status: DC | PRN
  Administered 2020-11-03: 2 mg via ORAL
  Administered 2020-11-03: 4 mg via ORAL
  Administered 2020-11-03: 2 mg via ORAL
  Administered 2020-11-04 – 2020-11-08 (×25): 4 mg via ORAL
  Filled 2020-11-03: qty 2
  Filled 2020-11-03: qty 1
  Filled 2020-11-03 (×7): qty 2
  Filled 2020-11-03: qty 1
  Filled 2020-11-03 (×2): qty 2
  Filled 2020-11-03: qty 1
  Filled 2020-11-03 (×6): qty 2
  Filled 2020-11-03: qty 1
  Filled 2020-11-03 (×8): qty 2
  Filled 2020-11-03: qty 1
  Filled 2020-11-03: qty 2
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
## 2020-11-04 MED ORDER — KETOROLAC TROMETHAMINE 30 MG/ML IJ SOLN
30.0000 mg | Freq: Four times a day (QID) | INTRAMUSCULAR | Status: DC | PRN
  Administered 2020-11-04 – 2020-11-08 (×16): 30 mg via INTRAVENOUS
  Filled 2020-11-04 (×16): qty 1

## 2020-11-04 NOTE — Progress Notes (Signed)
PT Cancellation Note  Patient Details Name: Kevin Whitehead. MRN: [id number] DOB: 11-20-1980   Cancelled Treatment:    Reason Eval/Treat Not Completed: Patient declined, no reason specified. Pt requesting therapy return at 2:30. He will have another dose of pain meds at that time and does not feel up to PT this am as he had a rough night. Will check back as time allows.   Kallie Locks, PTA Pager [phone] Acute Rehab  Sheral Apley 11/04/2020, 1:11 PM

## 2020-11-04 NOTE — Progress Notes (Signed)
Postop day 4.  Patient complains of a lot of incisional pain.  Still with some right foot and ankle numbness with some dysesthesia.  No symptoms of weakness.  Patient has been ambulating with therapy.  Patient's catheter remains in place.  Patient with significant urinary retention secondary to like only conus medullary is contusion.  He is awake and alert.  He is oriented and appropriate.  He appears uncomfortable.  His temperature is normal.  Heart rate is normal.  Blood pressure good.  Urine output good.  Wound clean and dry.  Motor examination grossly intact bilateral.  Sensory examination with some decrease sensation in his right distal leg and foot.  Good sacral sensation.  We will add Toradol for increased pain control.  Continue efforts at rehabilitation.  Continue bladder rest for now with indwelling Foley.  Patient will hopefully go to rehab soon to progress with therapy.
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
## 2020-11-05 MED ORDER — DEXAMETHASONE SODIUM PHOSPHATE 4 MG/ML IJ SOLN
4.0000 mg | Freq: Once | INTRAMUSCULAR | Status: AC
  Administered 2020-11-05: 4 mg via INTRAVENOUS
  Filled 2020-11-05: qty 1
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
## 2020-11-05 NOTE — Progress Notes (Signed)
Foley dcd at 3 pm today, patient unable to void, having bladder distention and discomfort. Bladder scanned  >600 ml. In and out straight  cath procedure performed> total output 1150 ml. Patient tolerated procedure well.

## 2020-11-06 ENCOUNTER — Encounter (HOSPITAL_COMMUNITY): Payer: Self-pay | Admitting: Neurosurgery
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
## 2020-11-06 NOTE — Progress Notes (Signed)
Inpatient Rehab Admissions Coordinator:   Following for my colleague, Cheri Rous.  Pain appears to be improving.  Will open insurance for prior auth today, and expect determination within 24-48 hours.   Estill Dooms, PT, DPT Admissions Coordinator [phone] 11/06/20  11:39 AM

## 2020-11-07 NOTE — Progress Notes (Signed)
Inpatient Rehab Admissions Coordinator:   I did receive insurance authorization for CIR.  No bed available today, but will continue to follow for admit pending bed availability.   Estill Dooms, PT, DPT Admissions Coordinator [phone] 11/07/20  3:44 PM
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
## 2020-11-07 NOTE — Progress Notes (Signed)
Inpatient Rehab Admissions Coordinator:   Awaiting determination from insurance regarding prior auth for CIR.  Likely won't have a bed available today, but will continue to follow for potential admit pending insurance auth and bed availability.   Estill Dooms, PT, DPT Admissions Coordinator [phone] 11/07/20  11:13 AM
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
## 2020-11-08 ENCOUNTER — Inpatient Hospital Stay (HOSPITAL_COMMUNITY)
Admission: RE | Admit: 2020-11-08 | Discharge: 2020-11-22 | DRG: 560 | Disposition: A | Discharge status: Home Health | Payer: BC Managed Care – PPO | Source: Intra-hospital | Attending: Physical Medicine and Rehabilitation | Admitting: Physical Medicine and Rehabilitation

## 2020-11-08 ENCOUNTER — Other Ambulatory Visit: Payer: Self-pay

## 2020-11-08 ENCOUNTER — Encounter (HOSPITAL_COMMUNITY): Payer: Self-pay | Admitting: Physical Medicine and Rehabilitation

## 2020-11-08 ENCOUNTER — Inpatient Hospital Stay (HOSPITAL_COMMUNITY): Payer: BC Managed Care – PPO

## 2020-11-08 DIAGNOSIS — R748 Abnormal levels of other serum enzymes: Secondary | ICD-10-CM | POA: Diagnosis not present

## 2020-11-08 DIAGNOSIS — K644 Residual hemorrhoidal skin tags: Secondary | ICD-10-CM | POA: Diagnosis present

## 2020-11-08 DIAGNOSIS — R339 Retention of urine, unspecified: Secondary | ICD-10-CM | POA: Diagnosis not present

## 2020-11-08 DIAGNOSIS — N319 Neuromuscular dysfunction of bladder, unspecified: Secondary | ICD-10-CM | POA: Diagnosis not present

## 2020-11-08 DIAGNOSIS — Z87891 Personal history of nicotine dependence: Secondary | ICD-10-CM | POA: Diagnosis not present

## 2020-11-08 DIAGNOSIS — Z981 Arthrodesis status: Secondary | ICD-10-CM

## 2020-11-08 DIAGNOSIS — D62 Acute posthemorrhagic anemia: Secondary | ICD-10-CM | POA: Diagnosis present

## 2020-11-08 DIAGNOSIS — G8918 Other acute postprocedural pain: Secondary | ICD-10-CM

## 2020-11-08 DIAGNOSIS — M7989 Other specified soft tissue disorders: Secondary | ICD-10-CM

## 2020-11-08 DIAGNOSIS — E8809 Other disorders of plasma-protein metabolism, not elsewhere classified: Secondary | ICD-10-CM | POA: Diagnosis not present

## 2020-11-08 DIAGNOSIS — S32011D Stable burst fracture of first lumbar vertebra, subsequent encounter for fracture with routine healing: Secondary | ICD-10-CM | POA: Diagnosis not present

## 2020-11-08 DIAGNOSIS — Z6823 Body mass index (BMI) 23.0-23.9, adult: Secondary | ICD-10-CM | POA: Diagnosis not present

## 2020-11-08 DIAGNOSIS — G8929 Other chronic pain: Secondary | ICD-10-CM | POA: Diagnosis not present

## 2020-11-08 DIAGNOSIS — F909 Attention-deficit hyperactivity disorder, unspecified type: Secondary | ICD-10-CM | POA: Diagnosis present

## 2020-11-08 DIAGNOSIS — E46 Unspecified protein-calorie malnutrition: Secondary | ICD-10-CM | POA: Diagnosis present

## 2020-11-08 DIAGNOSIS — Z4789 Encounter for other orthopedic aftercare: Principal | ICD-10-CM

## 2020-11-08 DIAGNOSIS — Z825 Family history of asthma and other chronic lower respiratory diseases: Secondary | ICD-10-CM

## 2020-11-08 DIAGNOSIS — R7401 Elevation of levels of liver transaminase levels: Secondary | ICD-10-CM | POA: Diagnosis not present

## 2020-11-08 DIAGNOSIS — M792 Neuralgia and neuritis, unspecified: Secondary | ICD-10-CM | POA: Diagnosis not present

## 2020-11-08 DIAGNOSIS — R41840 Attention and concentration deficit: Secondary | ICD-10-CM | POA: Diagnosis not present

## 2020-11-08 DIAGNOSIS — M62838 Other muscle spasm: Secondary | ICD-10-CM | POA: Diagnosis present

## 2020-11-08 DIAGNOSIS — G834 Cauda equina syndrome: Secondary | ICD-10-CM | POA: Diagnosis not present

## 2020-11-08 DIAGNOSIS — K592 Neurogenic bowel, not elsewhere classified: Secondary | ICD-10-CM

## 2020-11-08 MED ORDER — BUPROPION HCL ER (SR) 100 MG PO TB12
100.0000 mg | ORAL_TABLET | Freq: Every day | ORAL | Status: DC
  Administered 2020-11-09: 100 mg via ORAL
  Filled 2020-11-08: qty 1

## 2020-11-08 MED ORDER — TRAMADOL HCL 50 MG PO TABS
50.0000 mg | ORAL_TABLET | Freq: Four times a day (QID) | ORAL | Status: DC | PRN
  Administered 2020-11-09 – 2020-11-22 (×2): 100 mg via ORAL
  Filled 2020-11-08 (×3): qty 2

## 2020-11-08 MED ORDER — DIAZEPAM 5 MG PO TABS: 5.0000 mg | ORAL_TABLET | Freq: Four times a day (QID) | ORAL | 0 refills | Status: DC | PRN

## 2020-11-08 MED ORDER — METHOCARBAMOL 500 MG PO TABS
500.0000 mg | ORAL_TABLET | Freq: Three times a day (TID) | ORAL | Status: DC
  Administered 2020-11-08 – 2020-11-10 (×5): 500 mg via ORAL
  Filled 2020-11-08 (×5): qty 1

## 2020-11-08 MED ORDER — ONDANSETRON HCL 4 MG/2ML IJ SOLN: 4.0000 mg | Freq: Four times a day (QID) | INTRAMUSCULAR | Status: DC | PRN

## 2020-11-08 MED ORDER — NABUMETONE 500 MG PO TABS
500.0000 mg | ORAL_TABLET | Freq: Two times a day (BID) | ORAL | Status: DC
  Administered 2020-11-08 – 2020-11-22 (×28): 500 mg via ORAL
  Filled 2020-11-08 (×30): qty 1

## 2020-11-08 MED ORDER — ONDANSETRON HCL 4 MG PO TABS: 4.0000 mg | ORAL_TABLET | Freq: Four times a day (QID) | ORAL | Status: DC | PRN

## 2020-11-08 MED ORDER — TAMSULOSIN HCL 0.4 MG PO CAPS
0.4000 mg | ORAL_CAPSULE | Freq: Every day | ORAL | Status: DC
  Administered 2020-11-08: 0.4 mg via ORAL
  Filled 2020-11-08: qty 1

## 2020-11-08 MED ORDER — DOCUSATE SODIUM 100 MG PO CAPS
100.0000 mg | ORAL_CAPSULE | Freq: Two times a day (BID) | ORAL | Status: DC
  Administered 2020-11-08 – 2020-11-10 (×4): 100 mg via ORAL
  Filled 2020-11-08 (×4): qty 1

## 2020-11-08 MED ORDER — BETHANECHOL CHLORIDE 10 MG PO TABS
10.0000 mg | ORAL_TABLET | Freq: Three times a day (TID) | ORAL | Status: DC
  Administered 2020-11-08 – 2020-11-18 (×30): 10 mg via ORAL
  Filled 2020-11-08 (×31): qty 1

## 2020-11-08 MED ORDER — HYDROMORPHONE HCL 2 MG PO TABS
2.0000 mg | ORAL_TABLET | ORAL | Status: DC | PRN
  Administered 2020-11-08 – 2020-11-15 (×31): 4 mg via ORAL
  Administered 2020-11-15 (×3): 2 mg via ORAL
  Administered 2020-11-15 – 2020-11-16 (×2): 4 mg via ORAL
  Administered 2020-11-16 – 2020-11-17 (×3): 2 mg via ORAL
  Filled 2020-11-08 (×3): qty 2
  Filled 2020-11-08: qty 1
  Filled 2020-11-08 (×2): qty 2
  Filled 2020-11-08: qty 1
  Filled 2020-11-08 (×5): qty 2
  Filled 2020-11-08: qty 1
  Filled 2020-11-08 (×23): qty 2
  Filled 2020-11-08: qty 1
  Filled 2020-11-08 (×3): qty 2
  Filled 2020-11-08: qty 1

## 2020-11-08 MED ORDER — MELOXICAM 15 MG PO TABS: 15.0000 mg | ORAL_TABLET | Freq: Every day | ORAL | 2 refills | Status: DC

## 2020-11-08 MED ORDER — ACETAMINOPHEN 325 MG PO TABS
650.0000 mg | ORAL_TABLET | Freq: Four times a day (QID) | ORAL | Status: DC | PRN
  Administered 2020-11-08 – 2020-11-14 (×3): 650 mg via ORAL
  Filled 2020-11-08 (×3): qty 2

## 2020-11-08 MED ORDER — AMPHETAMINE-DEXTROAMPHETAMINE 10 MG PO TABS: 20.0000 mg | ORAL_TABLET | Freq: Two times a day (BID) | ORAL | Status: DC | PRN

## 2020-11-08 MED ORDER — ACETAMINOPHEN 650 MG RE SUPP: 650.0000 mg | Freq: Four times a day (QID) | RECTAL | Status: DC | PRN

## 2020-11-08 MED ORDER — POLYETHYLENE GLYCOL 3350 17 G PO PACK
17.0000 g | PACK | Freq: Every day | ORAL | Status: DC
  Administered 2020-11-09 – 2020-11-22 (×13): 17 g via ORAL
  Filled 2020-11-08 (×14): qty 1

## 2020-11-08 MED ORDER — ADULT MULTIVITAMIN W/MINERALS CH
1.0000 | ORAL_TABLET | Freq: Every day | ORAL | Status: DC
  Administered 2020-11-09 – 2020-11-22 (×14): 1 via ORAL
  Filled 2020-11-08 (×14): qty 1

## 2020-11-08 MED ORDER — HYDROCORTISONE (PERIANAL) 2.5 % EX CREA
1.0000 "application " | TOPICAL_CREAM | CUTANEOUS | Status: DC | PRN
  Filled 2020-11-08: qty 28.35

## 2020-11-08 MED ORDER — BISACODYL 10 MG RE SUPP
10.0000 mg | Freq: Every day | RECTAL | Status: DC
  Administered 2020-11-09 – 2020-11-21 (×12): 10 mg via RECTAL
  Filled 2020-11-08 (×13): qty 1

## 2020-11-08 MED ORDER — DIAZEPAM 5 MG PO TABS
5.0000 mg | ORAL_TABLET | Freq: Four times a day (QID) | ORAL | Status: DC | PRN
  Administered 2020-11-08: 10 mg via ORAL
  Administered 2020-11-09 – 2020-11-12 (×4): 5 mg via ORAL
  Administered 2020-11-14 (×2): 10 mg via ORAL
  Administered 2020-11-15 – 2020-11-16 (×2): 5 mg via ORAL
  Administered 2020-11-18 – 2020-11-21 (×4): 10 mg via ORAL
  Filled 2020-11-08: qty 2
  Filled 2020-11-08 (×4): qty 1
  Filled 2020-11-08: qty 2
  Filled 2020-11-08: qty 1
  Filled 2020-11-08 (×2): qty 2
  Filled 2020-11-08: qty 1
  Filled 2020-11-08 (×3): qty 2

## 2020-11-08 MED ORDER — HYDROMORPHONE HCL 2 MG PO TABS: 2.0000 mg | ORAL_TABLET | ORAL | 0 refills | Status: DC | PRN
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
## 2020-11-08 NOTE — Progress Notes (Signed)
Bilateral lower extremity venous study completed.      Please see CV Proc for preliminary results.   Angelique Chevalier, RVT  
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
## 2020-11-08 NOTE — Progress Notes (Signed)
Inpatient Rehabilitation Medication Review by a Pharmacist  A complete drug regimen review was completed for this patient to identify any potential clinically significant medication issues.  Clinically significant medication issues were identified:  yes   Type of Medication Issue Identified Description of Issue Urgent (address now) Non-Urgent (address on AM team rounds) Plan Plan Accepted by Provider? (Yes / No / Pending AM Rounds)  Drug Interaction(s) (clinically significant)       Duplicate Therapy       Allergy       No Medication Administration End Date       Incorrect Dose  Bupropion 100 mg 1 tablet daily ordered, continued from inpatient. Discharge summary instructions to resume pta dosing of 1 tab twice daily  Docusate 100 mg 1 tablet twice daily ordered, continued from inpatient. Discharge summary instructions to resume pta dosing of 1 tab daily. Plans to increase/continue bowel regimen per rehab note, appropriate to continue. Non-urgent (patient has been on once daily since 10/31/20)   NA Follow up with PA in AM     NA  Pending AM rounds      NA  Additional Drug Therapy Needed  Discharge summary instructs to resume pta meloxicam but instead of 7.5 mg as needed, instructed to take 15 mg daily scheduled. Was not on while inpatient. Not currently ordered. Non-urgent Follow up with PA in AM Pending AM rounds  Other          For non-urgent medication issues to be resolved on team rounds tomorrow morning a [facility] Secure Chat Handoff was sent to: Pending AM   Pharmacist comments:   Time spent performing this drug regimen review (minutes):  10  Laverna Peace, PharmD PGY-1 Pharmacy Resident 11/08/2020 5:36 PM Please see AMION for all pharmacy numbers

## 2020-11-08 NOTE — Progress Notes (Signed)
Patient admitted to [id number]. Alert and oriented x4. In good spirits able to make needs known. In bed with mother at bedside. C/o pain to lower back 7/10. PRN tylenol given and patient repositioned until next dose of prn dilaudid can be given. Patient education on the use of call bell and when to call for assistance. Bed low call bell within reach.

## 2020-11-08 NOTE — Progress Notes (Signed)
Inpatient Rehab Admissions Coordinator:    I have insurance approval and a bed available for pt to admit to CIR today. Meghan (NP with neurosurgery) in agreement.  Will let pt/family and TOC team know.   Estill Dooms, PT, DPT Admissions Coordinator [phone] 11/08/20  10:12 AM

## 2020-11-08 NOTE — TOC Transition Note (Signed)
Transition of Care [facility]) - CM/SW Discharge Note   Patient Details  Name: Kevin Whitehead. MRN: [id number] Date of Birth: 1981-05-14  Transition of Care [facility] CM/SW Contact:  Kermit Balo, RN Phone Number: 11/08/2020, 10:30 AM   Clinical Narrative:    Pt discharging to CIR today. CM signing off.    Final next level of care: IP Rehab Facility Barriers to Discharge: No Barriers Identified   Patient Goals and CMS Choice        Discharge Placement                       Discharge Plan and Services                                     Social Determinants of Health (SDOH) Interventions     Readmission Risk Interventions No flowsheet data found.

## 2020-11-08 NOTE — Discharge Instructions (Signed)
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
## 2020-11-09 ENCOUNTER — Inpatient Hospital Stay (HOSPITAL_COMMUNITY): Payer: BC Managed Care – PPO

## 2020-11-09 ENCOUNTER — Inpatient Hospital Stay (HOSPITAL_COMMUNITY): Payer: BC Managed Care – PPO | Admitting: Occupational Therapy

## 2020-11-09 LAB — CBC WITH DIFFERENTIAL/PLATELET
Abs Immature Granulocytes: 0.06 10*3/uL (ref 0.00–0.07)
Basophils Absolute: 0 10*3/uL (ref 0.0–0.1)
Basophils Relative: 1 %
Eosinophils Absolute: 0.2 10*3/uL (ref 0.0–0.5)
Eosinophils Relative: 3 %
HCT: 30.2 % — ABNORMAL LOW (ref 39.0–52.0)
Hemoglobin: 10.5 g/dL — ABNORMAL LOW (ref 13.0–17.0)
Immature Granulocytes: 1 %
Lymphocytes Relative: 24 %
Lymphs Abs: 1.6 10*3/uL (ref 0.7–4.0)
MCH: 30.8 pg (ref 26.0–34.0)
MCHC: 34.8 g/dL (ref 30.0–36.0)
MCV: 88.6 fL (ref 80.0–100.0)
Monocytes Absolute: 0.7 10*3/uL (ref 0.1–1.0)
Monocytes Relative: 11 %
Neutro Abs: 3.9 10*3/uL (ref 1.7–7.7)
Neutrophils Relative %: 60 %
Platelets: 410 10*3/uL — ABNORMAL HIGH (ref 150–400)
RBC: 3.41 MIL/uL — ABNORMAL LOW (ref 4.22–5.81)
RDW: 11.9 % (ref 11.5–15.5)
WBC: 6.4 10*3/uL (ref 4.0–10.5)
nRBC: 0 % (ref 0.0–0.2)

## 2020-11-09 LAB — COMPREHENSIVE METABOLIC PANEL
ALT: 92 U/L — ABNORMAL HIGH (ref 0–44)
AST: 23 U/L (ref 15–41)
Albumin: 2.7 g/dL — ABNORMAL LOW (ref 3.5–5.0)
Alkaline Phosphatase: 106 U/L (ref 38–126)
Anion gap: 9 (ref 5–15)
BUN: 20 mg/dL (ref 6–20)
CO2: 26 mmol/L (ref 22–32)
Calcium: 8.6 mg/dL — ABNORMAL LOW (ref 8.9–10.3)
Chloride: 101 mmol/L (ref 98–111)
Creatinine, Ser: 0.69 mg/dL (ref 0.61–1.24)
GFR, Estimated: >60 mL/min (ref >60)
Glucose, Bld: 108 mg/dL — ABNORMAL HIGH (ref 70–99)
Potassium: 4.2 mmol/L (ref 3.5–5.1)
Sodium: 136 mmol/L (ref 135–145)
Total Bilirubin: 0.7 mg/dL (ref 0.3–1.2)
Total Protein: 5.9 g/dL — ABNORMAL LOW (ref 6.5–8.1)

## 2020-11-09 MED ORDER — MORPHINE SULFATE ER 15 MG PO TBCR
15.0000 mg | EXTENDED_RELEASE_TABLET | Freq: Two times a day (BID) | ORAL | Status: DC
  Administered 2020-11-09: 15 mg via ORAL

## 2020-11-09 MED ORDER — DULOXETINE HCL 30 MG PO CPEP
30.0000 mg | ORAL_CAPSULE | Freq: Every day | ORAL | Status: DC
  Administered 2020-11-09 – 2020-11-10 (×2): 30 mg via ORAL
  Filled 2020-11-09 (×2): qty 1

## 2020-11-09 MED ORDER — BUPROPION HCL ER (SR) 100 MG PO TB12: 100.0000 mg | ORAL_TABLET | Freq: Two times a day (BID) | ORAL | Status: DC

## 2020-11-09 MED ORDER — MORPHINE SULFATE ER 15 MG PO TBCR
15.0000 mg | EXTENDED_RELEASE_TABLET | Freq: Two times a day (BID) | ORAL | Status: DC
  Administered 2020-11-09 – 2020-11-10 (×3): 15 mg via ORAL
  Filled 2020-11-09 (×3): qty 1

## 2020-11-09 MED ORDER — SORBITOL 70 % SOLN
30.0000 mL | Freq: Once | Status: AC
  Administered 2020-11-09: 30 mL via ORAL
  Filled 2020-11-09: qty 30

## 2020-11-09 MED ORDER — TAMSULOSIN HCL 0.4 MG PO CAPS
0.8000 mg | ORAL_CAPSULE | Freq: Every day | ORAL | Status: DC
  Administered 2020-11-09 – 2020-11-21 (×13): 0.8 mg via ORAL
  Filled 2020-11-09 (×13): qty 2

## 2020-11-09 MED ORDER — BUPROPION HCL ER (SR) 100 MG PO TB12
100.0000 mg | ORAL_TABLET | Freq: Every day | ORAL | Status: DC
  Administered 2020-11-09 – 2020-11-22 (×14): 100 mg via ORAL
  Filled 2020-11-09 (×14): qty 1

## 2020-11-09 MED ORDER — CHLORHEXIDINE GLUCONATE CLOTH 2 % EX PADS
6.0000 | MEDICATED_PAD | Freq: Every day | CUTANEOUS | Status: DC
  Administered 2020-11-09 – 2020-11-18 (×5): 6 via TOPICAL

## 2020-11-09 NOTE — Progress Notes (Signed)
Physical Therapy Session Note  Patient Details  Name: Kevin Whitehead. MRN: [id number] Date of Birth: [birth year]  Today's Date: 11/09/2020 PT Individual Time: 9326-7124 PT Individual Time Calculation (min): 60 min   Short Term Goals: Week 1: STG=LTG due to LOS   Skilled Therapeutic Interventions/Progress Updates:   Received pt supine in bed, pt agreeable to therapy, and denied any pain during start of session but appeared concerned about lack of control of bowel and bladder. Pt requested new set of towels and washcloths and RN and NT made aware. Pt transferred supine<>sitting EOB from flat bed with supervision using logroll technique. Pt aware of BLT precautions and demonstrated good awareness throughout session. Donned LSO sitting EOB with min A. Pt transferred bed<>WC stand<>pivot with RW and CGA with total A to manage catheter. Pt transported to ortho gym in [facility] total A for time management purposes and performed simulated car transfer from truck height with 5in running board with min A and cues for technique as pt holding onto mobile doorframe. Pt transported to dayroom in [facility] total A and ambulated 145ft with RW and CGA. Pt demonstrated step through gait pattern, appropriate cadence, but decreased step length. Pt ambulated additional 75ft with 1 handrail and min A. Pt initially hesitant but ultimately no LOB noted. Pt did report minor pain with transitions from sit<>stand and stand<>sit throughout session. Pt transferred sit<>stand with CGA and performed lateral side stepping at rail without UE support x 68ft with CGA. Pt reported increased pain to 9/10 with mobility but remains extremely motivated to work through the pain. Explained to pt importance of rest and recovery time and not pushing past limits. Pt transported back to room in [facility] total A. Pt requested that wife be cleared to assist with transfers in room, as she has been assisting on other units. Therapist allowed pt and pt's wife to set up  transfer back to bed. Pt's wife demonstrated good safety awareness of WC parts management. Pt transferred sit<>stand with RW and CGA provided by wife and ambulated 33ft with RW and close supervision to bed. Pt/wife required min cues for cathether placement on RW during transfer, however demonstrated understanding after cue from therapist. Doffed LSO sitting EOB with supervision and transferred sit<>supine with min A for LE management provided by pt's wife. Concluded session with pt supine in bed, needs within reach, and no bed alarm on. RN and NT updated of pt's wife, Kevin Whitehead, being cleared to perform transfers. Safety plan updated.   Therapy Documentation Precautions:  Restrictions Weight Bearing Restrictions: No   Therapy/Group: Individual Therapy Martin Majestic PT, DPT   11/09/2020, 7:49 AM
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
## 2020-11-09 NOTE — Plan of Care (Signed)
  Problem: RH Balance Goal: LTG Patient will maintain dynamic standing with ADLs (OT) Description: LTG:  Patient will maintain dynamic standing balance with assist during activities of daily living (OT)  Flowsheets (Taken 11/09/2020 1319) LTG: Pt will maintain dynamic standing balance during ADLs with: Independent with assistive device   Problem: Sit to Stand Goal: LTG:  Patient will perform sit to stand in prep for activites of daily living with assistance level (OT) Description: LTG:  Patient will perform sit to stand in prep for activites of daily living with assistance level (OT) Flowsheets (Taken 11/09/2020 1319) LTG: PT will perform sit to stand in prep for activites of daily living with assistance level: Independent with assistive device   Problem: RH Grooming Goal: LTG Patient will perform grooming w/assist,cues/equip (OT) Description: LTG: Patient will perform grooming with assist, with/without cues using equipment (OT) Flowsheets (Taken 11/09/2020 1319) LTG: Pt will perform grooming with assistance level of: Independent with assistive device    Problem: RH Bathing Goal: LTG Patient will bathe all body parts with assist levels (OT) Description: LTG: Patient will bathe all body parts with assist levels (OT) Flowsheets (Taken 11/09/2020 1319) LTG: Pt will perform bathing with assistance level/cueing: Supervision/Verbal cueing   Problem: RH Dressing Goal: LTG Patient will perform upper body dressing (OT) Description: LTG Patient will perform upper body dressing with assist, with/without cues (OT). Flowsheets (Taken 11/09/2020 1319) LTG: Pt will perform upper body dressing with assistance level of: Set up assist Goal: LTG Patient will perform lower body dressing w/assist (OT) Description: LTG: Patient will perform lower body dressing with assist, with/without cues in positioning using equipment (OT) Flowsheets (Taken 11/09/2020 1319) LTG: Pt will perform lower body dressing with  assistance level of: Supervision/Verbal cueing   Problem: RH Toileting Goal: LTG Patient will perform toileting task (3/3 steps) with assistance level (OT) Description: LTG: Patient will perform toileting task (3/3 steps) with assistance level (OT)  Flowsheets (Taken 11/09/2020 1319) LTG: Pt will perform toileting task (3/3 steps) with assistance level: Supervision/Verbal cueing   Problem: RH Toilet Transfers Goal: LTG Patient will perform toilet transfers w/assist (OT) Description: LTG: Patient will perform toilet transfers with assist, with/without cues using equipment (OT) Flowsheets (Taken 11/09/2020 1319) LTG: Pt will perform toilet transfers with assistance level of: Supervision/Verbal cueing   Problem: RH Tub/Shower Transfers Goal: LTG Patient will perform tub/shower transfers w/assist (OT) Description: LTG: Patient will perform tub/shower transfers with assist, with/without cues using equipment (OT) Flowsheets (Taken 11/09/2020 1319) LTG: Pt will perform tub/shower stall transfers with assistance level of: Supervision/Verbal cueing
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
## 2020-11-09 NOTE — Progress Notes (Signed)
Inpatient Rehabilitation  Patient information reviewed and entered into eRehab system by Cortlandt Capuano M. Starlett Pehrson, M.A., CCC/SLP, PPS Coordinator.  Information including medical coding, functional ability and quality indicators will be reviewed and updated through discharge.    
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
## 2020-11-09 NOTE — Progress Notes (Signed)
Inpatient Rehabilitation Center Individual Statement of Services  Patient Name:  Kevin Whitehead.  Date:  11/09/2020  Welcome to the Inpatient Rehabilitation Center.  Our goal is to provide you with an individualized program based on your diagnosis and situation, designed to meet your specific needs.  With this comprehensive rehabilitation program, you will be expected to participate in at least 3 hours of rehabilitation therapies Monday-Friday, with modified therapy programming on the weekends.  Your rehabilitation program will include the following services:  Physical Therapy (PT), Occupational Therapy (OT), 24 hour per day rehabilitation nursing, Therapeutic Recreaction (TR), Neuropsychology, Care Coordinator, Rehabilitation Medicine, Nutrition Services and Pharmacy Services  Weekly team conferences will be held on Tuesday to discuss your progress.  Your Inpatient Rehabilitation Care Coordinator will talk with you frequently to get your input and to update you on team discussions.  Team conferences with you and your family in attendance may also be held.  Expected length of stay: 7-10 days  Overall anticipated outcome: supervision level  Depending on your progress and recovery, your program may change. Your Inpatient Rehabilitation Care Coordinator will coordinate services and will keep you informed of any changes. Your Inpatient Rehabilitation Care Coordinator's name and contact numbers are listed  below.  The following services may also be recommended but are not provided by the Inpatient Rehabilitation Center:   Driving Evaluations  Home Health Rehabiltiation Services  Outpatient Rehabilitation Services  Vocational Rehabilitation   Arrangements will be made to provide these services after discharge if needed.  Arrangements include referral to agencies that provide these services.  Your insurance has been verified to be:  [employer] Your primary doctor is:  Gillie Manners  Pertinent  information will be shared with your doctor and your insurance company.  Inpatient Rehabilitation Care Coordinator:  Dossie Der, [place] [phone] or ([phone]  Information discussed with and copy given to patient by: Lucy Chris, 11/09/2020, 10:12 AM

## 2020-11-10 ENCOUNTER — Inpatient Hospital Stay (HOSPITAL_COMMUNITY): Payer: BC Managed Care – PPO

## 2020-11-10 ENCOUNTER — Inpatient Hospital Stay (HOSPITAL_COMMUNITY): Payer: BC Managed Care – PPO | Admitting: Occupational Therapy

## 2020-11-10 MED ORDER — DOCUSATE SODIUM 100 MG PO CAPS
200.0000 mg | ORAL_CAPSULE | Freq: Every day | ORAL | Status: DC
Start: 2020-11-11 — End: 2020-11-15
  Administered 2020-11-11 – 2020-11-14 (×4): 200 mg via ORAL
  Filled 2020-11-10 (×4): qty 2

## 2020-11-10 MED ORDER — DULOXETINE HCL 30 MG PO CPEP
30.0000 mg | ORAL_CAPSULE | Freq: Every day | ORAL | Status: DC
  Administered 2020-11-11 – 2020-11-14 (×4): 30 mg via ORAL
  Filled 2020-11-10 (×5): qty 1

## 2020-11-10 MED ORDER — OXYCODONE HCL ER 10 MG PO T12A
10.0000 mg | EXTENDED_RELEASE_TABLET | Freq: Two times a day (BID) | ORAL | Status: DC
  Administered 2020-11-10 – 2020-11-14 (×8): 10 mg via ORAL
  Filled 2020-11-10 (×8): qty 1
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
## 2020-11-10 NOTE — IPOC Note (Signed)
Overall Plan of Care [facility]) Patient Details Name: Kevin Whitehead. MRN: [id number] DOB: [birth year]  Admitting Diagnosis: Cauda equina compression [facility])  Hospital Problems: Principal Problem:   Cauda equina compression (HCC)     Functional Problem List: Nursing Bladder,Bowel,Endurance,Medication Management,Pain,Perception,Safety,Skin Integrity,Sensory  Kevin Whitehead Balance,Pain,Skin Integrity,Edema,Endurance,Safety,Motor,Sensory,Nutrition  OT Balance,Endurance,Motor,Pain,Safety  SLP    TR         Basic ADL's: OT Grooming,Bathing,Dressing,Toileting     Advanced  ADL's: OT       Transfers: Kevin Whitehead Bed Mobility,Bed to Chair,Car,Furniture,Floor  OT Toilet,Tub/Shower     Locomotion: Kevin Whitehead Ambulation,Stairs     Additional Impairments: OT None  SLP        TR      Anticipated Outcomes Item Anticipated Outcome  Self Feeding no goal  Swallowing      Basic self-care  Supervision  Toileting  Supervision   Bathroom Transfers Supervision  Bowel/Bladder  Mod I  Transfers  mod I using LRAD  Locomotion  mod I household 50 ft and supervision community >500 ft using LRAD  Communication     Cognition     Pain  <3  Safety/Judgment  Mod I   Therapy Plan: Kevin Whitehead Intensity: Minimum of 1-2 x/day ,45 to 90 minutes Kevin Whitehead Frequency: 5 out of 7 days Kevin Whitehead Duration Estimated Length of Stay: 7-10 days OT Intensity: Minimum of 1-2 x/day, 45 to 90 minutes OT Frequency: 5 out of 7 days OT Duration/Estimated Length of Stay: 7-10 days     Due to the current state of emergency, patients may not be receiving their 3-hours of Medicare-mandated therapy.   Team Interventions: Nursing Interventions Patient/Family Education,Bladder Management,Bowel Management,Pain Management,Medication Management,Skin Care/Wound Management,Discharge Planning,Psychosocial Support  Kevin Whitehead interventions Ambulation/gait training,Discharge planning,DME/adaptive equipment instruction,Functional mobility training,Pain  management,Psychosocial support,Splinting/orthotics,Therapeutic Activities,UE/LE Strength taining/ROM,Balance/vestibular training,Community reintegration,Disease management/prevention,Functional electrical stimulation,Neuromuscular re-education,Patient/family education,Skin care/wound management,Stair training,Therapeutic Exercise,UE/LE Coordination activities  OT Interventions Balance/vestibular training,Discharge planning,Pain management,Self Care/advanced ADL retraining,Therapeutic Activities,UE/LE Coordination activities,Visual/perceptual remediation/compensation,Therapeutic Exercise,Skin care/wound managment,Patient/family education,Functional mobility training,Disease Nutritional therapist re-education,Psychosocial support,UE/LE Strength taining/ROM,Wheelchair propulsion/positioning,Splinting/orthotics  SLP Interventions    TR Interventions    SW/CM Interventions Discharge Planning,Psychosocial Support,Patient/Family Education   Barriers to Discharge MD  Medical stability, Home enviroment access/loayout, Incontinence, Neurogenic bowel and bladder, Wound care, Lack of/limited family support, Weight bearing restrictions, Behavior and poor pain control- working on it  Nursing Decreased caregiver support,Home environment access/layout,Neurogenic Bowel & Bladder,Wound Care,Lack of/limited family support,Weight bearing restrictions,Medication compliance,Behavior    Kevin Whitehead Neurogenic Bowel & Bladder,Home environment access/layout,Incontinence,Other (comments) 3 STE without rails, incontinent of bowl, pain uncontrolled  OT      SLP      SW       Team Discharge Planning: Destination: Kevin Whitehead-Home ,OT- Home , SLP-  Projected Follow-up: Kevin Whitehead-Outpatient Kevin Whitehead, OT-  Home health OT, SLP-  Projected Equipment Needs: Kevin Whitehead-Rolling walker with 5" wheels,To be determined, OT- To be determined, SLP-  Equipment Details: Kevin Whitehead- , OT-  Patient/family  involved in discharge planning: Kevin Whitehead- Patient,Family member/caregiver,  OT-Patient,Family member/caregiver, SLP-   MD ELOS: 10- 12 days due to medical issues Medical Rehab Prognosis:  Good Assessment: Kevin Whitehead is a 39 yr old male with new cauda equina syndrome with neurogenic bowel and bladder and poor pain control-  Kevin Whitehead is especially overwhelmed with medical issues, so working on this.   Goals mod I by d/c- suggest 10-12 days, not 7-10 days due to medical issues including Bowel and bladder    See Team Conference Notes for weekly updates to the plan of care

## 2020-11-10 NOTE — Progress Notes (Signed)
Physical Therapy Session Note  Patient Details  Name: Kevin Whitehead. MRN: [id number] Date of Birth: 09-Aug-1981  Today's Date: 11/10/2020 PT Individual Time: 0730-0830 PT Individual Time Calculation (min): 60 min   Short Term Goals: Week 1:  PT Short Term Goal 1 (Week 1): STG=LTG due to ELOS  Skilled Therapeutic Interventions/Progress Updates:    Patient standing at sink brushing teeth with wife supervision.  Reports pain more since therapy yesterday, but recently got pain medication.  Patient ambulated throughout session with RW and close S to CGA up to 150', 100', 80' and 150' with intermittent cues for posture, hip extension.  Standing balance at BITS to touch targets over screen for reaction time 1 minute then 2 minutes, first round with one UE support, then without UE support and CGA to close S.  Patient seated for Nu Step at level 1 UE/LE x 6 minutes for ROM.  Patient performed standing balance assessment as noted below, scored 29/56 and educated about fall risk and using RW.  Most difficulty with sit<>stand without UE support. Patient ambulated back to room with RW and requesting to toilet with wife in the room.  Ensured wife verified as trained caregiver while in the room.  Patient left in bathroom with wife present and NT aware.  Signed safety plan for wife allowed to assist.   Therapy Documentation Precautions:  Precautions Precautions: Fall,Back Precaution Comments: pt stated 3/3 precautions without cues, minimal cues for adherance during eval Required Braces or Orthoses: Spinal Brace Spinal Brace: Lumbar corset,Applied in sitting position Restrictions Weight Bearing Restrictions: No Pain: Pain Assessment Pain Scale: 0-10 Pain Score: 7  Pain Type: Acute pain Pain Location: Back Pain Orientation: Medial Pain Radiating Towards: left groin Pain Descriptors / Indicators: Aching;Sharp Pain Frequency: Intermittent Pain Onset: On-going Patients Stated Pain Goal: 3 Pain  Intervention(s): Ambulation/increased activity  Balance: Balance Balance Assessed: Yes Standardized Balance Assessment Standardized Balance Assessment: Berg Balance Test Berg Balance Test Sit to Stand: Needs minimal aid to stand or to stabilize Standing Unsupported: Able to stand 2 minutes with supervision Sitting with Back Unsupported but Feet Supported on Floor or Stool: Able to sit safely and securely 2 minutes Stand to Sit: Controls descent by using hands Transfers: Able to transfer with verbal cueing and /or supervision Standing Unsupported with Eyes Closed: Able to stand 10 seconds with supervision Standing Ubsupported with Feet Together: Able to place feet together independently and stand for 1 minute with supervision From Standing, Reach Forward with Outstretched Arm: Can reach forward >5 cm safely (2") From Standing Position, Pick up Object from Floor: Unable to try/needs assist to keep balance From Standing Position, Turn to Look Behind Over each Shoulder: Turn sideways only but maintains balance Turn 360 Degrees: Able to turn 360 degrees safely but slowly Standing Unsupported, Alternately Place Feet on Step/Stool: Able to complete >2 steps/needs minimal assist Standing Unsupported, One Foot in Front: Able to take small step independently and hold 30 seconds Standing on One Leg: Tries to lift leg/unable to hold 3 seconds but remains standing independently Total Score: 29   Therapy/Group: Individual Therapy  [name]  Sheran Lawless, PT 11/10/2020, 9:42 AM

## 2020-11-10 NOTE — Progress Notes (Signed)
Occupational Therapy Session Note  Patient Details  Name: Kevin Whitehead. MRN: [id number] Date of Birth: [birth year]  Today's Date: 11/10/2020 OT Individual Time: 1002-1100 and 6195-0932 OT Individual Time Calculation (min): 58 min and 77 min   Short Term Goals: Week 1:  OT Short Term Goal 1 (Week 1): STGs = LTGs d/t ELOS  Skilled Therapeutic Interventions/Progress Updates:    Pt greeted at time of session supine in bed resting with wife present, pain throughout session 7/10 with pt stating this is the best he has felt. Supine to sit Min A and ambulated bed > bathroom CGA, transferred to shower bench in same manner. Performed shower level bathing with incision site covered and adhering to back precautions with Min A overall, able to reach buttocks with lateral leans and BLEs with almost figure four but therapist washed BLEs past knee level as well for thoroughness, will provide LHS when available. Dried off in same manner, donned shirt set up and brace Min A. Donned brief with assist for straps and pants min A, did not need to use reacher today able to bring feet up to self to thread, assist to thread Foley. Walked shower > w/c in room CGA with RW and alarm on call bell in reach, pt aware to cut alarm off when wife is present as she has been cleared to perform transfers.   Session 2: Pt greeted at time of session sitting up in wheelchair agreeable to going outside for fresh air and uplifting mood, mild pain reported during session no number given and rest breaks PRN. Self propelled partially room <> elevator and outside with supervision/Mod I. Pt noted uplifted mood after outside visit. Focus of self propelling wheelchair for UB strength and cardio endurance. Once back inside, pt performed multiple rounds of dynamic standing at [facility] for 4-6 minute intervals and at times using unilateral support and no UE support for dynamic standing. Rebounder activity sitting and standing with 2kg green ball toss  to improve righting reactions and UB strength as well as provide proprioceptive input, 3x30. Graded as needed including with aeromat. Discussion and pt education throughout session regarding DC planning. Walked short distance in room with CGA with RW to bed, sit to supine supervision. Alarm on, pt aware he can have wife cut off when she arrives as she has been cleared for transfers.   Therapy Documentation Precautions:  Precautions Precautions: Fall,Back Precaution Comments: pt stated 3/3 precautions without cues, minimal cues for adherance during eval Required Braces or Orthoses: Spinal Brace Spinal Brace: Lumbar corset,Applied in sitting position Restrictions Weight Bearing Restrictions: No     Therapy/Group: Individual Therapy  Erasmo Score 11/10/2020, 12:19 PM

## 2020-11-10 NOTE — Plan of Care (Signed)
  Problem: Consults Goal: RH SPINAL CORD INJURY PATIENT EDUCATION Description:  See Patient Education module for education specifics.  Outcome: Progressing Goal: Skin Care Protocol Initiated - if Braden Score 18 or less Description: If consults are not indicated, leave blank or document N/A Outcome: Progressing   Problem: SCI BOWEL ELIMINATION Goal: RH STG MANAGE BOWEL WITH ASSISTANCE Description: STG Manage Bowel with mod I Assistance. Outcome: Progressing Goal: RH STG SCI MANAGE BOWEL WITH MEDICATION WITH ASSISTANCE Description: STG SCI Manage bowel with medication with mod I assistance. Outcome: Progressing Goal: RH STG SCI MANAGE BOWEL PROGRAM W/ASSIST OR AS APPROPRIATE Description: STG SCI Manage bowel program w/ mod I assist or as appropriate. Outcome: Progressing   Problem: SCI BLADDER ELIMINATION Goal: RH STG MANAGE BLADDER WITH ASSISTANCE Description: STG Manage Bladder With mod I Assistance Outcome: Progressing Goal: RH STG MANAGE BLADDER WITH MEDICATION WITH ASSISTANCE Description: STG Manage Bladder With Medication With mod I Assistance. Outcome: Progressing   Problem: RH SKIN INTEGRITY Goal: RH STG SKIN FREE OF INFECTION/BREAKDOWN Description: Skin to remain free of infection and breakdown with mod I assist. Outcome: Progressing Goal: RH STG MAINTAIN SKIN INTEGRITY WITH ASSISTANCE Description: STG Maintain Skin Integrity With mod I Assistance. Outcome: Progressing Goal: RH STG ABLE TO PERFORM INCISION/WOUND CARE W/ASSISTANCE Description: STG Able To Perform Incision/Wound Care With mod I Assistance. Outcome: Progressing   Problem: RH SAFETY Goal: RH STG ADHERE TO SAFETY PRECAUTIONS W/ASSISTANCE/DEVICE Description: STG Adhere to Safety Precautions With mod I Assistance/Device. Outcome: Progressing Goal: RH STG DECREASED RISK OF FALL WITH ASSISTANCE Description: STG Decreased Risk of Fall With mod I Assistance. Outcome: Progressing   Problem: RH PAIN  MANAGEMENT Goal: RH STG PAIN MANAGED AT OR BELOW PT'S PAIN GOAL Description: <3 on 0-10 pain scale. Outcome: Progressing   Problem: RH KNOWLEDGE DEFICIT SCI Goal: RH STG INCREASE KNOWLEDGE OF SELF CARE AFTER SCI Description: Patient will demonstrate knowledge of medication management, bowel and bladder management, pain management, wound and skin care management, weight bearing precautions with educational materials and handouts with cues and reminders from rehab staff. Outcome: Progressing
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
## 2020-11-11 NOTE — Progress Notes (Signed)
Dig stim done with suppository then 15 min later another dig stim done with medium MB.

## 2020-11-11 NOTE — Plan of Care (Signed)
  Problem: Consults Goal: RH SPINAL CORD INJURY PATIENT EDUCATION Description:  See Patient Education module for education specifics.  Outcome: Progressing Goal: Skin Care Protocol Initiated - if Braden Score 18 or less Description: If consults are not indicated, leave blank or document N/A Outcome: Progressing   Problem: SCI BOWEL ELIMINATION Goal: RH STG MANAGE BOWEL WITH ASSISTANCE Description: STG Manage Bowel with mod I Assistance. Outcome: Progressing Goal: RH STG SCI MANAGE BOWEL WITH MEDICATION WITH ASSISTANCE Description: STG SCI Manage bowel with medication with mod I assistance. Outcome: Progressing Goal: RH STG SCI MANAGE BOWEL PROGRAM W/ASSIST OR AS APPROPRIATE Description: STG SCI Manage bowel program w/ mod I assist or as appropriate. Outcome: Progressing   Problem: SCI BLADDER ELIMINATION Goal: RH STG MANAGE BLADDER WITH ASSISTANCE Description: STG Manage Bladder With mod I Assistance Outcome: Progressing Goal: RH STG MANAGE BLADDER WITH MEDICATION WITH ASSISTANCE Description: STG Manage Bladder With Medication With mod I Assistance. Outcome: Progressing   Problem: RH SKIN INTEGRITY Goal: RH STG SKIN FREE OF INFECTION/BREAKDOWN Description: Skin to remain free of infection and breakdown with mod I assist. Outcome: Progressing Goal: RH STG MAINTAIN SKIN INTEGRITY WITH ASSISTANCE Description: STG Maintain Skin Integrity With mod I Assistance. Outcome: Progressing Goal: RH STG ABLE TO PERFORM INCISION/WOUND CARE W/ASSISTANCE Description: STG Able To Perform Incision/Wound Care With mod I Assistance. Outcome: Progressing   Problem: RH SAFETY Goal: RH STG ADHERE TO SAFETY PRECAUTIONS W/ASSISTANCE/DEVICE Description: STG Adhere to Safety Precautions With mod I Assistance/Device. Outcome: Progressing Goal: RH STG DECREASED RISK OF FALL WITH ASSISTANCE Description: STG Decreased Risk of Fall With mod I Assistance. Outcome: Progressing   Problem: RH PAIN  MANAGEMENT Goal: RH STG PAIN MANAGED AT OR BELOW PT'S PAIN GOAL Description: <3 on 0-10 pain scale. Outcome: Progressing   Problem: RH KNOWLEDGE DEFICIT SCI Goal: RH STG INCREASE KNOWLEDGE OF SELF CARE AFTER SCI Description: Patient will demonstrate knowledge of medication management, bowel and bladder management, pain management, wound and skin care management, weight bearing precautions with educational materials and handouts with cues and reminders from rehab staff. Outcome: Progressing   

## 2020-11-11 NOTE — Progress Notes (Signed)
Patient refused his CHG bath, he said it dry his skin and that the MD told him he can refuse it if he wanted to, he also said he has been taking shower with therapy. Patient educated.

## 2020-11-12 ENCOUNTER — Inpatient Hospital Stay (HOSPITAL_COMMUNITY): Payer: BC Managed Care – PPO | Admitting: Occupational Therapy

## 2020-11-12 ENCOUNTER — Inpatient Hospital Stay (HOSPITAL_COMMUNITY): Payer: BC Managed Care – PPO

## 2020-11-12 NOTE — Progress Notes (Signed)
Physical Therapy Session Note  Patient Details  Name: Kevin Whitehead. MRN: [id number] Date of Birth: 1981/05/28  Today's Date: 11/12/2020 PT Individual Time: 2094-7096 PT Individual Time Calculation (min): 69 min   Short Term Goals: Week 1:  PT Short Term Goal 1 (Week 1): STG=LTG due to ELOS  Skilled Therapeutic Interventions/Progress Updates:     Pt received sidelying in bed and agrees to therapy. Sidelying to sit with supervision and cue sfor sequencing and body mechanics. Stand pivot transfer to [facility] with RW and CGA. WC transport outside for time management and energy conservation. Pt ambulates 275' with RW and CGA, with Pt cuing for upright gaze to improve posture and balance, and decreasing WB through RW for energy conservation. Following seated rest break, pt is agreeable to attempting ambulation without AD. Ambulation performed outside for practice ambulating over unlevel, varying surfaces. Pt ambulates x2 bouts of 275' with PT providing CGA/minA at hips for stability. During 2nd bout, pt reports slight increase in pain in bilateral legs, shooting in nature, but pain is alleviated with standing rest break. PT cues for increasing gait speed to improve effieincy and decrease risk for falls. Pt requesting therabands to use for in-room therex. PT provides pt with level 2 and level 4 theraband with education on use. Pt left seated in WC with all needs within reach.  Therapy Documentation Precautions:  Precautions Precautions: Fall,Back Precaution Comments: pt stated 3/3 precautions without cues, minimal cues for adherance during eval Required Braces or Orthoses: Spinal Brace Spinal Brace: Lumbar corset,Applied in sitting position Restrictions Weight Bearing Restrictions: No    Therapy/Group: Individual Therapy  Beau Fanny, PT, DPT 11/12/2020, 4:15 PM

## 2020-11-12 NOTE — Progress Notes (Signed)
Refused SCD's per orders educated and states he understand

## 2020-11-12 NOTE — Progress Notes (Addendum)
Occupational Therapy Session Note  Patient Details  Name: Kevin Whitehead. MRN: [id number] Date of Birth: Nov 18, 1981  Today's Date: 11/12/2020 OT Individual Time: 3300-7622 and 1445-1530 OT Individual Time Calculation (min): 57 min and 45 min  Short Term Goals: Week 1:  OT Short Term Goal 1 (Week 1): STGs = LTGs d/t ELOS  Skilled Therapeutic Interventions/Progress Updates:    Pt greeted in bed and premedicated for pain. Due to RN just changing his back dressings, pt opted to complete bathing/dressing tasks sit<stand at the sink today. Supine<sit from flat bed without bedrail using logroll technique completed with setup assistance and pt adhering to his back precautions well. He donned his LSO while sitting EOB with min cuing, assistance for gripper socks due to inability to obtain seated figure 4 position. CGA for ambulatory transfer to the w/c parked beside sink using his RW. Pt then dressed at sit<stand level with CGA for dynamic standing balance, assistance for thoroughness when washing buttocks due to sensation deficits, and cues for using the reacher + wash cloth as an adaptive method to wash his feet. OT set his brief up as underwear and pt was able to thread both LEs into his pants with assistance for managing his foley. We made plans to engage in sock aide training during OT session later today. At end of session pt remained sitting up in the w/c with wife [name] present, who is a trained caregiver in regards to his transfer needs. Tx focus placed on adaptive self care skills, back precaution adherence during functional activity, standing balance, and pt education.  2nd Session 1:1 tx (45 min) Pt greeted in the w/c, already wearing his back brace and premedicated for pain. His wife, [name], was present today and observed session for ongoing family education. Pt ambulated with supervision using the RW to the therapy apartment. Started session with sock aide training to increase his  functional independence with donning footwear. Pt able to doff gripper socks using his feet alone and then don his gripper socks using the sock aide, supervision/cues fading to supervision/setup with practice. Transitioned to simulated walk-in shower transfers using the RW and without the RW. Pt required supervision with device and CGA without. Note that he had 1 lateral LOB without device when stepping over shower "ledge" requiring Mod A to prevent fall. On 2nd bout of simulated transfer without AD pt required CGA. Pt then ambulated using RW back to the room and transferred to his w/c. Left him with all needs within reach. Tx focus placed on adaptive self care skills and pt/family education.  Also provided him with a walker + reacher bag for his device, discussed functional use at home during ADL/IADL participation.   Therapy Documentation Precautions:  Precautions Precautions: Fall,Back Precaution Comments: pt stated 3/3 precautions without cues, minimal cues for adherance during eval Required Braces or Orthoses: Spinal Brace Spinal Brace: Lumbar corset,Applied in sitting position Restrictions Weight Bearing Restrictions: No Pain: Pain Assessment Pain Scale: 0-10 Pain Score: 0-No pain Pain Type: Acute pain Pain Location: Back Pain Orientation: Medial Pain Descriptors / Indicators: Aching Pain Frequency: Constant Pain Onset: On-going Patients Stated Pain Goal: 2 Pain Intervention(s): Medication (See eMAR) ADL: ADL Grooming: Setup Where Assessed-Grooming: Sitting at sink Upper Body Bathing: Supervision/safety Where Assessed-Upper Body Bathing: Sitting at sink Lower Body Bathing: Moderate assistance Where Assessed-Lower Body Bathing: Sitting at sink Upper Body Dressing: Minimal assistance Lower Body Dressing: Moderate assistance Where Assessed-Lower Body Dressing: Sitting at sink,Standing at sink Toileting: Maximal assistance Where  Assessed-Toileting: Loss adjuster, chartered Method: Ambulating      Therapy/Group: Individual Therapy  Sonja Manseau A Aalaiyah Yassin 11/12/2020, 2:25 PM

## 2020-11-12 NOTE — Progress Notes (Signed)
Dig stim done per order then suppository, 15 min later another dig stim on BSC with small MB. We continue to monitor.
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
## 2020-11-12 NOTE — Plan of Care (Signed)
  Problem: Consults Goal: RH SPINAL CORD INJURY PATIENT EDUCATION Description:  See Patient Education module for education specifics.  Outcome: Progressing Goal: Skin Care Protocol Initiated - if Braden Score 18 or less Description: If consults are not indicated, leave blank or document N/A Outcome: Progressing   Problem: SCI BOWEL ELIMINATION Goal: RH STG MANAGE BOWEL WITH ASSISTANCE Description: STG Manage Bowel with mod I Assistance. Outcome: Progressing Goal: RH STG SCI MANAGE BOWEL WITH MEDICATION WITH ASSISTANCE Description: STG SCI Manage bowel with medication with mod I assistance. Outcome: Progressing Goal: RH STG SCI MANAGE BOWEL PROGRAM W/ASSIST OR AS APPROPRIATE Description: STG SCI Manage bowel program w/ mod I assist or as appropriate. Outcome: Progressing   Problem: SCI BLADDER ELIMINATION Goal: RH STG MANAGE BLADDER WITH ASSISTANCE Description: STG Manage Bladder With mod I Assistance Outcome: Progressing Goal: RH STG MANAGE BLADDER WITH MEDICATION WITH ASSISTANCE Description: STG Manage Bladder With Medication With mod I Assistance. Outcome: Progressing   Problem: RH SKIN INTEGRITY Goal: RH STG SKIN FREE OF INFECTION/BREAKDOWN Description: Skin to remain free of infection and breakdown with mod I assist. Outcome: Progressing Goal: RH STG MAINTAIN SKIN INTEGRITY WITH ASSISTANCE Description: STG Maintain Skin Integrity With mod I Assistance. Outcome: Progressing Goal: RH STG ABLE TO PERFORM INCISION/WOUND CARE W/ASSISTANCE Description: STG Able To Perform Incision/Wound Care With mod I Assistance. Outcome: Progressing   Problem: RH SAFETY Goal: RH STG ADHERE TO SAFETY PRECAUTIONS W/ASSISTANCE/DEVICE Description: STG Adhere to Safety Precautions With mod I Assistance/Device. Outcome: Progressing Goal: RH STG DECREASED RISK OF FALL WITH ASSISTANCE Description: STG Decreased Risk of Fall With mod I Assistance. Outcome: Progressing   Problem: RH PAIN  MANAGEMENT Goal: RH STG PAIN MANAGED AT OR BELOW PT'S PAIN GOAL Description: <3 on 0-10 pain scale. Outcome: Progressing   Problem: RH KNOWLEDGE DEFICIT SCI Goal: RH STG INCREASE KNOWLEDGE OF SELF CARE AFTER SCI Description: Patient will demonstrate knowledge of medication management, bowel and bladder management, pain management, wound and skin care management, weight bearing precautions with educational materials and handouts with cues and reminders from rehab staff. Outcome: Progressing   

## 2020-11-13 ENCOUNTER — Inpatient Hospital Stay (HOSPITAL_COMMUNITY): Payer: BC Managed Care – PPO | Admitting: Occupational Therapy

## 2020-11-13 ENCOUNTER — Inpatient Hospital Stay (HOSPITAL_COMMUNITY): Payer: BC Managed Care – PPO

## 2020-11-13 DIAGNOSIS — D62 Acute posthemorrhagic anemia: Secondary | ICD-10-CM

## 2020-11-13 DIAGNOSIS — E46 Unspecified protein-calorie malnutrition: Secondary | ICD-10-CM

## 2020-11-13 DIAGNOSIS — E8809 Other disorders of plasma-protein metabolism, not elsewhere classified: Secondary | ICD-10-CM

## 2020-11-13 DIAGNOSIS — G8918 Other acute postprocedural pain: Secondary | ICD-10-CM

## 2020-11-13 DIAGNOSIS — M792 Neuralgia and neuritis, unspecified: Secondary | ICD-10-CM

## 2020-11-13 DIAGNOSIS — N319 Neuromuscular dysfunction of bladder, unspecified: Secondary | ICD-10-CM

## 2020-11-13 MED ORDER — PROSOURCE PLUS PO LIQD
30.0000 mL | Freq: Two times a day (BID) | ORAL | Status: DC
  Administered 2020-11-13 – 2020-11-21 (×18): 30 mL via ORAL
  Filled 2020-11-13 (×16): qty 30

## 2020-11-13 MED ORDER — GABAPENTIN 100 MG PO CAPS
100.0000 mg | ORAL_CAPSULE | Freq: Three times a day (TID) | ORAL | Status: DC
  Administered 2020-11-13 – 2020-11-16 (×9): 100 mg via ORAL
  Filled 2020-11-13 (×10): qty 1

## 2020-11-13 NOTE — Progress Notes (Signed)
Physical Therapy Session Note  Patient Details  Name: Kevin Whitehead. MRN: [id number] Date of Birth: 1980-12-13  Today's Date: 11/13/2020 PT Individual Time: Session1: 6948-5462; Session2: 1130-1200 PT Individual Time Calculation (min): 60 min & 30 min  Short Term Goals: Week 1:  PT Short Term Goal 1 (Week 1): STG=LTG due to ELOS  Skilled Therapeutic Interventions/Progress Updates:    Session1:  Patient in supine and reports able to ambulate outside this weekend without AD.  Reports increased sharp pains in bilateral groin area this am.  Hopeful it means will have improved B&B sensation/control.  Pt. Supine to sit with S.  Donned brace with S.  Sit to stand CGA increased time.  Patient ambulated with RW and close S x 150'.  Standing at steps 5 x step ups w/ R then L first with seated rest in between cues for less UE support.  Sit to stand x 5 with hands on knees.  Sit to side on mat S.  Supine bridge x 5 w/core stabilization and 5 sec hold.  Sidelying clamshell hip abduction with green t-band x 10 each side. Patient side to sit with S on flat mat no rail, donned brace with S.  Sit to stand S and ambulated to room x 130' with RW and S.  Toileted in bathroom due to soiled brief with mod A for managing brief and for hygiene.  Patient left in armchair with mother in the room.   Session2: Patient in supine with mother in the room.  S for supine to sit and donning brace at EOB.  Reports pain improved since rested in bed.  Sit to stand S and pt ambulated with RW to ortho gym 100' with S.  Standing at [facility] for balance work no UE support feet apart for touching letters A-Z with S, then touching targets moving over screen with feet in partial tandem x 37 targets R foot in back then again with L foot in back.  Patient performed standing taps to 6" step no UE support with close S to CGA.  Ambulated x 300' with no device with CGA slow and controlled pace with c/o fear of pain returning so stayed near railing.  Left seated in armchair with call bell in reach and mother in the room.   Therapy Documentation Precautions:  Precautions Precautions: Fall,Back Precaution Comments: pt stated 3/3 precautions without cues, minimal cues for adherance during eval Required Braces or Orthoses: Spinal Brace Spinal Brace: Lumbar corset,Applied in sitting position Restrictions Weight Bearing Restrictions: No Pain: Pain Assessment Pain Scale: 0-10 Pain Score: 8  Pain Type: Acute pain Pain Location: Groin Pain Orientation: Right;Left Pain Descriptors / Indicators: Sharp Pain Frequency: Constant Pain Onset: On-going Patients Stated Pain Goal: 3 Pain Intervention(s): Repositioned;Rest    Therapy/Group: Individual Therapy  [name]  Sheran Lawless, PT 11/13/2020, 8:47 AM
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
## 2020-11-13 NOTE — Progress Notes (Signed)
Patient's wife educated on how to dig stm and she was able to perform it.

## 2020-11-13 NOTE — Progress Notes (Signed)
This nurse received report that Bowel program is complete after Bm on day shift.

## 2020-11-13 NOTE — Progress Notes (Signed)
Occupational Therapy Session Note  Patient Details  Name: Kevin Whitehead. MRN: [id number] Date of Birth: [birth year]  Today's Date: 11/13/2020 OT Individual Time: 1300-1330 and 1530-1640 OT Individual Time Calculation (min): 30 min and 70 min   Short Term Goals: Week 1:  OT Short Term Goal 1 (Week 1): STGs = LTGs d/t ELOS   Skilled Therapeutic Interventions/Progress Updates:    Session 1: Pt greeted at time of session supine in bed resting but agreeable to OT session, mother present. Supine to sit supervision and donned brace with supervision as well before walking to/from room <> gym with RW CGA/close supervision. Mother also practiced providing CGA/close supervision but would benefit from further practice. Once in gym pt had numerous questions regarding cauda equina syndrom, SCI, etc and majority of session spend talking about diagnosis before briefly performing dynamic standing/item retrieval from low level with cues to maintain back precautions. Ambulated back to room same manner, alarm on call bell in reach.    Session 2: Pt greeted at time of session sitting EOB with mother present, who did not stay for entire session. Pt and mother had question regarding schedule for tomorrow which were answered. Sit to stand and ambulated to ADL apartment CGA/close supervision with RW and ambulated throughout on carpet surface to simulate home environment. Transferred to multiple surfaces including low sofa with cues for hand placement and practice to adhere to back precautions when getting up/down from low furniture. ADL apartment > gym close supervision with RW and performed multiple dynamic standing activities including standing with no UE support, single leg stance, and lateral weight shifting in standing. NMR rebounder with large yellow 6kg ball for proprioceptive input and dynamic balance retraining for 2x20. Partial squats within tolerance for NMR and weight bearing 2x15 with cues for form. Pt  also ambulated thorughout gym and back to room no AD with CGA/close supervision. Extensive discussion regarding B&B program, incontinence, level of injury, and prognosis, encouraged to speak with MD in am as well. Alarm on call bell in reach.   Therapy Documentation Precautions:  Precautions Precautions: Fall,Back Precaution Comments: pt stated 3/3 precautions without cues, minimal cues for adherance during eval Required Braces or Orthoses: Spinal Brace Spinal Brace: Lumbar corset,Applied in sitting position Restrictions Weight Bearing Restrictions: No     Therapy/Group: Individual Therapy  Erasmo Score 11/13/2020, 2:28 PM

## 2020-11-13 NOTE — Progress Notes (Signed)
Dig stim done per order then suppository given, 15 min later another dig stim on the BSC with small BM.

## 2020-11-14 ENCOUNTER — Inpatient Hospital Stay (HOSPITAL_COMMUNITY): Payer: BC Managed Care – PPO

## 2020-11-14 ENCOUNTER — Encounter (HOSPITAL_COMMUNITY): Payer: BC Managed Care – PPO | Admitting: Psychology

## 2020-11-14 ENCOUNTER — Inpatient Hospital Stay (HOSPITAL_COMMUNITY): Payer: BC Managed Care – PPO | Admitting: Occupational Therapy

## 2020-11-14 ENCOUNTER — Inpatient Hospital Stay (HOSPITAL_COMMUNITY): Payer: BC Managed Care – PPO | Admitting: Physical Therapy

## 2020-11-14 MED ORDER — OXYCODONE HCL ER 15 MG PO T12A
15.0000 mg | EXTENDED_RELEASE_TABLET | Freq: Two times a day (BID) | ORAL | Status: DC
  Administered 2020-11-14 – 2020-11-17 (×6): 15 mg via ORAL
  Filled 2020-11-14 (×7): qty 1

## 2020-11-14 MED ORDER — WITCH HAZEL-GLYCERIN EX PADS
MEDICATED_PAD | CUTANEOUS | Status: DC | PRN
  Filled 2020-11-14 (×2): qty 100

## 2020-11-14 MED ORDER — LIDOCAINE HCL URETHRAL/MUCOSAL 2 % EX GEL
1.0000 "application " | CUTANEOUS | Status: DC | PRN
  Filled 2020-11-14 (×2): qty 5

## 2020-11-14 MED ORDER — LIDOCAINE HCL URETHRAL/MUCOSAL 2 % EX GEL
1.0000 "application " | Freq: Once | CUTANEOUS | Status: AC
  Administered 2020-11-14: 1 via URETHRAL
  Filled 2020-11-14: qty 5

## 2020-11-14 NOTE — Progress Notes (Signed)
Wife at bedside, wife watched I/O cath, answered questions appropriately,  Will have her return demo tomorrow, wife also did bowel program, was able to clean rectal vault, dig stem and administered suppository. Will have pt up to toilet for 2nd dig stem. Will report to next shift.

## 2020-11-14 NOTE — Patient Care Conference (Signed)
Inpatient RehabilitationTeam Conference and Plan of Care Update Date: 11/14/2020   Time: 11:28 AM    Patient Name: Kevin Whitehead.      Medical Record Number: [id number]  Date of Birth: 06/22/1981 Sex: Male         Room/Bed: 4M06C/4M06C-01 Payor Info: Payor: BLUE CROSS BLUE SHIELD / Plan: BCBS COMM PPO / Product Type: *No Product type* /    Admit Date/Time:  11/08/2020  2:31 PM  Primary Diagnosis:  Cauda equina compression [facility])  Hospital Problems: Principal Problem:   Cauda equina compression (HCC) Active Problems:   Acute blood loss anemia   Hypoalbuminemia due to protein-calorie malnutrition (HCC)   Neurogenic bladder   Neuropathic pain   Postoperative pain    Expected Discharge Date: Expected Discharge Date: 11/22/20  Team Members Present: Physician leading conference: Dr. Sula Soda Care Coodinator Present: Kennyth Arnold, RN, BSN, CRRN;Becky Dupree, LCSW Nurse Present: Willey Blade, RN PT Present: Sheran Lawless, PT OT Present: Earleen Newport, OT PPS Coordinator present : Edson Snowball, Park Breed, SLP     Current Status/Progress Goal Weekly Team Focus  Bowel/Bladder   Bowel managed with Bowel program small bowel movment charted on 12/27 patient needs I&O cath to void s/p foley removal 850cc out this am  improve bowel and bladder continence  assess toileting needs Q2hours and PRN and perform bladder scans and i &o caths per order   Swallow/Nutrition/ Hydration             ADL's   Min LB bathe/dress but given LHS which should improve, adhering to back precautions, CGA/close supervision ADL transfers and functional mobility, showering at  shower level, Mod A for toileting  Supervision overall  AE as needed during LB ADL, dynamic standing balance, hygiene and toileting techniques within back precautions   Mobility   S overall with RW, CGA without device.  Indep household distances, min A steps no rails  balance, LE strength, endurance   Communication              Safety/Cognition/ Behavioral Observations            Pain   Patient c/o pain 9/10 before medication with hydromorphone and diazepam resident pain well controlled with previous medication  Mainatin a pain goal under 3  Assess pain Q4hours and PRN   Skin   skin intact except for back incsison. back incsion dressing in place unable to assess  no new breakdown  Assess skin Qshift and PRN     Discharge Planning:  Home with wife who is currently working from home. Will need education on bladdr and boewl program   Team Discussion: Neurogenic B/B, foley dc'd 11/13/2020. I&O cath 11/14/2020 in AM 850 mL removed. Evening bowel program going well with small bowel movement 11/13/2020, wife assisted. OT reports patient is min assist for bathing and dressing, and ambulates at a contact guard assist. Goal is supervision. PT reports patient is at a supervision level with a RW. Goal is to be independent for household distances, and min assist with steps with no rails. Pain continues to be severe, MD to make medication adjustments. Patient on target to meet rehab goals: yes, with continued education on the bowel and bladder program.  *See Care Plan and progress notes for long and short-term goals.   Revisions to Treatment Plan:  Increase Oxycodone, 15 mg, q 12 hr.  Add Lidocaine for I&O caths  Teaching Needs: Family education Bowel program Bladder program Medication management Wound care  Current Barriers to Discharge: Decreased caregiver support, Home enviroment access/layout, Incontinence, Neurogenic bowel and bladder, Wound care, Lack of/limited family support and Behavior  Possible Resolutions to Barriers: Continue current medications, wound care management, bowel and bladder education, provide emotional support to patient and family.     Medical Summary Current Status: Pain continues to be severe, incontinence  Barriers to Discharge: Medical stability  Barriers to Discharge Comments:  Pain continues to be severe, neurogenic bowel, incontinence Possible Resolutions to Barriers/Weekly Focus: Increase oxycodone to 15mg  q12 hours, add lidocaine jelly for in and out caths, continue bowel and bladder program   Continued Need for Acute Rehabilitation Level of Care: The patient requires daily medical management by a physician with specialized training in physical medicine and rehabilitation for the following reasons: Direction of a multidisciplinary physical rehabilitation program to maximize functional independence : Yes Medical management of patient stability for increased activity during participation in an intensive rehabilitation regime.: Yes Analysis of laboratory values and/or radiology reports with any subsequent need for medication adjustment and/or medical intervention. : Yes   I attest that I was present, lead the team conference, and concur with the assessment and plan of the team.   11/14/2020, 5:32 PM
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
## 2020-11-14 NOTE — Progress Notes (Signed)
Physical Therapy Session Note  Patient Details  Name: Kevin Whitehead. MRN: [id number] Date of Birth: [birth year]  Today's Date: 11/14/2020 PT Individual Time:Session1: 0254-2706; [place]: 2376-2831 PT Individual Time Calculation (min): 45 min & 54 min  Short Term Goals: Week 1:  PT Short Term Goal 1 (Week 1): STG=LTG due to ELOS  Skilled Therapeutic Interventions/Progress Updates:    Session1: Patient in supine asleep, but easily aroused.  Patient reports poor sleep due to pain and catheter out and having to do in/out catheterizations.  Reports worked on steps this morning.  Supine for therex consisting of SLR, SAQ w/ 5 sec hold w/ 4# x 10 each. Sidelying hip abduction with 4# x 10, LE's on ball lateral trunk rolls x15, and lower abdominal flexion pulling knees up with LE's on ball.  Bridging x 5 with LE's on ball w/ 5 sec hold.  Supine to sit with S and donned brace at EOB.  Sit to stand x 4 with UE support on knees.  Seated trunk extension initially elbows on knees then pushing up partway and using trunk extensors for upright x 4.  Patient left seated EOB with mother in the room.  Session 2:Patient standing at sink brushing teeth with mother's supervision.  Reports feels less pain this pm.  Ambulated with RW into bathroom with S.  Standing to attempt to void and to remove brief.  Patient assisted initially with hygiene due to incontinent of bowel.  Then assisted to ensure cleaned.  Donned new brief in standing.  Patient washed hands at sink.  Ambulated to therapy gym with RW and S 150'.  Negotiated 4 steps with rail on L forward with L foot initially to ascend and R to descend, then practiced x 2 with R foot to ascend and L first to descend, but noted R foot up on toes due to immobility at ankle from prior injury.  Side step ups to R with 1 hand on rail x 5.  Patient in parallel bars stepping forward over cones no UE support, then side stepping over cones no UE support.  Patient quadruped on mat  for alternating arm lifts x 5, then LE hip extension x 10 with A for stabilizing trunk.  Patient standing in semitandem to bounce ball on rebounder with S x 20 with R foot in front, then with L foot in front, then standing with L foot on step.  Patient standing on foam for corn toss with L hand then with R hand feet apart initially, then with feet together.  Patient ambulated to room with S no AD.  Discussed needing to clean or toilet as noted odor, but pt reports mother able to assist and educated mother to let pt assist as well.  Left in bathroom with mother present to assist.   Therapy Documentation Precautions:  Precautions Precautions: Fall,Back Precaution Comments: pt stated 3/3 precautions without cues, minimal cues for adherance during eval Required Braces or Orthoses: Spinal Brace Spinal Brace: Lumbar corset,Applied in sitting position Restrictions Weight Bearing Restrictions: No Pain: Pain Assessment Pain Scale: 0-10 Pain Score: 9  Pain Type: Acute pain Pain Location: Back Pain Orientation: Lower Pain Descriptors / Indicators: Aching Pain Frequency: Constant Pain Onset: On-going Pain Intervention(s): Repositioned    Therapy/Group: Individual Therapy  [name]  Sheran Lawless, PT 11/14/2020, 9:47 AM

## 2020-11-14 NOTE — Progress Notes (Signed)
Physical Therapy Session Note  Patient Details  Name: Kevin Whitehead. MRN: [id number] Date of Birth: 02-Nov-1981  Today's Date: 11/14/2020 PT Individual Time: 0712-1975 PT Individual Time Calculation (min): 42 min   Short Term Goals: Week 1:  PT Short Term Goal 1 (Week 1): STG=LTG due to ELOS  Skilled Therapeutic Interventions/Progress Updates:    Pt received supine in bed and agreeable to therapy session. Supine>sitting L EOB, HOB flat but using bedrail, via logroll technique mod-I. Sitting EOB donned shirt, corset brace, and pants with supervision. Sit<>stands using RW with supervision for safety during session. Gait training ~182ft to main therapy gym using RW with close supervision for safety - demos reciprocal gait pattern with adequate gait speed and no significant gait deviations noted. Ascended/descended 4 steps using L HR only to replicate home entry with step-to pattern and CGA for safety - demos ability to recall proper sequencing ascending leading with L LE and descending leading with R LE. Performed repeated R LE step up/down on/off 1st 6" step using L UE support on HR targeting strengthening x10 reps with min assist - repeated with L LE using R UE support progressed to no UE support x10 reps with continued min assist for balance. Gait training ~30ft, no AD, with CGA for steadying with no significant changes in gait mechanics compared to with RW. Attempted sit<>stands to/from significantly elevated EOM without UE support but pt reports significant pain with this task therefore discontinued. Doffed corset. Sit>supine via reverse logroll mod-I. Supine bridging x10 reps with manual facilitation for increased glute activation and increased hip extension ROM - cuing for core activation prior to and during movement along with exhaling on exertion - demos increasing hip clearance with repetition and noted to have delayed recruitment of glutes during this activity. Supine>sitting EOM via logroll  mod-I and donned corset mod-I. Gait training ~185ft back to room using RW with supervision. Pt requesting to return to bed and rest due to not sleeping well last night. Sitting EOB doffed corset and performed sit>supine via reverse logroll mod-I. Pt left supine in bed with needs in reach and bed alarm on.  Therapy Documentation Precautions:  Precautions Precautions: Fall,Back Precaution Comments: pt stated 3/3 precautions without cues, minimal cues for adherance during eval Required Braces or Orthoses: Spinal Brace Spinal Brace: Lumbar corset,Applied in sitting position Restrictions Weight Bearing Restrictions: No  Pain: Reports 8/10 pain in back during mobility tasks - provided rest breaks and adjusted tasks to avoid increased pain.    Therapy/Group: Individual Therapy  Ginny Forth , PT, DPT, CSRS  11/14/2020, 7:50 AM

## 2020-11-14 NOTE — Progress Notes (Signed)
Patient ID: Kevin Whitehead., male   DOB: 1981/07/14, 38 y.o.   MRN: [id number] Met with pt and his Mom to discuss team conference supervision level goals and target discharge of 1/5. He was hoping to go home before new year, but understands need sot work on bowel and bladder programs. Wife has begun learning bowel program last night. Continue to work on discharge plans.

## 2020-11-14 NOTE — Progress Notes (Signed)
Pt had medium results with bowel programs

## 2020-11-14 NOTE — Progress Notes (Signed)
Occupational Therapy Session Note  Patient Details  Name: Kevin Whitehead. MRN: [id number] Date of Birth: May 08, 1981  Today's Date: 11/14/2020 OT Individual Time: 1300-1357 OT Individual Time Calculation (min): 57 min    Short Term Goals: Week 1:  OT Short Term Goal 1 (Week 1): STGs = LTGs d/t ELOS   Skilled Therapeutic Interventions/Progress Updates:    Pt greeted at time of session supine in bed resting with mother present, agreeable to OT session. Some back discomfort later in session with activity but no number given and rest breaks provided PRN. Pt agreeable to check for incontinence at beginning of session, ambulated to bathroom close supervision with RW and allowed therapist to check, very small BM smear and pt wanting to wait to change brief until later if needed. Reviewed techniques in standing for pt to perform hygiene at home without breaking back precautions and demonstrated understanding. Ambulated to gym w/ RW CGA/close supervision and performed 4 rounds of BITS in varying levels of difficulty graded as needed for single leg stance and using aeromat with occasional unilateral support. Seated EOM, pt performed x10 forward hip flexion while adhering to back precautions for forward reaching and roll with large therapy ball to encourage back extensors to engage and strengthen. Ambulated back to room CGA no AD. Supine in bed alarm on call bell in reach.   Therapy Documentation Precautions:  Precautions Precautions: Fall,Back Precaution Comments: pt stated 3/3 precautions without cues, minimal cues for adherance during eval Required Braces or Orthoses: Spinal Brace Spinal Brace: Lumbar corset,Applied in sitting position Restrictions Weight Bearing Restrictions: No     Therapy/Group: Individual Therapy  Erasmo Score 11/14/2020, 3:07 PM

## 2020-11-15 ENCOUNTER — Inpatient Hospital Stay (HOSPITAL_COMMUNITY): Payer: BC Managed Care – PPO | Admitting: Occupational Therapy

## 2020-11-15 ENCOUNTER — Inpatient Hospital Stay (HOSPITAL_COMMUNITY): Payer: BC Managed Care – PPO | Admitting: Physical Therapy

## 2020-11-15 MED ORDER — HYDROCORTISONE ACETATE 25 MG RE SUPP
25.0000 mg | Freq: Two times a day (BID) | RECTAL | Status: DC
  Administered 2020-11-15 – 2020-11-22 (×14): 25 mg via RECTAL
  Filled 2020-11-15 (×14): qty 1

## 2020-11-15 MED ORDER — DULOXETINE HCL 30 MG PO CPEP
60.0000 mg | ORAL_CAPSULE | Freq: Every day | ORAL | Status: DC
  Administered 2020-11-15 – 2020-11-21 (×7): 60 mg via ORAL
  Filled 2020-11-15 (×7): qty 2
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
## 2020-11-15 NOTE — Progress Notes (Addendum)
Physical Therapy Session Note  Patient Details  Name: Kevin Whitehead. MRN: [id number] Date of Birth: 02/21/1981  Today's Date: 11/15/2020 PT Individual Time: 1100-1210 PT Individual Time Calculation (min): 70 min   Short Term Goals: Week 1:  PT Short Term Goal 1 (Week 1): STG=LTG due to ELOS  Skilled Therapeutic Interventions/Progress Updates:    pt received standing at bedside with wife present and nursing present and agreeable to therapy. Pt reported no pain at rest however "some" pain in low back when he gets fatigued however denied needs from nursing. Pt reporting he has been attempting to urinate frequently, that he has the sensation and urge to urinate and would like to attempt, directed in restroom with walker supervision 10' attempted to urinate for several minutes however unable. Pt reported he still felt sensation but is unable to complete void, nursing reporting they have just completed bladder scan and will rescan after therapy session. Pt directed in gait with Rolling walker to day room 350' supervision, pt took brief seated rest break directed in airex pad balance training without UE support for reciprocal BUE shoulder flexion 2x10, CGA, then directed in progression of this to increase challenge with BLE reciprocal hip flexion with contralateral UE shoulder flexion, min A-CGA to complete initially one UE at walker but improved to no UE support. Pt then directed in 4" step ups with LLE lead and RLE 2x15 each improving consistently with VC for min A improved to CGA. Pt directed in lateral step ups on 4" step no UE support on LLE lead and initially min A and one UE support for RLE lead however improved overall to CGA no UE support. Pt then directed in x15 squats from mat table with one UE support and VC for technique to come to complete stand and then progressed to squats with one 7# handweight improving to CGA. Pt directed in gait training to return to room 350' no AD, CGA-supervision.  Pt left in room sitting bedside with wife present. All needs in reach and in good condition. Call light in hand.    Therapy Documentation Precautions:  Precautions Precautions: Fall,Back Precaution Comments: pt stated 3/3 precautions without cues, minimal cues for adherance during eval Required Braces or Orthoses: Spinal Brace Spinal Brace: Lumbar corset,Applied in sitting position Restrictions Weight Bearing Restrictions: No General:   Vital Signs:   Pain:   Mobility:   Locomotion :    Trunk/Postural Assessment :    Balance:   Exercises:   Other Treatments:      Therapy/Group: Individual Therapy  Barbaraann Faster 11/15/2020, 12:17 PM

## 2020-11-15 NOTE — Progress Notes (Signed)
Pt bladder scanned for 381cc. Pt educated on how to perform intermittent catherization on self using sterile technique. Pt supervised by nurse and walked through procedure. 600cc of amber, clear urine collected in drainage bag.

## 2020-11-15 NOTE — Progress Notes (Signed)
Occupational Therapy Session Note  Patient Details  Name: Kevin Whitehead. MRN: [id number] Date of Birth: [birth year]  Today's Date: 11/15/2020 OT Individual Time: 2330-0762 OT Individual Time Calculation (min): 72 min    Short Term Goals: Week 1:  OT Short Term Goal 1 (Week 1): STGs = LTGs d/t ELOS   Skilled Therapeutic Interventions/Progress Updates:    Pt greeted at time of session sitting EOB with wife present (who has been checked off for transfers) performing sit to stand exercises for strengthening. Pt agreeable to shower, ambulated bed > shower with Supervision AD partially but leaving when transferring to bench. Pt taking off LB clothing in standing and advised to doff standing infront of bench instead so he can sit to remove remainder of the way instead of "stepping out" of his clothing. Transferred to bench CGA before turning to face shower while adhering to back precautions. Pt performed UB/LB bathing with LHS Supervision except for brief stand to wash buttocks with LHS with CGA for safety only, pt with no LOB and able to perform well. Wife present for all ADL, feels comfortable providing supervision at home and this will count as family ed. Dried off in same manner using figure four for LB and pulling cloth front <> back for periarea. UB dress  Set up, LB dress Min A for brief only for straps and Supervision for donning pants. Pt/family ed throughout session for adaptive techniques to adhere to back precautions and perform safely at home, as well as how to cover incision for shower. Pt sitting EOB with wife present, all needs met.   Therapy Documentation Precautions:  Precautions Precautions: Fall,Back Precaution Comments: pt stated 3/3 precautions without cues, minimal cues for adherance during eval Required Braces or Orthoses: Spinal Brace Spinal Brace: Lumbar corset,Applied in sitting position Restrictions Weight Bearing Restrictions: No      Therapy/Group: Individual  Therapy  Viona Gilmore 11/15/2020, 4:44 PM

## 2020-11-15 NOTE — Progress Notes (Signed)
Pt refused bisacodyl suppository due to multiple loose stools. Patient wife performed digital stimulation, and stated that patient had medium soft stool.

## 2020-11-16 ENCOUNTER — Inpatient Hospital Stay (HOSPITAL_COMMUNITY): Payer: BC Managed Care – PPO

## 2020-11-16 ENCOUNTER — Inpatient Hospital Stay (HOSPITAL_COMMUNITY): Payer: BC Managed Care – PPO | Admitting: Physical Therapy

## 2020-11-16 ENCOUNTER — Inpatient Hospital Stay (HOSPITAL_COMMUNITY): Payer: BC Managed Care – PPO | Admitting: Occupational Therapy

## 2020-11-16 NOTE — Progress Notes (Signed)
Occupational Therapy Session Note  Patient Details  Name: Kevin Whitehead. MRN: [id number] Date of Birth: 04-25-1981  Today's Date: 11/16/2020 OT Individual Time: 1030-1100 OT Individual Time Calculation (min): 30 min    Short Term Goals: Week 1:  OT Short Term Goal 1 (Week 1): STGs = LTGs d/t ELOS  Skilled Therapeutic Interventions/Progress Updates:    Pt received napping in bed with wife. Both woke easily and pt able to sit to EOB without A.   Discussed his progress and what he feels needs to work on the most. Pt discussed his leg strength to be able to do stairs easily.  Without AD, he ambulated from room to main gym with S. Worked on step ups using 6 inch step and B handrails with light support when he was stepping up with L foot and no hand support when stepping up with R foot. Pt worked on this for numerous repetitions with no fatigue.  He then ambulated back to room.   Discussed his bowel program and his hopes he will be able to gain more continence prior to going home.  Pt in room with spouse and all needs met.   Therapy Documentation Precautions:  Precautions Precautions: Fall,Back Precaution Comments: pt stated 3/3 precautions without cues, minimal cues for adherance during eval Required Braces or Orthoses: Spinal Brace Spinal Brace: Lumbar corset,Applied in sitting position Restrictions Weight Bearing Restrictions: No    Pain: Pt states his pain is tolerable at this time.     Therapy/Group: Individual Therapy  [place] 11/16/2020, 11:57 AM

## 2020-11-16 NOTE — Progress Notes (Signed)
Occupational Therapy Weekly Progress Note  Patient Details  Name: Kevin Whitehead. MRN: [id number] Date of Birth: [birth year]  Beginning of progress report period: November 09, 2020 End of progress report period: November 16, 2020  Today's Date: 11/16/2020 OT Individual Time: 4098-1191 OT Individual Time Calculation (min): 42 min    Patient did not have STGs as he is staying longer than originally expected. Pt is currently overall Supervision with ADLs, occasional CGA for dynamic standing for LB self care and Min-Mod A for hygiene per nursing and other staff as the pt has not had a BM yet with this therapist and only been able to simulate self care for hygiene s/p toileting with compensatory techniques and adaptive equipment. Note family education completed with wife Morey Hummingbird on 12/29 for shower level bathing and dressing tasks.   Patient continues to demonstrate the following deficits: muscle weakness, decreased cardiorespiratoy endurance, impaired timing and sequencing, unbalanced muscle activation and decreased motor planning and decreased standing balance, decreased postural control and decreased balance strategies and therefore will continue to benefit from skilled OT intervention to enhance overall performance with BADL and Reduce care partner burden.  Patient progressing toward long term goals..  Plan of care revisions: Goals upgraded to Mod I.  OT Short Term Goals Week 1:  OT Short Term Goal 1 (Week 1): STGs = LTGs d/t ELOS OT Short Term Goal 1 - Progress (Week 1): Progressing toward goal Week 2:  OT Short Term Goal 1 (Week 2): STGs = LTGs at upgraded Mod I level  Skilled Therapeutic Interventions/Progress Updates:    Pt greeted at time of session in room with wife present, agreeable to OT session and reports his baseline pain with no number given, no s/s of pain throughout and able to participate. Ambulated room <> gym with supervision/CGA with no AD and brace on. Declined ADL as he  was already dressed today. On EOM, pt performed hip flexion with large therapy ball while maintaining back precautions for low back stretch and strengthen back extensors with trunk elevation. Figure four stretch slowly working R foot up L leg and vice versa with static hold to decrease hip tightness and promote flexibility for LB ADL. Discussion and planning throughout session for DC home as well next week, pt feeling good about going home. Back in room with wife present who has been checked off for transfers. All needs met.    Therapy Documentation Precautions:  Precautions Precautions: Fall,Back Precaution Comments: pt stated 3/3 precautions without cues, minimal cues for adherance during eval Required Braces or Orthoses: Spinal Brace Spinal Brace: Lumbar corset,Applied in sitting position Restrictions Weight Bearing Restrictions: No     Therapy/Group: Individual Therapy  Viona Gilmore 11/16/2020, 7:26 AM
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
## 2020-11-16 NOTE — Plan of Care (Signed)
°  Problem: RH Car Transfers Goal: LTG Patient will perform car transfers with assist (PT) Description: LTG: Patient will perform car transfers with assistance (PT). Flowsheets (Taken 11/16/2020 0906) LTG: Pt will perform car transfers with assist:: Independent with assistive device    Problem: RH Ambulation Goal: LTG Patient will ambulate in controlled environment (PT) Description: LTG: Patient will ambulate in a controlled environment, # of feet with assistance (PT). Flowsheets (Taken 11/16/2020 0906) LTG: Pt will ambulate in controlled environ  assist needed:: Independent with assistive device LTG: Ambulation distance in controlled environment: >500 using cane or no AD   Problem: RH Ambulation Goal: LTG Patient will ambulate in community environment (PT) Description: LTG: Patient will ambulate in community environment, # of feet with assistance (PT). Flowsheets (Taken 11/16/2020 0906) LTG: Pt will ambulate in community environ  assist needed:: Independent with assistive device LTG: Ambulation distance in community environment: 500 w/ LRAD   Problem: RH Stairs Goal: LTG Patient will ambulate up and down stairs w/assist (PT) Description: LTG: Patient will ambulate up and down # of stairs with assistance (PT) Flowsheets (Taken 11/16/2020 0906) LTG: Pt will ambulate up/down stairs assist needed:: Independent with assistive device LTG: Pt will  ambulate up and down number of stairs: 3 steps no rails for home entry  Goals upgraded due to progress. Sheran Lawless, PT 11/16/2020

## 2020-11-16 NOTE — Progress Notes (Signed)
Physical Therapy Weekly Progress Note  Patient Details  Name: Kevin Whitehead. MRN: [id number] Date of Birth: 03-28-1981  Beginning of progress report period: November 08, 2020 End of progress report period: November 16, 2020  Today's Date: 11/16/2020 PT Individual Time: 1224-4975 PT Individual Time Calculation (min): 47 min  Missed 13 minutes skilled PT due to toileting.   Patient has met  initial short term goals for supervision level, but LTG's upgraded for ambulation, car transfers and stair negotiation.  Patient continues to demonstrate the following deficits muscle weakness and pain and decreased coordination and therefore will continue to benefit from skilled PT intervention to increase functional independence with mobility.  Patient progressing toward long term goals..  Continue plan of care.  PT Short Term Goals Week 1:  PT Short Term Goal 1 (Week 1): STG=LTG due to ELOS PT Short Term Goal 1 - Progress (Week 1): Progressing toward goal Week 2:  PT Short Term Goal 1 (Week 2): STG=LTG due to ELOS  Skilled Therapeutic Interventions/Progress Updates:  Ambulation/gait training;Discharge planning;DME/adaptive equipment instruction;Functional mobility training;Pain management;Psychosocial support;Splinting/orthotics;Therapeutic Activities;UE/LE Strength taining/ROM;Balance/vestibular training;Community reintegration;Disease management/prevention;Functional electrical stimulation;Neuromuscular re-education;Patient/family education;Skin care/wound management;Stair training;Therapeutic Exercise;UE/LE Coordination activities   Patient in bathroom upon PT entry with wife assisting for bowel program.  Missed 13 minutes due to toileting.  Patient ambulated with RW and S to therapy gym x 150'.  Stair negotiation with intermittent rail use step over step technique. Step ups with R x 10 to 6" step, in parallel bars hip extension and abduction x 15 each leg w/ green t-band.  Sit to stand x 2 no  UE support, limited due to increased back pain.  Sit to supine mod I.  Performed hooklying hip abduction with blue t-band x 15, supine bridging x 15 with ball between knees. Performed supine to sit mod I.  Donned brace.  Sit to stand S and pt ambulated to room without AD with Sx 140'.  Seated in armchair with wife in the room and needs in reach.  Therapy Documentation Precautions:  Precautions Precautions: Fall,Back Precaution Comments: pt stated 3/3 precautions without cues, minimal cues for adherance during eval Required Braces or Orthoses: Spinal Brace Spinal Brace: Lumbar corset,Applied in sitting position Restrictions Weight Bearing Restrictions: No Pain: Pain Assessment Pain Scale: 0-10 Pain Score: 9    Therapy/Group: Individual Therapy  Reginia Naas  Magda Kiel, PT 11/16/2020, 8:27 AM

## 2020-11-16 NOTE — Plan of Care (Signed)
  Problem: Consults Goal: RH SPINAL CORD INJURY PATIENT EDUCATION Description:  See Patient Education module for education specifics.  Outcome: Progressing Goal: Skin Care Protocol Initiated - if Braden Score 18 or less Description: If consults are not indicated, leave blank or document N/A Outcome: Progressing   Problem: SCI BOWEL ELIMINATION Goal: RH STG MANAGE BOWEL WITH ASSISTANCE Description: STG Manage Bowel with mod I Assistance. Outcome: Progressing Goal: RH STG SCI MANAGE BOWEL WITH MEDICATION WITH ASSISTANCE Description: STG SCI Manage bowel with medication with mod I assistance. Outcome: Progressing Goal: RH STG SCI MANAGE BOWEL PROGRAM W/ASSIST OR AS APPROPRIATE Description: STG SCI Manage bowel program w/ mod I assist or as appropriate. Outcome: Progressing   Problem: SCI BLADDER ELIMINATION Goal: RH STG MANAGE BLADDER WITH ASSISTANCE Description: STG Manage Bladder With mod I Assistance Outcome: Progressing Goal: RH STG MANAGE BLADDER WITH MEDICATION WITH ASSISTANCE Description: STG Manage Bladder With Medication With mod I Assistance. Outcome: Progressing   Problem: RH SKIN INTEGRITY Goal: RH STG SKIN FREE OF INFECTION/BREAKDOWN Description: Skin to remain free of infection and breakdown with mod I assist. Outcome: Progressing Goal: RH STG MAINTAIN SKIN INTEGRITY WITH ASSISTANCE Description: STG Maintain Skin Integrity With mod I Assistance. Outcome: Progressing Goal: RH STG ABLE TO PERFORM INCISION/WOUND CARE W/ASSISTANCE Description: STG Able To Perform Incision/Wound Care With mod I Assistance. Outcome: Progressing   Problem: RH SAFETY Goal: RH STG ADHERE TO SAFETY PRECAUTIONS W/ASSISTANCE/DEVICE Description: STG Adhere to Safety Precautions With mod I Assistance/Device. Outcome: Progressing Goal: RH STG DECREASED RISK OF FALL WITH ASSISTANCE Description: STG Decreased Risk of Fall With mod I Assistance. Outcome: Progressing   Problem: RH PAIN  MANAGEMENT Goal: RH STG PAIN MANAGED AT OR BELOW PT'S PAIN GOAL Description: <3 on 0-10 pain scale. Outcome: Progressing   Problem: RH KNOWLEDGE DEFICIT SCI Goal: RH STG INCREASE KNOWLEDGE OF SELF CARE AFTER SCI Description: Patient will demonstrate knowledge of medication management, bowel and bladder management, pain management, wound and skin care management, weight bearing precautions with educational materials and handouts with cues and reminders from rehab staff. Outcome: Progressing   

## 2020-11-16 NOTE — Progress Notes (Signed)
Physical Therapy Session Note  Patient Details  Name: Kevin Whitehead. MRN: [id number] Date of Birth: August 23, 1981  Today's Date: 11/16/2020 PT Individual Time: 1600-1700 PT Individual Time Calculation (min): 60 min   Short Term Goals: Week 2:  PT Short Term Goal 1 (Week 2): STG=LTG due to ELOS  Skilled Therapeutic Interventions/Progress Updates:   Pt received sitting in recliner and agreeable to PT. Throughout session sit<>stand without assist from Pt from various surfaces.   Gait training in hall with distant supervision assist and no AD, 181f, 1832fand 25021fMin cues from PT intermittently at avoid automatic doorsd  Dynamic balance training while stading on airex to toss ball off trampoline.  Wide BOS,  Narrow BOS,  Semi tandem. With lateral toss to R and then L Each completed 2 x 30 sec with supervision assist from PT and cues for ankle strategy to correct LOB  Agility ladder: 1 foot in each space, side stepping, forward in/out, lateral in/out. Supervision assist throughout with min cues for attention to task and proper sequence.   nustep BLE/BUE endurance training x 10 min, level 6. Pt rates Borg RPE 12/20 upon completion.   Patient returned to room and left sitting EOB with call bell in reach and all needs met.           Therapy Documentation Precautions:  Precautions Precautions: Fall,Back Precaution Comments: pt stated 3/3 precautions without cues, minimal cues for adherance during eval Required Braces or Orthoses: Spinal Brace Spinal Brace: Lumbar corset,Applied in sitting position Restrictions Weight Bearing Restrictions: No    Pain: Pain Assessment Pain Score: 3     Therapy/Group: Individual Therapy  AusLorie Phenix/30/2021, 5:53 PM

## 2020-11-17 ENCOUNTER — Inpatient Hospital Stay (HOSPITAL_COMMUNITY): Payer: BC Managed Care – PPO | Admitting: Occupational Therapy

## 2020-11-17 ENCOUNTER — Inpatient Hospital Stay (HOSPITAL_COMMUNITY): Payer: BC Managed Care – PPO | Admitting: Physical Therapy

## 2020-11-17 ENCOUNTER — Inpatient Hospital Stay (HOSPITAL_COMMUNITY): Payer: BC Managed Care – PPO

## 2020-11-17 MED ORDER — OXYCODONE HCL ER 10 MG PO T12A
20.0000 mg | EXTENDED_RELEASE_TABLET | Freq: Two times a day (BID) | ORAL | Status: DC
  Administered 2020-11-17 – 2020-11-22 (×10): 20 mg via ORAL
  Filled 2020-11-17 (×10): qty 2

## 2020-11-17 NOTE — Progress Notes (Signed)
Occupational Therapy Session Note  Patient Details  Name: Kevin Whitehead. MRN: [id number] Date of Birth: January 24, 1981  Today's Date: 11/17/2020 OT Individual Time: 1017-5102 OT Individual Time Calculation (min): 56 min   Short Term Goals: Week 1:  OT Short Term Goal 1 (Week 1): STGs = LTGs d/t ELOS OT Short Term Goal 1 - Progress (Week 1): Progressing toward goal  Skilled Therapeutic Interventions/Progress Updates:    Pt greeted in bed and premedicated for pain. Agreeable to take a shower this AM. He wanted to use the RW for safety because he stated feeling somewhat "unsteady" in the morning. Supine<sit from flat bed without bedrail completed unassisted, Mod I for ambulatory transfer to the TTB using device after he first donned his LSO. Pt bathed with setup assist in sitting, utilizing lateral leans and LH sponge as needed. Cues for setting up the brief so he could don it by himself. Setup for pants, gripper socks, and UB dressing. Pt then ambulated to his dresser with device to gather his toothbrush and toothpaste. Oral care completed while standing at the sink. Once he was finished, pt ambulated without device to the gym and back, no LOBs. Logroll back to bed completed unassisted after he doffed his brace, bed once again set up to be flat without bedrails. Left him with all needs within reach and bed alarm set.   Therapy Documentation Precautions:  Precautions Precautions: Fall,Back Precaution Comments: pt stated 3/3 precautions without cues, minimal cues for adherance during eval Required Braces or Orthoses: Spinal Brace Spinal Brace: Lumbar corset,Applied in sitting position Restrictions Weight Bearing Restrictions: No Vital Signs: Therapy Vitals Temp: 98.5 F (36.9 C) Pulse Rate: 99 Resp: 16 BP: 110/70 Oxygen Therapy SpO2: 97 % Pain: Pain Assessment Pain Score: 7  Pain Type: Acute pain Pain Location: Back Pain Descriptors / Indicators: Aching Pain Intervention(s):  Repositioned ADL: ADL Grooming: Setup Where Assessed-Grooming: Sitting at sink Upper Body Bathing: Supervision/safety Where Assessed-Upper Body Bathing: Sitting at sink Lower Body Bathing: Moderate assistance Where Assessed-Lower Body Bathing: Sitting at sink Upper Body Dressing: Minimal assistance Lower Body Dressing: Moderate assistance Where Assessed-Lower Body Dressing: Sitting at sink,Standing at sink Toileting: Maximal assistance Where Assessed-Toileting: IT consultant Method: Ambulating      Therapy/Group: Individual Therapy  [name] [name] 11/17/2020, 3:15 PM

## 2020-11-17 NOTE — Progress Notes (Signed)
Physical Therapy Session Note  Patient Details  Name: Kevin Whitehead. MRN: [id number] Date of Birth: [birth year]  Today's Date: 11/17/2020 PT Individual Time: 1400-1455 PT Individual Time Calculation (min): 55 min   Short Term Goals: Week 2:  PT Short Term Goal 1 (Week 2): STG=LTG due to ELOS  Skilled Therapeutic Interventions/Progress Updates:    Patient in supine, reports wife and mother not yet here today due to mother not feeling well so hasn't been up as much.  Patient supine to sit S with elevated HOB, discussed using sidelying for best technique.  Patient donned brace and sit to stand with S.  Patient ambulated with S no AD to therapy gym.  Performed Berg balance assessment and scored 46/56 more due to back precautions than balance.  Patient supine for therex to engage transverse abdominis to perform hip abd/add, marching, prone heel squeeze, quadruped alternate leg lifts and sidelying clamshell hip abduction.  Patient needing min A for transitioning prone to quadruped and brace donned for safety in this position.  Patient cued throughout to engage pelvic floor and transverse abdominis.  Patient S to stand and ambulated to room with S no device.  Patient sit to supine with S and left with all needs in reach.   Therapy Documentation Precautions:  Precautions Precautions: Fall,Back Precaution Comments: pt stated 3/3 precautions without cues, minimal cues for adherance during eval Required Braces or Orthoses: Spinal Brace Spinal Brace: Lumbar corset,Applied in sitting position Restrictions Weight Bearing Restrictions: No Pain: Pain Assessment Pain Score: 7  Pain Type: Acute pain Pain Location: Back Pain Descriptors / Indicators: Aching Pain Intervention(s): Repositioned    Balance: Balance Balance Assessed: Yes Standardized Balance Assessment Standardized Balance Assessment: Berg Balance Test Berg Balance Test Sit to Stand: Able to stand  independently using  hands Standing Unsupported: Able to stand safely 2 minutes Sitting with Back Unsupported but Feet Supported on Floor or Stool: Able to sit safely and securely 2 minutes Stand to Sit: Sits safely with minimal use of hands Transfers: Able to transfer safely, minor use of hands Standing Unsupported with Eyes Closed: Able to stand 10 seconds safely Standing Ubsupported with Feet Together: Able to place feet together independently and stand 1 minute safely From Standing, Reach Forward with Outstretched Arm: Can reach forward >12 cm safely (5") From Standing Position, Pick up Object from Floor: Unable to try/needs assist to keep balance From Standing Position, Turn to Look Behind Over each Shoulder: Turn sideways only but maintains balance Turn 360 Degrees: Able to turn 360 degrees safely but slowly Standing Unsupported, Alternately Place Feet on Step/Stool: Able to stand independently and safely and complete 8 steps in 20 seconds Standing Unsupported, One Foot in Front: Able to place foot tandem independently and hold 30 seconds Standing on One Leg: Able to lift leg independently and hold > 10 seconds Total Score: 46    Therapy/Group: Individual Therapy  [name]  [place], [place] 11/17/2020, 2:23 PM

## 2020-11-17 NOTE — Plan of Care (Signed)
  Problem: Consults Goal: RH SPINAL CORD INJURY PATIENT EDUCATION Description:  See Patient Education module for education specifics.  Outcome: Progressing Goal: Skin Care Protocol Initiated - if Braden Score 18 or less Description: If consults are not indicated, leave blank or document N/A Outcome: Progressing   Problem: SCI BOWEL ELIMINATION Goal: RH STG MANAGE BOWEL WITH ASSISTANCE Description: STG Manage Bowel with mod I Assistance. Outcome: Progressing Goal: RH STG SCI MANAGE BOWEL WITH MEDICATION WITH ASSISTANCE Description: STG SCI Manage bowel with medication with mod I assistance. Outcome: Progressing Goal: RH STG SCI MANAGE BOWEL PROGRAM W/ASSIST OR AS APPROPRIATE Description: STG SCI Manage bowel program w/ mod I assist or as appropriate. Outcome: Progressing   Problem: SCI BLADDER ELIMINATION Goal: RH STG MANAGE BLADDER WITH ASSISTANCE Description: STG Manage Bladder With mod I Assistance Outcome: Progressing Goal: RH STG MANAGE BLADDER WITH MEDICATION WITH ASSISTANCE Description: STG Manage Bladder With Medication With mod I Assistance. Outcome: Progressing   Problem: RH SKIN INTEGRITY Goal: RH STG SKIN FREE OF INFECTION/BREAKDOWN Description: Skin to remain free of infection and breakdown with mod I assist. Outcome: Progressing Goal: RH STG MAINTAIN SKIN INTEGRITY WITH ASSISTANCE Description: STG Maintain Skin Integrity With mod I Assistance. Outcome: Progressing Goal: RH STG ABLE TO PERFORM INCISION/WOUND CARE W/ASSISTANCE Description: STG Able To Perform Incision/Wound Care With mod I Assistance. Outcome: Progressing   Problem: RH SAFETY Goal: RH STG ADHERE TO SAFETY PRECAUTIONS W/ASSISTANCE/DEVICE Description: STG Adhere to Safety Precautions With mod I Assistance/Device. Outcome: Progressing Goal: RH STG DECREASED RISK OF FALL WITH ASSISTANCE Description: STG Decreased Risk of Fall With mod I Assistance. Outcome: Progressing   Problem: RH PAIN  MANAGEMENT Goal: RH STG PAIN MANAGED AT OR BELOW PT'S PAIN GOAL Description: <3 on 0-10 pain scale. Outcome: Progressing   Problem: RH KNOWLEDGE DEFICIT SCI Goal: RH STG INCREASE KNOWLEDGE OF SELF CARE AFTER SCI Description: Patient will demonstrate knowledge of medication management, bowel and bladder management, pain management, wound and skin care management, weight bearing precautions with educational materials and handouts with cues and reminders from rehab staff. Outcome: Progressing   

## 2020-11-17 NOTE — Progress Notes (Signed)
Physical Therapy Session Note  Patient Details  Name: Kevin Whitehead. MRN: [id number] Date of Birth: 18-Aug-1981  Today's Date: 11/17/2020 PT Individual Time: 1100-1200 PT Individual Time Calculation (min): 60 min   Short Term Goals: Week 1:  PT Short Term Goal 1 (Week 1): STG=LTG due to ELOS PT Short Term Goal 1 - Progress (Week 1): Progressing toward goal Week 2:  PT Short Term Goal 1 (Week 2): STG=LTG due to ELOS  Skilled Therapeutic Interventions/Progress Updates:    pt received in bed and agreeable to therapy. Pt directed in supine>sit mod I and Sit to stand supervision without AD; gait training to day room 400' no AD supervision. Pt then directed in NuStep for 10 mins L8 for increased BLE strengthening and reciprocal movement. Pt directed in airex activities without BUE support min A- CGA for: reciprocal UE and LE shoulder flexion and hip flexion 2x25, hip abduction x25 each with VC throughout for weight shifting and posture as needed. Pt directed in Bosu ball static standing without UE support, min A with brief CGA instances VC for weight shifting to maintain balance, x5 mins min A to get on/off bosu. Pt directed in dynamic sitting on yoga ball with environment setup for safety and min A to achieve position on and off ball for 2x25 alternating shoulder and hip flexion for improved stability and core activation. Pt directed in gait to return to room no AD supervision. Pt directed in pivot to bedside supervision, sit>supine mod I and left in bed, alarm set All needs in reach and in good condition. Call light in hand.     Therapy Documentation Precautions:  Precautions Precautions: Fall,Back Precaution Comments: pt stated 3/3 precautions without cues, minimal cues for adherance during eval Required Braces or Orthoses: Spinal Brace Spinal Brace: Lumbar corset,Applied in sitting position Restrictions Weight Bearing Restrictions: No General:   Vital Signs:   Pain: Pain  Assessment Pain Scale: 0-10 Pain Score: 5  Mobility:   Locomotion :    Trunk/Postural Assessment :    Balance:   Exercises:   Other Treatments:      Therapy/Group: Individual Therapy  Barbaraann Faster 11/17/2020, 12:19 PM
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
## 2020-11-18 ENCOUNTER — Encounter (HOSPITAL_COMMUNITY): Payer: BC Managed Care – PPO | Admitting: Occupational Therapy

## 2020-11-18 DIAGNOSIS — R7401 Elevation of levels of liver transaminase levels: Secondary | ICD-10-CM

## 2020-11-18 DIAGNOSIS — K592 Neurogenic bowel, not elsewhere classified: Secondary | ICD-10-CM

## 2020-11-18 MED ORDER — BETHANECHOL CHLORIDE 25 MG PO TABS
25.0000 mg | ORAL_TABLET | Freq: Three times a day (TID) | ORAL | Status: DC
  Administered 2020-11-18 – 2020-11-22 (×11): 25 mg via ORAL
  Filled 2020-11-18 (×11): qty 1

## 2020-11-18 NOTE — Progress Notes (Signed)
Occupational Therapy Session Note  Patient Details  Name: Kevin Whitehead. MRN: [id number] Date of Birth: 09-Jun-1981  Today's Date: 11/18/2020 OT Group Time: 1100-1200 OT Group Time Calculation (min): 60 min  Skilled Therapeutic Interventions/Progress Updates:    Pt engaged in therapeutic w/c level dance group focusing on patient choice, UE/LE strengthening, salience, activity tolerance, and social participation. Pt was guided through various dance-based exercises involving UEs/LEs and trunk. All music was selected by group members. Emphasis placed on activity tolerance and general strengthening while adhering to back precautions. Pt was mindful of his precautions throughout, helped with song selection and exhibited bright affect. His spouse [name] was also present, participating, and cheerful. At end of session pt returned to his room with [facility].   Therapy Documentation Precautions:  Precautions Precautions: Fall,Back Precaution Comments: pt stated 3/3 precautions without cues, minimal cues for adherance during eval Required Braces or Orthoses: Spinal Brace Spinal Brace: Lumbar corset,Applied in sitting position Restrictions Weight Bearing Restrictions: No Vital Signs: Therapy Vitals Temp: 97.7 F (36.5 C) Pulse Rate: 90 Resp: 16 BP: 102/72 Oxygen Therapy SpO2: 96 % Pain: no s/s pain during tx   ADL: ADL Grooming: Setup Where Assessed-Grooming: Sitting at sink Upper Body Bathing: Supervision/safety Where Assessed-Upper Body Bathing: Sitting at sink Lower Body Bathing: Moderate assistance Where Assessed-Lower Body Bathing: Sitting at sink Upper Body Dressing: Minimal assistance Lower Body Dressing: Moderate assistance Where Assessed-Lower Body Dressing: Sitting at sink,Standing at sink Toileting: Maximal assistance Where Assessed-Toileting: Toilet Toilet Transfer: Furniture conservator/restorer Method: Ambulating      Therapy/Group: Group Therapy  Eland Lamantia A  Dameer Speiser 11/18/2020, 3:44 PM
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
## 2020-11-19 ENCOUNTER — Inpatient Hospital Stay (HOSPITAL_COMMUNITY): Payer: BC Managed Care – PPO | Admitting: Physical Therapy

## 2020-11-19 DIAGNOSIS — R7401 Elevation of levels of liver transaminase levels: Secondary | ICD-10-CM

## 2020-11-19 NOTE — Progress Notes (Signed)
Patient cooperative ad pleasant in good spirits, denies pain, some anxiousness evident po Valium administered per request .Pt bladder scanned for 400 cc and self cath for 500 cc. Patient was able to performed procedure well using sterile technique, staff was available for monitoring technique Continue regime and assisted prn. Call bell and bed alarm on.

## 2020-11-19 NOTE — Progress Notes (Signed)
Physical Therapy Session Note  Patient Details  Name: Kevin K Hecht Jr. MRN: [id number] Date of Birth: 04/30/1981  Today's Date: 11/19/2020 PT Individual Time: 1411-1455 PT Individual Time Calculation (min): 44 min   Short Term Goals: Week 2:  PT Short Term Goal 1 (Week 2): STG=LTG due to ELOS  Skilled Therapeutic Interventions/Progress Updates: Pt presented in bed agreeable to therapy. PT states pain 7/10, premedicated and states is tolerable for therapy. Performed bed mobility with supervision and donned LSO with set up. Pt ambulated to rehab gym supervision without AD transferred to supine on mat supervision. Pt participated in core strengthening activities including hooklying march with TA contraction 2 x 10, modified "dead bug" 2 x 10, bridges with ball squeeze 2x10. Pt noted no increase in pain with activity. Pt then transferred to sitting and ambulated to ortho gym supervision. Participate din NuStep L8 x 10 min maintaining ~60SPM for cardiovascular endurance. Pt returned to room at end of session and left sitting EOB with call bell within reach and current needs met.      Therapy Documentation Precautions:  Precautions Precautions: Fall,Back Precaution Comments: pt stated 3/3 precautions without cues, minimal cues for adherance during eval Required Braces or Orthoses: Spinal Brace Spinal Brace: Lumbar corset,Applied in sitting position Restrictions Weight Bearing Restrictions: No General:   Vital Signs:   Pain:      Therapy/Group: Individual Therapy      , PTA ' 11/19/2020, 3:13 PM  

## 2020-11-19 NOTE — Progress Notes (Signed)
Patient cath himself and wife did the dig stim then suppository and 15 min later she did another dig stim with small BM. We continue to monitor.
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
## 2020-11-20 ENCOUNTER — Inpatient Hospital Stay (HOSPITAL_COMMUNITY): Payer: BC Managed Care – PPO | Admitting: Occupational Therapy

## 2020-11-20 ENCOUNTER — Inpatient Hospital Stay (HOSPITAL_COMMUNITY): Payer: BC Managed Care – PPO

## 2020-11-20 LAB — CBC WITH DIFFERENTIAL/PLATELET
Abs Immature Granulocytes: 0 10*3/uL (ref 0.00–0.07)
Basophils Absolute: 0 10*3/uL (ref 0.0–0.1)
Basophils Relative: 1 %
Eosinophils Absolute: 0.3 10*3/uL (ref 0.0–0.5)
Eosinophils Relative: 7 %
HCT: 32.5 % — ABNORMAL LOW (ref 39.0–52.0)
Hemoglobin: 10.9 g/dL — ABNORMAL LOW (ref 13.0–17.0)
Immature Granulocytes: 0 %
Lymphocytes Relative: 36 %
Lymphs Abs: 1.3 10*3/uL (ref 0.7–4.0)
MCH: 30.5 pg (ref 26.0–34.0)
MCHC: 33.5 g/dL (ref 30.0–36.0)
MCV: 91 fL (ref 80.0–100.0)
Monocytes Absolute: 0.4 10*3/uL (ref 0.1–1.0)
Monocytes Relative: 11 %
Neutro Abs: 1.6 10*3/uL — ABNORMAL LOW (ref 1.7–7.7)
Neutrophils Relative %: 45 %
Platelets: 297 10*3/uL (ref 150–400)
RBC: 3.57 MIL/uL — ABNORMAL LOW (ref 4.22–5.81)
RDW: 12 % (ref 11.5–15.5)
WBC: 3.6 10*3/uL — ABNORMAL LOW (ref 4.0–10.5)
nRBC: 0 % (ref 0.0–0.2)

## 2020-11-20 LAB — COMPREHENSIVE METABOLIC PANEL
ALT: 46 U/L — ABNORMAL HIGH (ref 0–44)
AST: 25 U/L (ref 15–41)
Albumin: 3.1 g/dL — ABNORMAL LOW (ref 3.5–5.0)
Alkaline Phosphatase: 104 U/L (ref 38–126)
Anion gap: 8 (ref 5–15)
BUN: 14 mg/dL (ref 6–20)
CO2: 28 mmol/L (ref 22–32)
Calcium: 8.9 mg/dL (ref 8.9–10.3)
Chloride: 101 mmol/L (ref 98–111)
Creatinine, Ser: 0.7 mg/dL (ref 0.61–1.24)
GFR, Estimated: >60 mL/min (ref >60)
Glucose, Bld: 109 mg/dL — ABNORMAL HIGH (ref 70–99)
Potassium: 3.7 mmol/L (ref 3.5–5.1)
Sodium: 137 mmol/L (ref 135–145)
Total Bilirubin: 0.7 mg/dL (ref 0.3–1.2)
Total Protein: 6.2 g/dL — ABNORMAL LOW (ref 6.5–8.1)

## 2020-11-20 NOTE — Progress Notes (Signed)
Occupational Therapy Session Note  Patient Details  Name: Kevin Whitehead. MRN: [id number] Date of Birth: December 21, 1980  Today's Date: 11/20/2020 OT Individual Time: 1047-1200 OT Individual Time Calculation (min): 73 min    Short Term Goals: Week 1:  OT Short Term Goal 1 (Week 1): STGs = LTGs d/t ELOS OT Short Term Goal 1 - Progress (Week 1): Progressing toward goal Week 2:  OT Short Term Goal 1 (Week 2): STGs = LTGs at upgraded Mod I level   Skilled Therapeutic Interventions/Progress Updates:    Pt greeted at time of session standing at sink with wife in room (who has been checked off) shaving beard at sink, agreeable to OT session and wanting to shower. Pt ambulated to/from bathroom with Supervision no AD and performed TTB transfer, doffed clothing with set up/supervision and performed bathing Mod I from seated level with lateral leans. Discussion/problem solving covering incision at home for showers, PA present part of the way through session to see surgical site and healing process. UB/LB dress set up and able to don brief in adaptive manner in standing. Education provided throughout session regarding sequencing of tasks including when to don/doff clothing, brace, etc. Ambulated to/from gym no AD with distant supervision and performed transfer to mat in same manner. Pt performed multiple hamstring and glute stretches sitting EOM for figure four and in long sitting to improve ability to perform LB ADL. Once back in room, set up with call bell in reach all needs met.   Therapy Documentation Precautions:  Precautions Precautions: Fall,Back Precaution Comments: pt stated 3/3 precautions without cues, minimal cues for adherance during eval Required Braces or Orthoses: Spinal Brace Spinal Brace: Lumbar corset,Applied in sitting position Restrictions Weight Bearing Restrictions: No     Therapy/Group: Individual Therapy  Viona Gilmore 11/20/2020, 12:06 PM
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
## 2020-11-20 NOTE — Progress Notes (Signed)
Patient ID: Kevin Whitehead., male   DOB: January 11, 1981, 40 y.o.   MRN: [id number] Faxed information to [employer] to assist with obtaining cath's in preparation for discharge.

## 2020-11-20 NOTE — Progress Notes (Signed)
Physical Therapy Session Note  Patient Details  Name: Kevin Whitehead. MRN: [id number] Date of Birth: 28-Aug-1981  Today's Date: 11/20/2020 PT Individual Time:Session1: 4098-1191; Session2: 1404-1500 PT Individual Time Calculation (min): 57 min & 56 min  Short Term Goals: Week 2:  PT Short Term Goal 1 (Week 2): STG=LTG due to ELOS  Skilled Therapeutic Interventions/Progress Updates:    Session1: Patient in supine reports stiff from laying in bed.  Had better control with increase on 12 hour pain medication and not needing other forms of medication.  Patient supine to sit I and donned shirt, pants and socks/shoes EOB with S.  Patient S sit to stand. Ambulated without device with S 200'.  Performed car transfer to simulated sedan height S.  Negotiated ramp, curb and uneven terrain over mulch with S no device.  Patient negotiated 12 steps with L rail step to technique with S.  Patient in hooklying for TA activation with hip abduction/adduction, marching, turned to prone for prone heel squeeze with activation of pelvic floor w/ 5 sec hold all x 5 reps.  Patient supine to sit mod I and donned brace I.  Ambulated to ortho gym x 100' with S no AD.  Seated for Nu Step UE/LE level 8 x 8 min keeping steady 60 spm.  Patient ambulated back to room with S and left with wife in the room seated EOB.   Session2:  Patient seated in chair in room with wife present.  Reports disappointment likely not to d/c till Wednesday.  Focus of session on creating HEP for working on strength/stabilization upon d/c home.  Ambulated with S no device to therapy gym x 100'.  Sit<>stand x 5 x 2 sets arms crossed over waist.  Patient reports with feet further back under mat making it easier.  Patient standing at counter for support with blue band around both feet for hip extension x 10 and hip abduction x 10 with cues and CGA for form.  Patient sit to side with S to perform clamshell hip abduction with green t-band x 10 each side.   Supine bridge keeping green band taut between knees, but pt not able to perform without lumbopelvic dissociation so did not add to HEP.  Discussed adding hooklying TA contractions and contractions with knee fallout  As performed first session today in addition to prone heel squeeze.  Issued and reviewed HEP.  Patient performed 12 min on Nu Step UE/LE keeping up to 60 SPM.  Patient ambulated 200' back to room with S.  Left in room seated in chair with wife due to return.    Therapy Documentation Precautions:  Precautions Precautions: Fall,Back Precaution Comments: pt stated 3/3 precautions without cues, minimal cues for adherance during eval Required Braces or Orthoses: Spinal Brace Spinal Brace: Lumbar corset,Applied in sitting position Restrictions Weight Bearing Restrictions: No Pain: Pain Assessment Pain Scale: 0-10 Pain Score: 7  Pain Type: Acute pain Pain Location: Back Pain Orientation: Mid Pain Descriptors / Indicators: Aching Pain Frequency: Intermittent Pain Onset: On-going Patients Stated Pain Goal: 2 Pain Intervention(s): Ambulation/increased activity;Rest    Therapy/Group: Individual Therapy  [name]  Sheran Lawless, PT 11/20/2020, 9:29 AM

## 2020-11-21 ENCOUNTER — Inpatient Hospital Stay (HOSPITAL_COMMUNITY): Payer: BC Managed Care – PPO | Admitting: Occupational Therapy

## 2020-11-21 ENCOUNTER — Inpatient Hospital Stay (HOSPITAL_COMMUNITY): Payer: BC Managed Care – PPO

## 2020-11-21 MED ORDER — NABUMETONE 500 MG PO TABS: 500.0000 mg | ORAL_TABLET | Freq: Two times a day (BID) | ORAL | 0 refills | Status: DC

## 2020-11-21 MED ORDER — DULOXETINE HCL 60 MG PO CPEP: 60.0000 mg | ORAL_CAPSULE | Freq: Every day | ORAL | 3 refills | Status: DC

## 2020-11-21 MED ORDER — BETHANECHOL CHLORIDE 25 MG PO TABS: 25.0000 mg | ORAL_TABLET | Freq: Three times a day (TID) | ORAL | 0 refills | Status: DC

## 2020-11-21 MED ORDER — OXYCODONE HCL ER 20 MG PO T12A: 20.0000 mg | EXTENDED_RELEASE_TABLET | Freq: Two times a day (BID) | ORAL | 0 refills | Status: DC

## 2020-11-21 MED ORDER — ACETAMINOPHEN 325 MG PO TABS: 650.0000 mg | ORAL_TABLET | Freq: Four times a day (QID) | ORAL | Status: AC | PRN

## 2020-11-21 MED ORDER — BUPROPION HCL ER (SR) 100 MG PO TB12: 100.0000 mg | ORAL_TABLET | Freq: Every day | ORAL | 0 refills | Status: DC

## 2020-11-21 MED ORDER — HYDROCORTISONE ACETATE 25 MG RE SUPP: 25.0000 mg | Freq: Two times a day (BID) | RECTAL | 0 refills | Status: DC

## 2020-11-21 MED ORDER — DIAZEPAM 5 MG PO TABS: 5.0000 mg | ORAL_TABLET | Freq: Four times a day (QID) | ORAL | 0 refills | Status: DC | PRN

## 2020-11-21 MED ORDER — BISACODYL 10 MG RE SUPP: 10.0000 mg | Freq: Every day | RECTAL | 0 refills | Status: DC

## 2020-11-21 MED ORDER — TRAMADOL HCL 50 MG PO TABS: 50.0000 mg | ORAL_TABLET | Freq: Four times a day (QID) | ORAL | 0 refills | Status: DC | PRN

## 2020-11-21 MED ORDER — TAMSULOSIN HCL 0.4 MG PO CAPS: 0.8000 mg | ORAL_CAPSULE | Freq: Every day | ORAL | 0 refills | Status: DC

## 2020-11-21 NOTE — Plan of Care (Signed)
  Problem: RH Balance Goal: LTG Patient will maintain dynamic sitting balance (PT) Description: LTG:  Patient will maintain dynamic sitting balance with assistance during mobility activities (PT) Outcome: Completed/Met Goal: LTG Patient will maintain dynamic standing balance (PT) Description: LTG:  Patient will maintain dynamic standing balance with assistance during mobility activities (PT) Outcome: Completed/Met   Problem: Sit to Stand Goal: LTG:  Patient will perform sit to stand with assistance level (PT) Description: LTG:  Patient will perform sit to stand with assistance level (PT) Outcome: Completed/Met   Problem: RH Bed Mobility Goal: LTG Patient will perform bed mobility with assist (PT) Description: LTG: Patient will perform bed mobility with assistance, with/without cues (PT). Outcome: Completed/Met   Problem: RH Bed to Chair Transfers Goal: LTG Patient will perform bed/chair transfers w/assist (PT) Description: LTG: Patient will perform bed to chair transfers with assistance (PT). Outcome: Completed/Met   Problem: RH Car Transfers Goal: LTG Patient will perform car transfers with assist (PT) Description: LTG: Patient will perform car transfers with assistance (PT). Outcome: Completed/Met   Problem: RH Furniture Transfers Goal: LTG Patient will perform furniture transfers w/assist (OT/PT) Description: LTG: Patient will perform furniture transfers  with assistance (OT/PT). Outcome: Completed/Met   Problem: RH Ambulation Goal: LTG Patient will ambulate in controlled environment (PT) Description: LTG: Patient will ambulate in a controlled environment, # of feet with assistance (PT). Outcome: Completed/Met Goal: LTG Patient will ambulate in home environment (PT) Description: LTG: Patient will ambulate in home environment, # of feet with assistance (PT). Outcome: Completed/Met Goal: LTG Patient will ambulate in community environment (PT) Description: LTG: Patient will  ambulate in community environment, # of feet with assistance (PT). Outcome: Adequate for Discharge   Problem: RH Stairs Goal: LTG Patient will ambulate up and down stairs w/assist (PT) Description: LTG: Patient will ambulate up and down # of stairs with assistance (PT) Outcome: Completed/Met  Magda Kiel, PT

## 2020-11-21 NOTE — Patient Care Conference (Signed)
Inpatient RehabilitationTeam Conference and Plan of Care Update Date: 11/21/2020   Time: 11:28 AM    Patient Name: Kevin Whitehead.      Medical Record Number: [id number]  Date of Birth: 12/29/1980 Sex: Male         Room/Bed: 4M06C/4M06C-01 Payor Info: Payor: BLUE CROSS BLUE [employer]IELD / Plan: BCBS COMM PPO / Product Type: *No Product type* /    Admit Date/Time:  11/08/2020  2:31 PM  Primary Diagnosis:  Cauda equina compression [facility])  Hospital Problems: Principal Problem:   Cauda equina compression (HCC) Active Problems:   Acute blood loss anemia   Hypoalbuminemia due to protein-calorie malnutrition (HCC)   Neurogenic bladder   Neuropathic pain   Postoperative pain   Neurogenic bowel   Transaminitis    Expected Discharge Date: Expected Discharge Date: 11/22/20  Team Members Present: Physician leading conference: Dr. Genice Rouge Care Coodinator Present: Chana Bode, RN, BSN, CRRN Nurse Present: Despina Hidden, RN PT Present: Sheran Lawless, PT OT Present: Earleen Newport, OT PPS Coordinator present : Fae Pippin, [place], PT     Current Status/Progress Goal Weekly Team Focus  Bowel/Bladder   Continue Bowel Program in place, [facility] 11/21/19, Proforming self  I/O catherizations- and wife is successfully performing dig stimulations per orders and standards  improve bowel and bladder continence  Assess and educate patient tand wife on Bowel Program   Swallow/Nutrition/ Hydration             ADL's   Mod I overall with bathing/dressing, assist for toileting from wife for bowel program and hygiene when needed  upgraded Mod I  endurance/tolerance, dynamic standing balance, core/back strength, hygiene techniques for toilet tasks   Mobility   S overall no device, mod I bed mobility  Indep overall  pelvic floor and core strength, HEP instruction, functional activities with back precautions.   Communication             Safety/Cognition/ Behavioral Observations             Pain   Pain scsale 7-8/.10 with schedule medication Q 12 hours well controlled states patient  Mainatin a pain goal under 3  QS/PRN assess pain with followup   Skin               Discharge Planning:  Pt and wife have been educated on cath and bowel program. Pt doing well and will have home health for a few visits to reniforce training   Team Discussion: Wife doing bowel program and patient able to complete self catheterizations. Nerve pain treated per MD. Discussed level of injury and end diagnosis.  Patient on target to meet rehab goals: yes, currently MOD I with ADLs and able to maintain back precautions with activity. MOD I for transfers and ambuating without an assistive device with a rigid posture that relaxes over time.  *See Care Plan and progress notes for long and short-term goals.   Revisions to Treatment Plan:   Teaching Needs: Transfers, toileting, bowel and bladder program/management, medications, incision care, etc.  Current Barriers to Discharge: Decreased caregiver support, Home enviroment access/layout and insurance for [facility] services and equipment  Possible Resolutions to Barriers: Home exercise program Family education     Medical Summary Current Status: i/o caths q4-6 hours- doing on his own; wife doing bowel program- no accidents lately; - back incision- dry dressing;  Barriers to Discharge: Home enviroment access/layout;Neurogenic Bowel & Bladder;Incontinence;Weight bearing restrictions;Wound care  Barriers to Discharge Comments: H/H nursing and  H/H PT/OT- will need caths to send home- will check on catheters to go home- is mod I at home- follows back precautions Possible Resolutions to [employer] Focus: occ tight muscles- hamstrings, etc. working on stretching- mod I in room; no device; rigid posture- improves over time walking; d/c 1/5-   Continued Need for Acute Rehabilitation Level of Care: The patient requires daily medical management by a physician  with specialized training in physical medicine and rehabilitation for the following reasons: Direction of a multidisciplinary physical rehabilitation program to maximize functional independence : Yes Medical management of patient stability for increased activity during participation in an intensive rehabilitation regime.: Yes Analysis of laboratory values and/or radiology reports with any subsequent need for medication adjustment and/or medical intervention. : Yes   I attest that I was present, lead the team conference, and concur with the assessment and plan of the team.   Chana Bode B 11/21/2020, 4:29 PM

## 2020-11-21 NOTE — Progress Notes (Signed)
Physical Therapy Session Note  Patient Details  Name: Kevin Whitehead. MRN: [id number] Date of Birth: Jun 12, 1981  Today's Date: 11/21/2020 PT Individual Time: 7262-0355 PT Individual Time Calculation (min): 72 min   Short Term Goals: Week 1:  PT Short Term Goal 1 (Week 1): STG=LTG due to ELOS PT Short Term Goal 1 - Progress (Week 1): Progressing toward goal Week 2:  PT Short Term Goal 1 (Week 2): STG=LTG due to ELOS Week 3:     Skilled Therapeutic Interventions/Progress Updates:    PAIN mild back pain w/bridging, limited range to prevent pain.  Treatment to tolerance.   Sit to stand and gait x >24ft w/supervision.  Functional gait trials - challenges include Carrying 6lb wt, changing hands every 42ft Carrying 6lb basket w/two hands Sudden stops and starts carrying basket Changing gait speeds Pivot turns - all performed w/supervision only,no balance loss.  Pelvic floor elevator exercise for concentric/ecctentric activation using manual resistance to hip adduction and glut squeeze/performed in sitting for gravity resisted.  4 sets of 3  Mat therex: Supine pelvic tilt x 15 Pelvic tilt w/marching x 15 Bridge w/feet on med ball x 10 Quadruped leg lifts x 10, tactile cues for lumbar stabilization Quadruped arm raises x 16  Gait 141ft mod I to room.  Pt left seated at edge of bed w/wife at bedside.   Therapy Documentation Precautions:  Precautions Precautions: Back Precaution Comments: pt stated 3/3 precautions without cues, minimal cues for adherance during eval Required Braces or Orthoses: Spinal Brace Spinal Brace: Lumbar corset,Applied in sitting position Restrictions Weight Bearing Restrictions: No    Therapy/Group: Individual Therapy  Rada Hay, PT   Shearon Balo 11/21/2020, 3:54 PM

## 2020-11-21 NOTE — Discharge Instructions (Signed)
Inpatient Rehab Discharge Instructions  Kevin Whitehead. Discharge date and time: No discharge date for patient encounter.   Activities/Precautions/ Functional Status: Activity: Back brace when out of bed Diet: regular diet Wound Care: Routine skin checks Functional status:  ___ No restrictions     ___ Walk up steps independently ___ 24/7 supervision/assistance   ___ Walk up steps with assistance ___ Intermittent supervision/assistance  ___ Bathe/dress independently ___ Walk with walker     _x__ Bathe/dress with assistance ___ Walk Independently    ___ Shower independently ___ Walk with assistance    ___ Shower with assistance ___ No alcohol     ___ Return to work/school ________  Special Instructions: No driving smoking or alcohol  Continue in and out bladder catheterizations as directed   COMMUNITY REFERRALS UPON DISCHARGE:    Home Health:   PT & RN                   Agency:[facility] Phone:[phone]    Medical Equipment/Items Ordered:3 IN 1                                                 Agency/Supplier:[facility]  [phone]    My questions have been answered and I understand these instructions. I will adhere to these goals and the provided educational materials after my discharge from the hospital.  Patient/Caregiver Signature _______________________________ Date __________  Clinician Signature _______________________________________ Date __________  Please bring this form and your medication list with you to all your follow-up doctor's appointments.

## 2020-11-21 NOTE — Progress Notes (Signed)
Patient ID: Kevin Whitehead., male   DOB: 04-30-1981, 40 y.o.   MRN: [id number] OT requesting 3 in 1, have ordered via [employer] for delivery to pt's room prior to discharge tomorrow. Aware of team conference and feels prepared for discharge tomorrow. Pt and wife have been educated on his care needs and bladder and bowel programs.

## 2020-11-21 NOTE — Progress Notes (Incomplete)
Patient continue to improve slowly, oriented and cooperative,continueferdrm self

## 2020-11-21 NOTE — Progress Notes (Signed)
Inpatient Rehabilitation Care Coordinator Discharge Note  The overall goal for the admission was met for:   Discharge location: [place] 40 YO DAUGHTER  Length of Stay: Yes-14 DAYS  Discharge activity level: Yes-MOD/I LEVEL  Home/community participation: Yes  Services provided included: MD, RD, PT, OT, RN, CM, TR, Pharmacy, Neuropsych and SW  Financial Services: Private Insurance: [employer] offered to/list presented to:YES  Follow-up services arranged: Home Health: [place], DME: [facility]-3 IN 1 and Patient/Family has no preference for HH/DME agencies  Comments (or additional information):WIFE [place]  Patient/Family verbalized understanding of follow-up arrangements: Yes  Individual responsible for coordination of the follow-up plan: SELF [phone]   Confirmed correct DME delivered: Elease Hashimoto 11/21/2020    Kristeena Meineke, [place]

## 2020-11-21 NOTE — Progress Notes (Signed)
Physical Therapy Discharge Summary  Patient Details  Name: Kevin Whitehead. MRN: [id number] Date of Birth: 1980-11-20  Today's Date: 11/21/2020 PT Individual Time: 0835-0930 PT Individual Time Calculation (min): 55 min    Patient has met 10 of 11 long term goals due to improved activity tolerance, improved balance, increased strength, decreased pain and ability to compensate for deficits.  Patient to discharge at an ambulatory level Independent.   Patient's care partner is independent to provide the necessary wife independent providing supervision in hospital setting, pt independent with mobility  assistance at discharge.  Reasons goals not met: Did not perform community mobility due to weather.  Recommendation:  Patient will benefit from ongoing skilled PT services in home health setting to continue to advance safe functional mobility, address ongoing impairments in R LE strength, sensory deficits in perineal area, pain, and minimize fall risk.  Equipment: No equipment provided  Reasons for discharge: treatment goals met and discharge from hospital  Patient/family agrees with progress made and goals achieved: Yes   Skilled Therapeutic Interventions/Progress Updates:    Patient in supine with wife present.  Supine to sit independent.  Performed sit to/from stand transfers throughout session independent.  Ambulated throughout session independent without assistive device up to 200'.  Patient performed car transfer with S to simulated sedan height.  Negotiated ramp, curb and mulch without device independently, but discussed if needing hand hold for curb step options to use in community.  Patient negotiated 12 steps with 1 rail with step to technique mod independent.  In parallel bars performed side stepping with cross overs then cross behind, then forward tandem gait.  Standing balance with tandem stand x 30 sec, with eyes closed and feet together 30 seconds.  Practice picking up object from  floor using 1 hand hold on chair and down into half kneeling for preserving back precautions.  Performed safely after demonstration.  Patient on Nu Step x 6 minutes 6 SPM at level 8.  Returned to room and left with wife in the room with pt made independent in the room.  PT Discharge Precautions/Restrictions   Precautions Precautions: Back Precaution Comments: pt stated 3/3 precautions without cues, minimal cues for adherance during eval Required Braces or Orthoses: Spinal Brace Spinal Brace: Lumbar corset,Applied in sitting position Restrictions Weight Bearing Restrictions: No Pain   Pain Assessment Pain Scale: 0-10 Pain Score: 7  Pain Type: Acute pain Pain Location: Back Pain Orientation: Mid Pain Descriptors / Indicators: Aching Pain Frequency: Intermittent Pain Onset: On-going Patients Stated Pain Goal: 2 Pain Intervention(s): Repositioned;Ambulation/increased activity Vision/Perception     Cognition Overall Cognitive Status: Within Functional Limits for tasks assessed Arousal/Alertness: Awake/alert Attention: Alternating Safety/Judgment: Appears intact Sensation Sensation Light Touch: Impaired Detail Light Touch Impaired Details: Impaired LLE Hot/Cold: Not tested Proprioception: Appears Intact Stereognosis: Not tested Additional Comments: Reports improved sensation R LE but still not equal to L, still absent with saddle paresthesia on bowel program Coordination Gross Motor Movements are Fluid and Coordinated: Yes Fine Motor Movements are Fluid and Coordinated: Yes Motor  Motor Motor: Within Functional Limits Motor - Discharge Observations: still somewhat rigid when stiff intially moving but improved from eval  Mobility   Bed Mobility Rolling Right: Independent Supine to Sit: Independent Sit to Supine: Independent Transfers Transfers: Sit to Stand;Stand to Sit Sit to Stand: Independent Stand to Sit: Independent Stand Pivot Transfers: Independent Transfer  (Assistive device): None Locomotion  Gait Ambulation: Yes Gait Assistance: Independent Gait Distance (Feet): 200 Feet Assistive device: None  Gait Gait: Yes Gait Pattern: Impaired Gait Pattern: Step-through pattern;Decreased stride length;Wide base of support;Decreased dorsiflexion - right Stairs / Additional Locomotion Stairs: Yes Stairs Assistance: Independent with assistive device Stair Management Technique: One rail Left Number of Stairs: 12 Height of Stairs: 6 Ramp: Independent Curb: Independent with assistive device Wheelchair Mobility Wheelchair Mobility: No  Trunk/Postural Assessment  Cervical Assessment Cervical Assessment: Within Functional Limits Thoracic Assessment Thoracic Assessment: Within Functional Limits Lumbar Assessment Lumbar Assessment: Exceptions to [facility] (stiffness with spinal brace) Postural Control Postural Control: Within Functional Limits  Balance Balance Balance Assessed: Yes Static Sitting Balance Static Sitting - Level of Assistance: 7: Independent Dynamic Sitting Balance Dynamic Sitting - Balance Support: No upper extremity supported;Feet supported Dynamic Sitting - Level of Assistance: 7: Independent Static Standing Balance Static Standing - Level of Assistance: 7: Independent Static Stance: Eyes closed Static Stance: Eyes Closed: 30 sec feet together Dynamic Standing Balance Dynamic Standing - Balance Support: No upper extremity supported Dynamic Standing - Level of Assistance: 7: Independent Dynamic Standing - Balance Activities: Lateral lean/weight shifting;Reaching for objects Extremity Assessment      RLE Assessment RLE Assessment: Exceptions to [facility] Active Range of Motion (AROM) Comments: ankle limitation due to history of fusion General Strength Comments: grossly 4+/5 LLE Assessment LLE Assessment: Within Functional Limits General Strength Comments: grossly 5/5    Jamison Oka, PT 11/21/2020, 5:33 PM

## 2020-11-21 NOTE — Progress Notes (Signed)
Occupational Therapy Discharge Summary  Patient Details  Name: Kevin Whitehead. MRN: [id number] Date of Birth: 11-24-1980  Today's Date: 11/21/2020 OT Individual Time: 4332-9518 OT Individual Time Calculation (min): 57 min    Patient has met 9 of 9 long term goals due to improved activity tolerance, improved balance, postural control, ability to compensate for deficits and improved coordination.  Patient to discharge at overall Independent level.  Patient's care partner is independent to provide the necessary physical assistance at discharge.  Pt is Indep with ADLs including bathing at shower level with use of LHS and shower seat and UB/LB dressing including back brace, able to perform functional mobility in room for item retrieval and self care tasks Independent as well. Supervision for toilet tasks as he occasionally needs help with brief while maintaining back precautions and assist with bowel program from wife Morey Hummingbird. Note wife has been trained/present for ADL sessions to help if needed.   Reasons goals not met: NA  Recommendation:  Patient will benefit from ongoing skilled OT services in home health setting to continue to advance functional skills in the area of BADL and Reduce care partner burden.  Equipment: BSC (has shower seat at home)  Reasons for discharge: treatment goals met and discharge from hospital  Patient/family agrees with progress made and goals achieved: Yes    Skilled Intervention: Pt greeted at time of session standing in room with wife present, made independent in room today, and excited about going home. Focus of session on any last minute questions regarding going home, pt did not have any. Discussed covering back incision for showers, having shower seat in walk in shower, and item retrieval for self care tasks. Pt walked room > ADL apartment > gym > back to room Independent with no AD and back brace remained on throughout session. In ADL apartment, reviewed  recommendations and tips to provide easy access for fridge, cabinets, etc. And to prevent bending for food/item retrieval and pt receptive. In gym, reviewed 1x20 BUE strengthening exercises with 5# dumbbells for pt to use at home (encouraged not to use >10-15#) to maintain UB strength. Also focused on HEP for stretching program for hamstrings in long sitting encouraging knees touching mat, 10 second holds, and modified figure four. Encouraged to perform to decrease hamstring and glute tightness. Ambulated back to room Indep, all needs met and no questions for DC.   OT Discharge Precautions/Restrictions  Precautions Precautions: Back Required Braces or Orthoses: Spinal Brace Spinal Brace: Lumbar corset;Applied in sitting position Restrictions Weight Bearing Restrictions: No Pain Pain Assessment Pain Scale: 0-10 Pain Score: 7  Pain Type: Acute pain Pain Location: Back Pain Descriptors / Indicators: Aching Pain Onset: On-going Pain Intervention(s): Repositioned;Ambulation/increased activity ADL ADL Equipment Provided: Reacher,Long-handled sponge Eating: Independent Grooming: Modified independent Where Assessed-Grooming: Standing at sink Upper Body Bathing: Modified independent Where Assessed-Upper Body Bathing: Shower Lower Body Bathing: Modified independent Where Assessed-Lower Body Bathing: Shower Upper Body Dressing: Modified independent (Device) Lower Body Dressing: Modified independent Where Assessed-Lower Body Dressing: Sitting at sink,Standing at sink Toileting: Supervision/safety Where Assessed-Toileting: Toilet,Bedside Commode Toilet Transfer: Modified independent Armed forces technical officer Method: Counselling psychologist: Radiographer, therapeutic: Modified independent Clinical cytogeneticist Method: Ambulating Vision Baseline Vision/History: No visual deficits Patient Visual Report: No change from baseline Vision Assessment?: No apparent visual  deficits Perception  Perception: Within Functional Limits Praxis Praxis: Intact Cognition Overall Cognitive Status: Within Functional Limits for tasks assessed Arousal/Alertness: Awake/alert Orientation Level: Oriented X4 Attention: Alternating Memory: Appears intact Awareness:  Appears intact Problem Solving: Appears intact Safety/Judgment: Appears intact Sensation Sensation Light Touch: Impaired Detail Light Touch Impaired Details: Impaired LLE Hot/Cold: Not tested Proprioception: Appears Intact Stereognosis: Not tested Additional Comments: decreased sensation with bowel program but wife assisting Coordination Gross Motor Movements are Fluid and Coordinated: Yes Fine Motor Movements are Fluid and Coordinated: Yes Motor  Motor Motor: Within Functional Limits Motor - Discharge Observations: still somewhat rigid when stiff intially moving but improved from eval Mobility  Bed Mobility Rolling Right: Independent Supine to Sit: Independent Sit to Supine: Independent Transfers Sit to Stand: Independent Stand to Sit: Independent  Trunk/Postural Assessment  Cervical Assessment Cervical Assessment: Within Functional Limits Thoracic Assessment Thoracic Assessment: Within Functional Limits Lumbar Assessment Lumbar Assessment: Exceptions to [facility] (some stiffness and rigidity, spinal brace in place) Postural Control Postural Control: Within Functional Limits  Balance Balance Balance Assessed: Yes Static Sitting Balance Static Sitting - Balance Support: Feet supported;No upper extremity supported Static Sitting - Level of Assistance: 7: Independent Dynamic Sitting Balance Dynamic Sitting - Balance Support: No upper extremity supported;Feet supported Dynamic Sitting - Level of Assistance: 7: Independent Dynamic Sitting - Balance Activities: Lateral lean/weight shifting;Forward lean/weight shifting;Reaching for objects Static Standing Balance Static Standing - Balance Support:  No upper extremity supported Static Standing - Level of Assistance: 7: Independent Static Stance: Eyes closed Static Stance: Eyes Closed: 30 sec feet together Dynamic Standing Balance Dynamic Standing - Balance Support: No upper extremity supported;During functional activity Dynamic Standing - Level of Assistance: 7: Independent Dynamic Standing - Balance Activities: Lateral lean/weight shifting;Reaching for objects;Forward lean/weight shifting Extremity/Trunk Assessment RUE Assessment RUE Assessment: Within Functional Limits LUE Assessment LUE Assessment: Within Functional Limits   Viona Gilmore 11/21/2020, 1:01 PM
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
## 2020-11-22 MED ORDER — OXYCODONE HCL 10 MG PO TABS: 10.0000 mg | ORAL_TABLET | ORAL | 0 refills | Status: AC | PRN

## 2020-11-22 MED ORDER — OXYCODONE HCL 5 MG PO TABS: 10.0000 mg | ORAL_TABLET | ORAL | Status: DC | PRN
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
## 2020-11-22 NOTE — Plan of Care (Signed)
Patient met all goal

## 2020-11-22 NOTE — Progress Notes (Signed)
Prior authorization needed for patient's OxyContin after long discussion with patient and he was changed to oxycodone immediate release as needed he was quite satisfied with this contact made to his pharmacy on the necessary changes

## 2020-11-24 DIAGNOSIS — N319 Neuromuscular dysfunction of bladder, unspecified: Secondary | ICD-10-CM | POA: Diagnosis not present

## 2020-11-25 DIAGNOSIS — M48061 Spinal stenosis, lumbar region without neurogenic claudication: Secondary | ICD-10-CM | POA: Diagnosis not present

## 2020-11-25 DIAGNOSIS — Z9181 History of falling: Secondary | ICD-10-CM | POA: Diagnosis not present

## 2020-11-25 DIAGNOSIS — Z981 Arthrodesis status: Secondary | ICD-10-CM | POA: Diagnosis not present

## 2020-11-25 DIAGNOSIS — K5900 Constipation, unspecified: Secondary | ICD-10-CM | POA: Diagnosis not present

## 2020-11-25 DIAGNOSIS — F901 Attention-deficit hyperactivity disorder, predominantly hyperactive type: Secondary | ICD-10-CM | POA: Diagnosis not present

## 2020-11-25 DIAGNOSIS — E46 Unspecified protein-calorie malnutrition: Secondary | ICD-10-CM | POA: Diagnosis not present

## 2020-11-25 DIAGNOSIS — Z87891 Personal history of nicotine dependence: Secondary | ICD-10-CM | POA: Diagnosis not present

## 2020-11-25 DIAGNOSIS — G8929 Other chronic pain: Secondary | ICD-10-CM | POA: Diagnosis not present

## 2020-11-25 DIAGNOSIS — D62 Acute posthemorrhagic anemia: Secondary | ICD-10-CM | POA: Diagnosis not present

## 2020-11-25 DIAGNOSIS — N319 Neuromuscular dysfunction of bladder, unspecified: Secondary | ICD-10-CM | POA: Diagnosis not present

## 2020-11-25 DIAGNOSIS — S32011D Stable burst fracture of first lumbar vertebra, subsequent encounter for fracture with routine healing: Secondary | ICD-10-CM | POA: Diagnosis not present

## 2020-11-27 ENCOUNTER — Telehealth: Payer: Self-pay

## 2020-11-27 DIAGNOSIS — F901 Attention-deficit hyperactivity disorder, predominantly hyperactive type: Secondary | ICD-10-CM | POA: Diagnosis not present

## 2020-11-27 DIAGNOSIS — N319 Neuromuscular dysfunction of bladder, unspecified: Secondary | ICD-10-CM | POA: Diagnosis not present

## 2020-11-27 DIAGNOSIS — M48061 Spinal stenosis, lumbar region without neurogenic claudication: Secondary | ICD-10-CM | POA: Diagnosis not present

## 2020-11-27 DIAGNOSIS — Z981 Arthrodesis status: Secondary | ICD-10-CM | POA: Diagnosis not present

## 2020-11-27 DIAGNOSIS — E46 Unspecified protein-calorie malnutrition: Secondary | ICD-10-CM | POA: Diagnosis not present

## 2020-11-27 DIAGNOSIS — K5900 Constipation, unspecified: Secondary | ICD-10-CM | POA: Diagnosis not present

## 2020-11-27 DIAGNOSIS — S32011D Stable burst fracture of first lumbar vertebra, subsequent encounter for fracture with routine healing: Secondary | ICD-10-CM | POA: Diagnosis not present

## 2020-11-27 DIAGNOSIS — Z9181 History of falling: Secondary | ICD-10-CM | POA: Diagnosis not present

## 2020-11-27 DIAGNOSIS — D62 Acute posthemorrhagic anemia: Secondary | ICD-10-CM | POA: Diagnosis not present

## 2020-11-27 DIAGNOSIS — Z87891 Personal history of nicotine dependence: Secondary | ICD-10-CM | POA: Diagnosis not present

## 2020-11-27 DIAGNOSIS — G8929 Other chronic pain: Secondary | ICD-10-CM | POA: Diagnosis not present

## 2020-11-27 NOTE — Telephone Encounter (Signed)
Gave verbal order for skilled nurse to [facility] nurse [phone]) Pt will call to make appt for Hemorid meds

## 2020-11-28 DIAGNOSIS — S32012A Unstable burst fracture of first lumbar vertebra, initial encounter for closed fracture: Secondary | ICD-10-CM | POA: Insufficient documentation

## 2020-11-29 DIAGNOSIS — R339 Retention of urine, unspecified: Secondary | ICD-10-CM | POA: Diagnosis not present

## 2020-11-30 DIAGNOSIS — Z9181 History of falling: Secondary | ICD-10-CM | POA: Diagnosis not present

## 2020-11-30 DIAGNOSIS — Z981 Arthrodesis status: Secondary | ICD-10-CM | POA: Diagnosis not present

## 2020-11-30 DIAGNOSIS — Z87891 Personal history of nicotine dependence: Secondary | ICD-10-CM | POA: Diagnosis not present

## 2020-11-30 DIAGNOSIS — K5900 Constipation, unspecified: Secondary | ICD-10-CM | POA: Diagnosis not present

## 2020-11-30 DIAGNOSIS — D62 Acute posthemorrhagic anemia: Secondary | ICD-10-CM | POA: Diagnosis not present

## 2020-11-30 DIAGNOSIS — N319 Neuromuscular dysfunction of bladder, unspecified: Secondary | ICD-10-CM | POA: Diagnosis not present

## 2020-11-30 DIAGNOSIS — M48061 Spinal stenosis, lumbar region without neurogenic claudication: Secondary | ICD-10-CM | POA: Diagnosis not present

## 2020-11-30 DIAGNOSIS — F901 Attention-deficit hyperactivity disorder, predominantly hyperactive type: Secondary | ICD-10-CM | POA: Diagnosis not present

## 2020-11-30 DIAGNOSIS — S32011D Stable burst fracture of first lumbar vertebra, subsequent encounter for fracture with routine healing: Secondary | ICD-10-CM | POA: Diagnosis not present

## 2020-11-30 DIAGNOSIS — G8929 Other chronic pain: Secondary | ICD-10-CM | POA: Diagnosis not present

## 2020-11-30 DIAGNOSIS — E46 Unspecified protein-calorie malnutrition: Secondary | ICD-10-CM | POA: Diagnosis not present

## 2020-12-05 DIAGNOSIS — K5900 Constipation, unspecified: Secondary | ICD-10-CM | POA: Diagnosis not present

## 2020-12-05 DIAGNOSIS — M48061 Spinal stenosis, lumbar region without neurogenic claudication: Secondary | ICD-10-CM | POA: Diagnosis not present

## 2020-12-05 DIAGNOSIS — G8929 Other chronic pain: Secondary | ICD-10-CM | POA: Diagnosis not present

## 2020-12-05 DIAGNOSIS — Z87891 Personal history of nicotine dependence: Secondary | ICD-10-CM | POA: Diagnosis not present

## 2020-12-05 DIAGNOSIS — D62 Acute posthemorrhagic anemia: Secondary | ICD-10-CM | POA: Diagnosis not present

## 2020-12-05 DIAGNOSIS — N319 Neuromuscular dysfunction of bladder, unspecified: Secondary | ICD-10-CM | POA: Diagnosis not present

## 2020-12-05 DIAGNOSIS — F901 Attention-deficit hyperactivity disorder, predominantly hyperactive type: Secondary | ICD-10-CM | POA: Diagnosis not present

## 2020-12-05 DIAGNOSIS — Z981 Arthrodesis status: Secondary | ICD-10-CM | POA: Diagnosis not present

## 2020-12-05 DIAGNOSIS — E46 Unspecified protein-calorie malnutrition: Secondary | ICD-10-CM | POA: Diagnosis not present

## 2020-12-05 DIAGNOSIS — S32011D Stable burst fracture of first lumbar vertebra, subsequent encounter for fracture with routine healing: Secondary | ICD-10-CM | POA: Diagnosis not present

## 2020-12-05 DIAGNOSIS — Z9181 History of falling: Secondary | ICD-10-CM | POA: Diagnosis not present

## 2020-12-06 ENCOUNTER — Telehealth: Payer: Self-pay

## 2020-12-06 ENCOUNTER — Other Ambulatory Visit: Payer: Self-pay

## 2020-12-06 ENCOUNTER — Encounter: Payer: Self-pay | Admitting: Registered Nurse

## 2020-12-06 ENCOUNTER — Encounter: Payer: BC Managed Care – PPO | Attending: Registered Nurse | Admitting: Registered Nurse

## 2020-12-06 VITALS — BP 119/76 | HR 89 | Temp 97.9°F | Ht 72.0 in | Wt 184.0 lb

## 2020-12-06 DIAGNOSIS — G8918 Other acute postprocedural pain: Secondary | ICD-10-CM | POA: Insufficient documentation

## 2020-12-06 DIAGNOSIS — G834 Cauda equina syndrome: Secondary | ICD-10-CM | POA: Diagnosis not present

## 2020-12-06 DIAGNOSIS — K592 Neurogenic bowel, not elsewhere classified: Secondary | ICD-10-CM | POA: Diagnosis not present

## 2020-12-06 DIAGNOSIS — N319 Neuromuscular dysfunction of bladder, unspecified: Secondary | ICD-10-CM

## 2020-12-06 DIAGNOSIS — M62838 Other muscle spasm: Secondary | ICD-10-CM | POA: Insufficient documentation

## 2020-12-06 MED ORDER — DIAZEPAM 5 MG PO TABS: 5.0000 mg | ORAL_TABLET | Freq: Every evening | ORAL | 0 refills | Status: DC | PRN

## 2020-12-06 MED ORDER — HYDROCORTISONE ACETATE 25 MG RE SUPP: 25.0000 mg | Freq: Two times a day (BID) | RECTAL | 0 refills | Status: DC

## 2020-12-06 MED ORDER — TAMSULOSIN HCL 0.4 MG PO CAPS: 0.8000 mg | ORAL_CAPSULE | Freq: Every day | ORAL | 0 refills | Status: DC

## 2020-12-06 NOTE — Telephone Encounter (Signed)
Left message and asked pt to call back and reschedule appointment 12/08/20 due to weather. Kevin Whitehead  
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
## 2020-12-07 ENCOUNTER — Ambulatory Visit: Payer: BC Managed Care – PPO | Admitting: Hospice and Palliative Medicine

## 2020-12-07 ENCOUNTER — Encounter: Payer: Self-pay | Admitting: Hospice and Palliative Medicine

## 2020-12-07 VITALS — BP 107/69 | HR 91 | Temp 97.9°F | Resp 16 | Ht 72.0 in | Wt 187.0 lb

## 2020-12-07 DIAGNOSIS — G4719 Other hypersomnia: Secondary | ICD-10-CM | POA: Diagnosis not present

## 2020-12-07 DIAGNOSIS — G834 Cauda equina syndrome: Secondary | ICD-10-CM | POA: Diagnosis not present

## 2020-12-07 DIAGNOSIS — R41840 Attention and concentration deficit: Secondary | ICD-10-CM

## 2020-12-07 DIAGNOSIS — Z79899 Other long term (current) drug therapy: Secondary | ICD-10-CM

## 2020-12-07 MED ORDER — BUPROPION HCL ER (SR) 100 MG PO TB12: 100.0000 mg | ORAL_TABLET | Freq: Every day | ORAL | 0 refills | Status: DC

## 2020-12-07 MED ORDER — AMPHETAMINE-DEXTROAMPHETAMINE 20 MG PO TABS: 20.0000 mg | ORAL_TABLET | Freq: Two times a day (BID) | ORAL | 0 refills | Status: DC

## 2020-12-07 MED ORDER — AMPHETAMINE-DEXTROAMPHETAMINE 20 MG PO TABS
20.0000 mg | ORAL_TABLET | Freq: Two times a day (BID) | ORAL | 0 refills | Status: DC | PRN
Start: 2020-12-07 — End: 2021-02-07
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
## 2020-12-08 ENCOUNTER — Encounter: Payer: BLUE CROSS/BLUE SHIELD | Admitting: Internal Medicine

## 2020-12-08 DIAGNOSIS — N312 Flaccid neuropathic bladder, not elsewhere classified: Secondary | ICD-10-CM | POA: Diagnosis not present

## 2020-12-08 DIAGNOSIS — N5201 Erectile dysfunction due to arterial insufficiency: Secondary | ICD-10-CM | POA: Diagnosis not present

## 2020-12-12 DIAGNOSIS — S32012A Unstable burst fracture of first lumbar vertebra, initial encounter for closed fracture: Secondary | ICD-10-CM | POA: Diagnosis not present

## 2020-12-13 ENCOUNTER — Encounter: Payer: Self-pay | Admitting: Hospice and Palliative Medicine

## 2020-12-14 DIAGNOSIS — R339 Retention of urine, unspecified: Secondary | ICD-10-CM | POA: Diagnosis not present

## 2020-12-15 DIAGNOSIS — Z9181 History of falling: Secondary | ICD-10-CM

## 2020-12-15 DIAGNOSIS — S32011D Stable burst fracture of first lumbar vertebra, subsequent encounter for fracture with routine healing: Secondary | ICD-10-CM | POA: Diagnosis not present

## 2020-12-15 DIAGNOSIS — E46 Unspecified protein-calorie malnutrition: Secondary | ICD-10-CM

## 2020-12-15 DIAGNOSIS — K5900 Constipation, unspecified: Secondary | ICD-10-CM

## 2020-12-15 DIAGNOSIS — Z87891 Personal history of nicotine dependence: Secondary | ICD-10-CM

## 2020-12-15 DIAGNOSIS — M48061 Spinal stenosis, lumbar region without neurogenic claudication: Secondary | ICD-10-CM | POA: Diagnosis not present

## 2020-12-15 DIAGNOSIS — Z981 Arthrodesis status: Secondary | ICD-10-CM

## 2020-12-15 DIAGNOSIS — D62 Acute posthemorrhagic anemia: Secondary | ICD-10-CM | POA: Diagnosis not present

## 2020-12-15 DIAGNOSIS — N319 Neuromuscular dysfunction of bladder, unspecified: Secondary | ICD-10-CM

## 2020-12-15 DIAGNOSIS — F901 Attention-deficit hyperactivity disorder, predominantly hyperactive type: Secondary | ICD-10-CM

## 2020-12-15 DIAGNOSIS — G8929 Other chronic pain: Secondary | ICD-10-CM | POA: Diagnosis not present

## 2020-12-21 ENCOUNTER — Encounter: Payer: BC Managed Care – PPO | Admitting: Internal Medicine

## 2020-12-29 ENCOUNTER — Other Ambulatory Visit: Payer: Self-pay | Admitting: Registered Nurse

## 2021-01-02 ENCOUNTER — Other Ambulatory Visit: Payer: Self-pay | Admitting: Registered Nurse

## 2021-01-08 ENCOUNTER — Encounter: Payer: BC Managed Care – PPO | Admitting: Physical Medicine and Rehabilitation

## 2021-01-10 DIAGNOSIS — N319 Neuromuscular dysfunction of bladder, unspecified: Secondary | ICD-10-CM | POA: Diagnosis not present

## 2021-01-10 DIAGNOSIS — S32012A Unstable burst fracture of first lumbar vertebra, initial encounter for closed fracture: Secondary | ICD-10-CM | POA: Diagnosis not present

## 2021-01-15 DIAGNOSIS — R339 Retention of urine, unspecified: Secondary | ICD-10-CM | POA: Diagnosis not present

## 2021-01-26 ENCOUNTER — Encounter: Payer: BC Managed Care – PPO | Admitting: Physical Medicine and Rehabilitation

## 2021-02-06 ENCOUNTER — Encounter: Payer: BC Managed Care – PPO | Admitting: Hospice and Palliative Medicine

## 2021-02-07 ENCOUNTER — Encounter: Payer: BC Managed Care – PPO | Admitting: Hospice and Palliative Medicine

## 2021-02-07 ENCOUNTER — Encounter: Payer: Self-pay | Admitting: Hospice and Palliative Medicine

## 2021-02-07 ENCOUNTER — Other Ambulatory Visit: Payer: Self-pay

## 2021-02-07 ENCOUNTER — Ambulatory Visit: Payer: BC Managed Care – PPO | Admitting: Hospice and Palliative Medicine

## 2021-02-07 VITALS — BP 100/74 | HR 94 | Temp 97.3°F | Resp 16 | Ht 72.0 in | Wt 176.2 lb

## 2021-02-07 DIAGNOSIS — G834 Cauda equina syndrome: Secondary | ICD-10-CM

## 2021-02-07 DIAGNOSIS — R41840 Attention and concentration deficit: Secondary | ICD-10-CM

## 2021-02-07 DIAGNOSIS — G4719 Other hypersomnia: Secondary | ICD-10-CM

## 2021-02-07 DIAGNOSIS — S32012A Unstable burst fracture of first lumbar vertebra, initial encounter for closed fracture: Secondary | ICD-10-CM | POA: Diagnosis not present

## 2021-02-07 MED ORDER — BUPROPION HCL ER (SR) 100 MG PO TB12: 100.0000 mg | ORAL_TABLET | Freq: Every day | ORAL | 0 refills | Status: DC

## 2021-02-07 MED ORDER — AMPHETAMINE-DEXTROAMPHETAMINE 20 MG PO TABS: 20.0000 mg | ORAL_TABLET | Freq: Two times a day (BID) | ORAL | 0 refills | Status: DC

## 2021-02-07 MED ORDER — AMPHETAMINE-DEXTROAMPHETAMINE 20 MG PO TABS: 20.0000 mg | ORAL_TABLET | Freq: Two times a day (BID) | ORAL | 0 refills | Status: DC | PRN
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
## 2021-02-08 ENCOUNTER — Encounter: Payer: Self-pay | Admitting: Hospice and Palliative Medicine

## 2021-02-09 DIAGNOSIS — N312 Flaccid neuropathic bladder, not elsewhere classified: Secondary | ICD-10-CM | POA: Diagnosis not present

## 2021-02-09 DIAGNOSIS — K649 Unspecified hemorrhoids: Secondary | ICD-10-CM | POA: Diagnosis not present

## 2021-02-12 ENCOUNTER — Other Ambulatory Visit: Payer: Self-pay | Admitting: Registered Nurse

## 2021-02-12 DIAGNOSIS — M5416 Radiculopathy, lumbar region: Secondary | ICD-10-CM | POA: Diagnosis not present

## 2021-02-12 DIAGNOSIS — Z981 Arthrodesis status: Secondary | ICD-10-CM | POA: Diagnosis not present

## 2021-02-12 DIAGNOSIS — F1120 Opioid dependence, uncomplicated: Secondary | ICD-10-CM | POA: Diagnosis not present

## 2021-02-22 DIAGNOSIS — S32012A Unstable burst fracture of first lumbar vertebra, initial encounter for closed fracture: Secondary | ICD-10-CM | POA: Diagnosis not present

## 2021-02-26 DIAGNOSIS — R339 Retention of urine, unspecified: Secondary | ICD-10-CM | POA: Diagnosis not present

## 2021-02-28 DIAGNOSIS — S32012A Unstable burst fracture of first lumbar vertebra, initial encounter for closed fracture: Secondary | ICD-10-CM | POA: Diagnosis not present

## 2021-03-12 DIAGNOSIS — F1120 Opioid dependence, uncomplicated: Secondary | ICD-10-CM | POA: Diagnosis not present

## 2021-03-12 DIAGNOSIS — Z981 Arthrodesis status: Secondary | ICD-10-CM | POA: Diagnosis not present

## 2021-03-12 DIAGNOSIS — S32012A Unstable burst fracture of first lumbar vertebra, initial encounter for closed fracture: Secondary | ICD-10-CM | POA: Diagnosis not present

## 2021-03-12 DIAGNOSIS — M5416 Radiculopathy, lumbar region: Secondary | ICD-10-CM | POA: Diagnosis not present

## 2021-03-14 DIAGNOSIS — S32012A Unstable burst fracture of first lumbar vertebra, initial encounter for closed fracture: Secondary | ICD-10-CM | POA: Diagnosis not present

## 2021-03-19 ENCOUNTER — Telehealth: Payer: Self-pay

## 2021-03-19 DIAGNOSIS — S32012A Unstable burst fracture of first lumbar vertebra, initial encounter for closed fracture: Secondary | ICD-10-CM | POA: Diagnosis not present

## 2021-03-19 NOTE — Telephone Encounter (Signed)
Called [facility] [phone] please send to urology or who ever order before

## 2021-03-26 DIAGNOSIS — K641 Second degree hemorrhoids: Secondary | ICD-10-CM | POA: Diagnosis not present

## 2021-03-26 DIAGNOSIS — K644 Residual hemorrhoidal skin tags: Secondary | ICD-10-CM | POA: Diagnosis not present

## 2021-03-26 DIAGNOSIS — G834 Cauda equina syndrome: Secondary | ICD-10-CM | POA: Diagnosis not present

## 2021-03-26 DIAGNOSIS — K5900 Constipation, unspecified: Secondary | ICD-10-CM | POA: Diagnosis not present

## 2021-03-29 DIAGNOSIS — R339 Retention of urine, unspecified: Secondary | ICD-10-CM | POA: Diagnosis not present

## 2021-04-11 ENCOUNTER — Encounter: Payer: BC Managed Care – PPO | Admitting: Hospice and Palliative Medicine

## 2021-04-11 DIAGNOSIS — F1120 Opioid dependence, uncomplicated: Secondary | ICD-10-CM | POA: Diagnosis not present

## 2021-04-11 DIAGNOSIS — Z981 Arthrodesis status: Secondary | ICD-10-CM | POA: Diagnosis not present

## 2021-04-11 DIAGNOSIS — S32012A Unstable burst fracture of first lumbar vertebra, initial encounter for closed fracture: Secondary | ICD-10-CM | POA: Diagnosis not present

## 2021-04-12 ENCOUNTER — Telehealth: Payer: Self-pay

## 2021-04-12 NOTE — Telephone Encounter (Signed)
lvm to arrive 30 mins early for 5.27.22 appt-Kevin Whitehead

## 2021-04-13 ENCOUNTER — Ambulatory Visit (INDEPENDENT_AMBULATORY_CARE_PROVIDER_SITE_OTHER): Payer: BC Managed Care – PPO | Admitting: Physician Assistant

## 2021-04-13 ENCOUNTER — Other Ambulatory Visit: Payer: Self-pay

## 2021-04-13 ENCOUNTER — Encounter: Payer: Self-pay | Admitting: Physician Assistant

## 2021-04-13 DIAGNOSIS — R300 Dysuria: Secondary | ICD-10-CM | POA: Diagnosis not present

## 2021-04-13 DIAGNOSIS — G834 Cauda equina syndrome: Secondary | ICD-10-CM

## 2021-04-13 DIAGNOSIS — G4719 Other hypersomnia: Secondary | ICD-10-CM

## 2021-04-13 DIAGNOSIS — Z79899 Other long term (current) drug therapy: Secondary | ICD-10-CM | POA: Diagnosis not present

## 2021-04-13 DIAGNOSIS — Z0001 Encounter for general adult medical examination with abnormal findings: Secondary | ICD-10-CM | POA: Diagnosis not present

## 2021-04-13 DIAGNOSIS — R5383 Other fatigue: Secondary | ICD-10-CM

## 2021-04-13 DIAGNOSIS — R41840 Attention and concentration deficit: Secondary | ICD-10-CM | POA: Diagnosis not present

## 2021-04-13 LAB — POCT URINE DRUG SCREEN
Methylenedioxyamphetamine: NOT DETECTED
POC Amphetamine UR: POSITIVE — AB
POC BENZODIAZEPINES UR: NOT DETECTED
POC Barbiturate UR: NOT DETECTED
POC Cocaine UR: NOT DETECTED
POC Ecstasy UR: NOT DETECTED
POC Marijuana UR: NOT DETECTED
POC Methadone UR: NOT DETECTED
POC Methamphetamine UR: NOT DETECTED
POC Opiate Ur: NOT DETECTED
POC Oxycodone UR: POSITIVE — AB
POC PHENCYCLIDINE UR: NOT DETECTED
POC TRICYCLICS UR: NOT DETECTED

## 2021-04-13 MED ORDER — AMPHETAMINE-DEXTROAMPHETAMINE 20 MG PO TABS: 20.0000 mg | ORAL_TABLET | Freq: Two times a day (BID) | ORAL | 0 refills | Status: DC | PRN

## 2021-04-13 MED ORDER — AMPHETAMINE-DEXTROAMPHETAMINE 20 MG PO TABS: 20.0000 mg | ORAL_TABLET | Freq: Two times a day (BID) | ORAL | 0 refills | Status: DC

## 2021-04-13 MED ORDER — HYDROCORTISONE ACETATE 25 MG RE SUPP: RECTAL | 0 refills | Status: DC

## 2021-04-13 MED ORDER — BUPROPION HCL ER (SR) 100 MG PO TB12: 100.0000 mg | ORAL_TABLET | Freq: Every day | ORAL | 0 refills | Status: DC
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
## 2021-04-14 LAB — UA/M W/RFLX CULTURE, ROUTINE
Bilirubin, UA: NEGATIVE
Glucose, UA: NEGATIVE
Ketones, UA: NEGATIVE
Leukocytes,UA: NEGATIVE
Nitrite, UA: NEGATIVE
RBC, UA: NEGATIVE
Specific Gravity, UA: 1.024 (ref 1.005–1.030)
Urobilinogen, Ur: 1 mg/dL (ref 0.2–1.0)
pH, UA: 7 (ref 5.0–7.5)

## 2021-04-14 LAB — MICROSCOPIC EXAMINATION
Bacteria, UA: NONE SEEN
Casts: NONE SEEN /lpf
Epithelial Cells (non renal): NONE SEEN /hpf (ref 0–10)
WBC, UA: NONE SEEN /hpf (ref 0–5)

## 2021-05-02 DIAGNOSIS — R339 Retention of urine, unspecified: Secondary | ICD-10-CM | POA: Diagnosis not present

## 2021-06-04 DIAGNOSIS — S32012A Unstable burst fracture of first lumbar vertebra, initial encounter for closed fracture: Secondary | ICD-10-CM | POA: Diagnosis not present

## 2021-06-04 DIAGNOSIS — Z981 Arthrodesis status: Secondary | ICD-10-CM | POA: Diagnosis not present

## 2021-06-04 DIAGNOSIS — R8271 Bacteriuria: Secondary | ICD-10-CM | POA: Diagnosis not present

## 2021-06-04 DIAGNOSIS — R030 Elevated blood-pressure reading, without diagnosis of hypertension: Secondary | ICD-10-CM | POA: Insufficient documentation

## 2021-06-04 DIAGNOSIS — N451 Epididymitis: Secondary | ICD-10-CM | POA: Diagnosis not present

## 2021-06-05 DIAGNOSIS — R339 Retention of urine, unspecified: Secondary | ICD-10-CM | POA: Diagnosis not present

## 2021-06-06 DIAGNOSIS — N451 Epididymitis: Secondary | ICD-10-CM | POA: Diagnosis not present

## 2021-06-19 DIAGNOSIS — N312 Flaccid neuropathic bladder, not elsewhere classified: Secondary | ICD-10-CM | POA: Diagnosis not present

## 2021-06-19 DIAGNOSIS — R338 Other retention of urine: Secondary | ICD-10-CM | POA: Diagnosis not present

## 2021-06-28 ENCOUNTER — Other Ambulatory Visit: Payer: Self-pay | Admitting: Physician Assistant

## 2021-06-28 DIAGNOSIS — R300 Dysuria: Secondary | ICD-10-CM

## 2021-06-28 DIAGNOSIS — R41840 Attention and concentration deficit: Secondary | ICD-10-CM

## 2021-06-28 DIAGNOSIS — Z0001 Encounter for general adult medical examination with abnormal findings: Secondary | ICD-10-CM

## 2021-06-28 DIAGNOSIS — G4719 Other hypersomnia: Secondary | ICD-10-CM

## 2021-06-28 DIAGNOSIS — G834 Cauda equina syndrome: Secondary | ICD-10-CM

## 2021-06-28 DIAGNOSIS — Z79899 Other long term (current) drug therapy: Secondary | ICD-10-CM

## 2021-06-28 DIAGNOSIS — R5383 Other fatigue: Secondary | ICD-10-CM

## 2021-07-06 DIAGNOSIS — R339 Retention of urine, unspecified: Secondary | ICD-10-CM | POA: Diagnosis not present

## 2021-07-12 DIAGNOSIS — Z981 Arthrodesis status: Secondary | ICD-10-CM | POA: Diagnosis not present

## 2021-07-12 DIAGNOSIS — S32012A Unstable burst fracture of first lumbar vertebra, initial encounter for closed fracture: Secondary | ICD-10-CM | POA: Diagnosis not present

## 2021-07-12 DIAGNOSIS — F1120 Opioid dependence, uncomplicated: Secondary | ICD-10-CM | POA: Diagnosis not present

## 2021-07-13 ENCOUNTER — Ambulatory Visit (INDEPENDENT_AMBULATORY_CARE_PROVIDER_SITE_OTHER): Payer: BC Managed Care – PPO | Admitting: Physician Assistant

## 2021-07-13 ENCOUNTER — Encounter: Payer: Self-pay | Admitting: Physician Assistant

## 2021-07-13 ENCOUNTER — Other Ambulatory Visit: Payer: Self-pay

## 2021-07-13 DIAGNOSIS — G4719 Other hypersomnia: Secondary | ICD-10-CM

## 2021-07-13 DIAGNOSIS — Z981 Arthrodesis status: Secondary | ICD-10-CM | POA: Insufficient documentation

## 2021-07-13 DIAGNOSIS — G834 Cauda equina syndrome: Secondary | ICD-10-CM

## 2021-07-13 DIAGNOSIS — R41840 Attention and concentration deficit: Secondary | ICD-10-CM

## 2021-07-13 MED ORDER — AMPHETAMINE-DEXTROAMPHETAMINE 20 MG PO TABS: 20.0000 mg | ORAL_TABLET | Freq: Two times a day (BID) | ORAL | 0 refills | Status: DC

## 2021-07-13 MED ORDER — BUPROPION HCL ER (SR) 100 MG PO TB12: 100.0000 mg | ORAL_TABLET | Freq: Every day | ORAL | 1 refills | Status: DC

## 2021-07-13 MED ORDER — AMPHETAMINE-DEXTROAMPHETAMINE 20 MG PO TABS: 20.0000 mg | ORAL_TABLET | Freq: Two times a day (BID) | ORAL | 0 refills | Status: DC | PRN

## 2021-07-13 NOTE — Progress Notes (Signed)
[facility] [address] [place], [place] [address]  Internal MEDICINE  Office Visit Note  Patient Name: Kevin Whitehead  [id number]  [id number]  Date of Service: 07/13/2021  Chief Complaint  Patient presents with   Follow-up    Medication refills    HPI Pt is here for routine follow up for med refill -He is followed by [facility] and receives his pain medication from their office. He reports no changes and had a follow up with them yesterday. - he takes first dose of adderall at 6:30 then again at 3-4pm. Sleeping well unless in pain. Denies any palpitations. He is unable to skip any days due to feeling too sleepy and out of focus without medication if he misses a dose. -BP always low, but denies any symptoms such as feeling lightheaded or dizziness  Current Medication: Outpatient Encounter Medications as of 07/13/2021  Medication Sig Note   acetaminophen (TYLENOL) 325 MG tablet Take 2 tablets (650 mg total) by mouth every 6 (six) hours as needed for mild pain (or Fever >/= 101).    Ascorbic Acid (VITAMIN C) 1000 MG tablet Take 1,000 mg by mouth daily.    bethanechol (URECHOLINE) 25 MG tablet Take 1 tablet (25 mg total) by mouth 3 (three) times daily.    bisacodyl (DULCOLAX) 10 MG suppository Place 1 suppository (10 mg total) rectally daily after supper.    BuPROPion HBr (APLENZIN) 174 MG TB24 Take by mouth.    cyanocobalamin 1000 MCG tablet Take 1,000 mcg by mouth daily. 5,000 mcg    diazepam (VALIUM) 5 MG tablet TAKE 1-2 TABLETS (5-10 MG TOTAL) BY MOUTH AT BEDTIME AS NEEDED FOR MUSCLE SPASMS.    docusate sodium (COLACE) 100 MG capsule Take 100 mg by mouth daily.    DULoxetine (CYMBALTA) 60 MG capsule Take 1 capsule (60 mg total) by mouth at bedtime.    fluticasone (FLONASE) 50 MCG/ACT nasal spray Place 1 spray into both nostrils daily as needed for allergies or rhinitis.    hydrocortisone (ANUSOL-HC) 25 MG suppository PLACE ONE SUPPOSITORY  RECTALLY TWO TIMES DAILY    meloxicam (MOBIC) 15 MG tablet Take by mouth.    Multiple Vitamin (MULTIVITAMIN) capsule Take 1 capsule by mouth daily.    nabumetone (RELAFEN) 500 MG tablet Take 1 tablet (500 mg total) by mouth 2 (two) times daily.    oxyCODONE 10 MG TABS Take 1 tablet (10 mg total) by mouth every 3 (three) hours as needed for moderate pain. 12/06/2020: #50 on 11/28/20 #12 today LD 12/06/20   polyethylene glycol (MIRALAX / GLYCOLAX) 17 g packet Take 17 g by mouth daily.    sildenafil (VIAGRA) 100 MG tablet Take 100 mg by mouth as needed.    tamsulosin (FLOMAX) 0.4 MG CAPS capsule TAKE 2 CAPSULES (0.8 MG TOTAL) BY MOUTH DAILY AFTER SUPPER.    [DISCONTINUED] amphetamine-dextroamphetamine (ADDERALL) 20 MG tablet Take 1 tablet (20 mg total) by mouth 2 (two) times daily as needed (for focus).    [DISCONTINUED] amphetamine-dextroamphetamine (ADDERALL) 20 MG tablet Take 1 tablet (20 mg total) by mouth 2 (two) times daily.    [DISCONTINUED] amphetamine-dextroamphetamine (ADDERALL) 20 MG tablet Take 1 tablet (20 mg total) by mouth 2 (two) times daily.    [DISCONTINUED] buPROPion (WELLBUTRIN SR) 100 MG 12 hr tablet Take 1 tablet (100 mg total) by mouth daily.    amphetamine-dextroamphetamine (ADDERALL) 20 MG tablet Take 1 tablet (20 mg total) by mouth 2 (two) times daily as  needed (for focus).    amphetamine-dextroamphetamine (ADDERALL) 20 MG tablet Take 1 tablet (20 mg total) by mouth 2 (two) times daily.    amphetamine-dextroamphetamine (ADDERALL) 20 MG tablet Take 1 tablet (20 mg total) by mouth 2 (two) times daily.    buPROPion ER (WELLBUTRIN SR) 100 MG 12 hr tablet Take 1 tablet (100 mg total) by mouth daily.    No facility-administered encounter medications on file as of 07/13/2021.    Surgical History: Past Surgical History:  Procedure Laterality Date   ANKLE SURGERY  2016   POSTERIOR LUMBAR FUSION 4 LEVEL N/A 10/30/2020   Procedure: POSTERIOR LUMBAR FUSION 4 LEVEL;  Surgeon: Kevin Whitehead,  Henry, MD;  Location: [facility] OR;  Service: Neurosurgery;  Laterality: N/A;  Lumbar one decompressive laminectomy with transpedicular decompression, Thoracic eleven through Lumbar three posterior lateral arthrodesis utilizing pedicle screw fixation and local autografting    Medical History: Past Medical History:  Diagnosis Date   Attention and concentration deficit    Hemorrhoid     Family History: Family History  Problem Relation Age of Onset   COPD Father     Social History   Socioeconomic History   Marital status: Married    Spouse name: Not on file   Number of children: Not on file   Years of education: Not on file   Highest education level: Not on file  Occupational History   Not on file  Tobacco Use   Smoking status: Former    Types: Cigarettes    Quit date: 2017    Years since quitting: 5.6   Smokeless tobacco: Never  Vaping Use   Vaping Use: Never used  Substance and Sexual Activity   Alcohol use: No   Drug use: No   Sexual activity: Not on file  Other Topics Concern   Not on file  Social History Narrative   Not on file   Social Determinants of Health   Financial Resource Strain: Not on file  Food Insecurity: Not on file  Transportation Needs: Not on file  Physical Activity: Not on file  Stress: Not on file  Social Connections: Not on file  Intimate Partner Violence: Not on file      Review of Systems  Constitutional:  Positive for fatigue. Negative for chills and unexpected weight change.  HENT:  Negative for congestion, postnasal drip, rhinorrhea, sneezing and sore throat.   Eyes:  Negative for redness.  Respiratory:  Negative for cough, chest tightness and shortness of breath.   Cardiovascular:  Negative for chest pain and palpitations.  Gastrointestinal:  Negative for abdominal pain, constipation, diarrhea, nausea and vomiting.  Genitourinary:  Positive for difficulty urinating. Negative for dysuria and frequency.       Self catheterization    Musculoskeletal:  Positive for arthralgias and back pain. Negative for joint swelling and neck pain.  Skin:  Negative for rash.  Neurological:  Positive for numbness. Negative for tremors.       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 Does not bruise/bleed easily.  Psychiatric/Behavioral:  Positive for decreased concentration. Negative for behavioral problems (Depression), sleep disturbance and suicidal ideas. The patient is not nervous/anxious.    Vital Signs: BP 90/70   Pulse 80   Temp 98.6 F (37 C)   Resp 16   Ht 6' (1.829 m)   Wt 180 lb 12.8 oz (82 kg)   SpO2 95%   BMI 24.52 kg/m    Physical Exam  Vitals and nursing note reviewed.  Constitutional:      General: He is not in acute distress.    Appearance: He is well-developed and normal weight. He is not diaphoretic.  HENT:     Head: Normocephalic and atraumatic.     Right Ear: External ear normal.     Left Ear: External ear normal.     Nose: Nose normal.     Mouth/Throat:     Pharynx: No oropharyngeal exudate.  Eyes:     General: No scleral icterus.       Right eye: No discharge.        Left eye: No discharge.     Conjunctiva/sclera: Conjunctivae normal.     Pupils: Pupils are equal, round, and reactive to light.  Neck:     Thyroid: No thyromegaly.     Vascular: No JVD.     Trachea: No tracheal deviation.  Cardiovascular:     Rate and Rhythm: Normal rate and regular rhythm.     Heart sounds: Normal heart sounds. No murmur heard.   No friction rub. No gallop.  Pulmonary:     Effort: Pulmonary effort is normal. No respiratory distress.     Breath sounds: Normal breath sounds. No stridor. No wheezing or rales.  Chest:     Chest wall: No tenderness.  Abdominal:     General: Bowel sounds are normal. There is no distension.     Palpations: Abdomen is soft. There is no mass.     Tenderness: There is no abdominal tenderness. There is  no guarding or rebound.  Musculoskeletal:        General: No tenderness or deformity. Normal range of motion.     Cervical back: Normal range of motion and neck supple.     Comments: Limited ROM from back injury  Lymphadenopathy:     Cervical: No cervical adenopathy.  Skin:    General: Skin is warm and dry.     Coloration: Skin is not pale.     Findings: No erythema or rash.  Neurological:     Mental Status: He is alert.     Cranial Nerves: No cranial nerve deficit.     Sensory: Sensory deficit present.     Motor: No abnormal muscle tone.     Coordination: Coordination normal.     Deep Tendon Reflexes: Reflexes are normal and symmetric.  Psychiatric:        Behavior: Behavior normal.        Thought Content: Thought content normal.        Judgment: Judgment normal.       Assessment/Plan: 1. Attention and concentration deficit May continue adderall up to twice per day as needed.  - amphetamine-dextroamphetamine (ADDERALL) 20 MG tablet; Take 1 tablet (20 mg total) by mouth 2 (two) times daily as needed (for focus).  Dispense: 60 tablet; Refill: 0 - amphetamine-dextroamphetamine (ADDERALL) 20 MG tablet; Take 1 tablet (20 mg total) by mouth 2 (two) times daily.  Dispense: 60 tablet; Refill: 0 - amphetamine-dextroamphetamine (ADDERALL) 20 MG tablet; Take 1 tablet (20 mg total) by mouth 2 (two) times daily.  Dispense: 60 tablet; Refill: 0 - buPROPion ER (WELLBUTRIN SR) 100 MG 12 hr tablet; Take 1 tablet (100 mg total) by mouth daily.  Dispense: 90 tablet; Refill: 1  [place] Controlled Substance Database was reviewed by me for overdose risk score (ORS) Refilled Controlled medications today. Reviewed risks and possible side effects associated with taking Stimulants. Combination of these drugs with  other psychotropic medications could cause dizziness and drowsiness. Pt needs to Monitor symptoms and exercise caution in driving and operating heavy machinery to avoid damages to oneself, to others  and to the surroundings. Patient verbalized understanding in this matter. Dependence and abuse for these drugs will be monitored closely. A Controlled substance policy and procedure is on file which allows [facility] to order a urine drug screen test at any visit. Patient understands and agrees with the plan..   2. Excessive daytime sleepiness May continue adderall up to twice per day. May continue wellbutrin as well - amphetamine-dextroamphetamine (ADDERALL) 20 MG tablet; Take 1 tablet (20 mg total) by mouth 2 (two) times daily as needed (for focus).  Dispense: 60 tablet; Refill: 0 - amphetamine-dextroamphetamine (ADDERALL) 20 MG tablet; Take 1 tablet (20 mg total) by mouth 2 (two) times daily.  Dispense: 60 tablet; Refill: 0 - amphetamine-dextroamphetamine (ADDERALL) 20 MG tablet; Take 1 tablet (20 mg total) by mouth 2 (two) times daily.  Dispense: 60 tablet; Refill: 0 - buPROPion ER (WELLBUTRIN SR) 100 MG 12 hr tablet; Take 1 tablet (100 mg total) by mouth daily.  Dispense: 90 tablet; Refill: 1  3. Cauda equina compression (HCC) Followed by neurosurgery for pain management   General Counseling: yadiel aubry understanding of the findings of todays visit and agrees with plan of treatment. I have discussed any further diagnostic evaluation that may be needed or ordered today. We also reviewed his medications today. he has been encouraged to call the office with any questions or concerns that should arise related to todays visit.    No orders of the defined types were placed in this encounter.   Meds ordered this encounter  Medications   amphetamine-dextroamphetamine (ADDERALL) 20 MG tablet    Sig: Take 1 tablet (20 mg total) by mouth 2 (two) times daily as needed (for focus).    Dispense:  60 tablet    Refill:  0   amphetamine-dextroamphetamine (ADDERALL) 20 MG tablet    Sig: Take 1 tablet (20 mg total) by mouth 2 (two) times daily.    Dispense:  60 tablet    Refill:  0     Do not fill prior to 08/13/21   amphetamine-dextroamphetamine (ADDERALL) 20 MG tablet    Sig: Take 1 tablet (20 mg total) by mouth 2 (two) times daily.    Dispense:  60 tablet    Refill:  0    Do not fill until 09/12/21   buPROPion ER (WELLBUTRIN SR) 100 MG 12 hr tablet    Sig: Take 1 tablet (100 mg total) by mouth daily.    Dispense:  90 tablet    Refill:  1    This patient was seen by Lynn Ito, PA-C in collaboration with Dr. Beverely Risen as a part of collaborative care agreement.   Total time spent:30 Minutes Time spent includes review of chart, medications, test results, and follow up plan with the patient.      Dr Lyndon Code Internal medicine

## 2021-07-18 DIAGNOSIS — N312 Flaccid neuropathic bladder, not elsewhere classified: Secondary | ICD-10-CM | POA: Diagnosis not present

## 2021-08-15 DIAGNOSIS — R339 Retention of urine, unspecified: Secondary | ICD-10-CM | POA: Diagnosis not present

## 2021-08-30 DIAGNOSIS — F1120 Opioid dependence, uncomplicated: Secondary | ICD-10-CM | POA: Diagnosis not present

## 2021-08-30 DIAGNOSIS — Z981 Arthrodesis status: Secondary | ICD-10-CM | POA: Diagnosis not present

## 2021-09-17 DIAGNOSIS — R339 Retention of urine, unspecified: Secondary | ICD-10-CM | POA: Diagnosis not present

## 2021-10-15 ENCOUNTER — Other Ambulatory Visit: Payer: Self-pay

## 2021-10-15 ENCOUNTER — Ambulatory Visit: Payer: BC Managed Care – PPO | Admitting: Physician Assistant

## 2021-10-15 ENCOUNTER — Encounter: Payer: Self-pay | Admitting: Physician Assistant

## 2021-10-15 DIAGNOSIS — Z79899 Other long term (current) drug therapy: Secondary | ICD-10-CM

## 2021-10-15 DIAGNOSIS — G4719 Other hypersomnia: Secondary | ICD-10-CM

## 2021-10-15 DIAGNOSIS — G834 Cauda equina syndrome: Secondary | ICD-10-CM | POA: Diagnosis not present

## 2021-10-15 DIAGNOSIS — R41840 Attention and concentration deficit: Secondary | ICD-10-CM

## 2021-10-15 LAB — POCT URINE DRUG SCREEN
POC Amphetamine UR: POSITIVE — AB
POC BENZODIAZEPINES UR: NOT DETECTED
POC Barbiturate UR: NOT DETECTED
POC Cocaine UR: NOT DETECTED
POC Ecstasy UR: NOT DETECTED
POC Marijuana UR: NOT DETECTED
POC Methadone UR: NOT DETECTED
POC Methamphetamine UR: NOT DETECTED
POC Opiate Ur: NOT DETECTED
POC Oxycodone UR: POSITIVE — AB
POC PHENCYCLIDINE UR: NOT DETECTED
POC TRICYCLICS UR: NOT DETECTED

## 2021-10-15 MED ORDER — AMPHETAMINE-DEXTROAMPHETAMINE 20 MG PO TABS
20.0000 mg | ORAL_TABLET | Freq: Two times a day (BID) | ORAL | 0 refills | Status: DC
Start: 2021-11-14 — End: 2021-12-17

## 2021-10-15 MED ORDER — AMPHETAMINE-DEXTROAMPHETAMINE 20 MG PO TABS
20.0000 mg | ORAL_TABLET | Freq: Two times a day (BID) | ORAL | 0 refills | Status: DC | PRN
Start: 2021-10-15 — End: 2021-12-27

## 2021-10-15 MED ORDER — AMPHETAMINE-DEXTROAMPHETAMINE 20 MG PO TABS: 20.0000 mg | ORAL_TABLET | Freq: Two times a day (BID) | ORAL | 0 refills | Status: DC

## 2021-10-15 NOTE — Progress Notes (Signed)
[facility] [address] [place], [place] [address]  Internal MEDICINE  Office Visit Note  Patient Name: Kevin Whitehead  [id number]  [id number]  Date of Service: 10/16/2021  Chief Complaint  Patient presents with   ADD   Medication Refill   Follow-up    HPI Pt is here for routine follow up for medication refill -No trouble sleeping and has a good appetite. Denies any palpitations. -He is taking adderall BID to help with ADHD as well as excessive sleepiness. He has had a sleep study in the past which was negative for OSA. He denies any snoring, gasping, or morning headaches. Discussed if any of these develop of any other changes occur then we may need to reconsider repeat study or ONO. -Continues to be followed by pain management and reports he is now seeing neurosurgery q4-6 months for follow up and things have been stable. -No changes since last visit  Current Medication: Outpatient Encounter Medications as of 10/15/2021  Medication Sig Note   acetaminophen (TYLENOL) 325 MG tablet Take 2 tablets (650 mg total) by mouth every 6 (six) hours as needed for mild pain (or Fever >/= 101).    amphetamine-dextroamphetamine (ADDERALL) 20 MG tablet Take 1 tablet (20 mg total) by mouth 2 (two) times daily as needed (for focus).    [START ON 11/14/2021] amphetamine-dextroamphetamine (ADDERALL) 20 MG tablet Take 1 tablet (20 mg total) by mouth 2 (two) times daily.    [START ON 12/15/2021] amphetamine-dextroamphetamine (ADDERALL) 20 MG tablet Take 1 tablet (20 mg total) by mouth 2 (two) times daily.    Ascorbic Acid (VITAMIN C) 1000 MG tablet Take 1,000 mg by mouth daily.    bethanechol (URECHOLINE) 25 MG tablet Take 1 tablet (25 mg total) by mouth 3 (three) times daily.    bisacodyl (DULCOLAX) 10 MG suppository Place 1 suppository (10 mg total) rectally daily after supper.    buPROPion ER (WELLBUTRIN SR) 100 MG 12 hr tablet Take 1 tablet (100 mg total) by mouth daily.     BuPROPion HBr (APLENZIN) 174 MG TB24 Take by mouth.    cyanocobalamin 1000 MCG tablet Take 1,000 mcg by mouth daily. 5,000 mcg    diazepam (VALIUM) 5 MG tablet TAKE 1-2 TABLETS (5-10 MG TOTAL) BY MOUTH AT BEDTIME AS NEEDED FOR MUSCLE SPASMS.    docusate sodium (COLACE) 100 MG capsule Take 100 mg by mouth daily.    DULoxetine (CYMBALTA) 60 MG capsule Take 1 capsule (60 mg total) by mouth at bedtime.    fluticasone (FLONASE) 50 MCG/ACT nasal spray Place 1 spray into both nostrils daily as needed for allergies or rhinitis.    hydrocortisone (ANUSOL-HC) 25 MG suppository PLACE ONE SUPPOSITORY RECTALLY TWO TIMES DAILY    meloxicam (MOBIC) 15 MG tablet Take by mouth.    Multiple Vitamin (MULTIVITAMIN) capsule Take 1 capsule by mouth daily.    nabumetone (RELAFEN) 500 MG tablet Take 1 tablet (500 mg total) by mouth 2 (two) times daily.    oxyCODONE 10 MG TABS Take 1 tablet (10 mg total) by mouth every 3 (three) hours as needed for moderate pain. 12/06/2020: #50 on 11/28/20 #12 today LD 12/06/20   polyethylene glycol (MIRALAX / GLYCOLAX) 17 g packet Take 17 g by mouth daily.    sildenafil (VIAGRA) 100 MG tablet Take 100 mg by mouth as needed.    tamsulosin (FLOMAX) 0.4 MG CAPS capsule TAKE 2 CAPSULES (0.8 MG TOTAL) BY MOUTH DAILY AFTER SUPPER.    [DISCONTINUED] amphetamine-dextroamphetamine (  ADDERALL) 20 MG tablet Take 1 tablet (20 mg total) by mouth 2 (two) times daily as needed (for focus).    [DISCONTINUED] amphetamine-dextroamphetamine (ADDERALL) 20 MG tablet Take 1 tablet (20 mg total) by mouth 2 (two) times daily.    [DISCONTINUED] amphetamine-dextroamphetamine (ADDERALL) 20 MG tablet Take 1 tablet (20 mg total) by mouth 2 (two) times daily.    No facility-administered encounter medications on file as of 10/15/2021.    Surgical History: Past Surgical History:  Procedure Laterality Date   ANKLE SURGERY  2016   POSTERIOR LUMBAR FUSION 4 LEVEL N/A 10/30/2020   Procedure: POSTERIOR LUMBAR FUSION 4  LEVEL;  Surgeon: Earnie Larsson, MD;  Location: [facility];  Service: Neurosurgery;  Laterality: N/A;  Lumbar one decompressive laminectomy with transpedicular decompression, Thoracic eleven through Lumbar three posterior lateral arthrodesis utilizing pedicle screw fixation and local autografting    Medical History: Past Medical History:  Diagnosis Date   Attention and concentration deficit    Hemorrhoid     Family History: Family History  Problem Relation Age of Onset   COPD Father     Social History   Socioeconomic History   Marital status: Married    Spouse name: Not on file   Number of children: Not on file   Years of education: Not on file   Highest education level: Not on file  Occupational History   Not on file  Tobacco Use   Smoking status: Former    Types: Cigarettes    Quit date: 2017    Years since quitting: 5.9   Smokeless tobacco: Never  Vaping Use   Vaping Use: Never used  Substance and Sexual Activity   Alcohol use: No   Drug use: No   Sexual activity: Not on file  Other Topics Concern   Not on file  Social History Narrative   Not on file   Social Determinants of Health   Financial Resource Strain: Not on file  Food Insecurity: Not on file  Transportation Needs: Not on file  Physical Activity: Not on file  Stress: Not on file  Social Connections: Not on file  Intimate Partner Violence: Not on file      Review of Systems  Constitutional:  Positive for fatigue. Negative for chills and unexpected weight change.  HENT:  Negative for congestion, postnasal drip, rhinorrhea, sneezing and sore throat.   Eyes:  Negative for redness.  Respiratory:  Negative for cough, chest tightness and shortness of breath.   Cardiovascular:  Negative for chest pain and palpitations.  Gastrointestinal:  Negative for abdominal pain, constipation, diarrhea, nausea and vomiting.  Genitourinary:  Positive for difficulty urinating. Negative for dysuria and frequency.        Self catheterization   Musculoskeletal:  Positive for arthralgias and back pain. Negative for joint swelling and neck pain.  Skin:  Negative for rash.  Neurological:  Positive for numbness. Negative for tremors.       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 Does not bruise/bleed easily.  Psychiatric/Behavioral:  Positive for decreased concentration. Negative for behavioral problems (Depression), sleep disturbance and suicidal ideas. The patient is not nervous/anxious.    Vital Signs: BP 104/70   Pulse 96   Temp 98 F (36.7 C)   Resp 16   Ht 6' (1.829 m)   Wt 184 lb (83.5 kg)   SpO2 97%   BMI 24.95 kg/m    Physical Exam Vitals and nursing note  reviewed.  Constitutional:      General: He is not in acute distress.    Appearance: He is well-developed and normal weight. He is not diaphoretic.  HENT:     Head: Normocephalic and atraumatic.     Right Ear: External ear normal.     Left Ear: External ear normal.     Nose: Nose normal.     Mouth/Throat:     Pharynx: No oropharyngeal exudate.  Eyes:     General: No scleral icterus.       Right eye: No discharge.        Left eye: No discharge.     Conjunctiva/sclera: Conjunctivae normal.     Pupils: Pupils are equal, round, and reactive to light.  Neck:     Thyroid: No thyromegaly.     Vascular: No JVD.     Trachea: No tracheal deviation.  Cardiovascular:     Rate and Rhythm: Normal rate and regular rhythm.     Heart sounds: Normal heart sounds. No murmur heard.   No friction rub. No gallop.  Pulmonary:     Effort: Pulmonary effort is normal. No respiratory distress.     Breath sounds: Normal breath sounds. No stridor. No wheezing or rales.  Chest:     Chest wall: No tenderness.  Abdominal:     General: Bowel sounds are normal. There is no distension.     Palpations: Abdomen is soft. There is no mass.     Tenderness: There is no abdominal  tenderness. There is no guarding or rebound.  Musculoskeletal:        General: No tenderness or deformity. Normal range of motion.     Cervical back: Normal range of motion and neck supple.     Comments: Limited ROM from back injury  Lymphadenopathy:     Cervical: No cervical adenopathy.  Skin:    General: Skin is warm and dry.     Coloration: Skin is not pale.     Findings: No erythema or rash.  Neurological:     Mental Status: He is alert.     Cranial Nerves: No cranial nerve deficit.     Sensory: Sensory deficit present.     Motor: No abnormal muscle tone.     Coordination: Coordination normal.     Deep Tendon Reflexes: Reflexes are normal and symmetric.  Psychiatric:        Behavior: Behavior normal.        Thought Content: Thought content normal.        Judgment: Judgment normal.       Assessment/Plan: 1. Attention and concentration deficit May continue adderall BID as needed, 3 scripts sent today dated for today, 11/14/21 and 12/15/21 - amphetamine-dextroamphetamine (ADDERALL) 20 MG tablet; Take 1 tablet (20 mg total) by mouth 2 (two) times daily as needed (for focus).  Dispense: 60 tablet; Refill: 0 - amphetamine-dextroamphetamine (ADDERALL) 20 MG tablet; Take 1 tablet (20 mg total) by mouth 2 (two) times daily.  Dispense: 60 tablet; Refill: 0 - amphetamine-dextroamphetamine (ADDERALL) 20 MG tablet; Take 1 tablet (20 mg total) by mouth 2 (two) times daily.  Dispense: 60 tablet; Refill: 0 [place] Controlled Substance Database was reviewed by me for overdose risk score (ORS) Refilled Controlled medications today. Reviewed risks and possible side effects associated with taking Stimulants. Combination of these drugs with other psychotropic medications could cause dizziness and drowsiness. Pt needs to Monitor symptoms and exercise caution in driving and operating heavy machinery to avoid damages  to oneself, to others and to the surroundings. Patient verbalized understanding in this  matter. Dependence and abuse for these drugs will be monitored closely. A Controlled substance policy and procedure is on file which allows [facility] to order a urine drug screen test at any visit. Patient understands and agrees with the plan..   2. Excessive daytime sleepiness May continue adderall BID as needed - amphetamine-dextroamphetamine (ADDERALL) 20 MG tablet; Take 1 tablet (20 mg total) by mouth 2 (two) times daily as needed (for focus).  Dispense: 60 tablet; Refill: 0 - amphetamine-dextroamphetamine (ADDERALL) 20 MG tablet; Take 1 tablet (20 mg total) by mouth 2 (two) times daily.  Dispense: 60 tablet; Refill: 0 - amphetamine-dextroamphetamine (ADDERALL) 20 MG tablet; Take 1 tablet (20 mg total) by mouth 2 (two) times daily.  Dispense: 60 tablet; Refill: 0  3. Cauda equina compression (HCC) Followed by neurosurgery and pain management  4. Encounter for long-term (current) use of high-risk medication - POCT Urine Drug Screen   General Counseling: [place] verbalizes understanding of the findings of todays visit and agrees with plan of treatment. I have discussed any further diagnostic evaluation that may be needed or ordered today. We also reviewed his medications today. he has been encouraged to call the office with any questions or concerns that should arise related to todays visit.    Orders Placed This Encounter  Procedures   POCT Urine Drug Screen    Meds ordered this encounter  Medications   amphetamine-dextroamphetamine (ADDERALL) 20 MG tablet    Sig: Take 1 tablet (20 mg total) by mouth 2 (two) times daily as needed (for focus).    Dispense:  60 tablet    Refill:  0   amphetamine-dextroamphetamine (ADDERALL) 20 MG tablet    Sig: Take 1 tablet (20 mg total) by mouth 2 (two) times daily.    Dispense:  60 tablet    Refill:  0    Do not fill prior to 11/14/21   amphetamine-dextroamphetamine (ADDERALL) 20 MG tablet    Sig: Take 1 tablet (20 mg total) by  mouth 2 (two) times daily.    Dispense:  60 tablet    Refill:  0    Do not fill until 12/15/21     This patient was seen by Drema Dallas, PA-C in collaboration with Dr. Clayborn Bigness as a part of collaborative care agreement.   Total time spent:30 Minutes Time spent includes review of chart, medications, test results, and follow up plan with the patient.      Dr Lavera Guise Internal medicine

## 2021-10-18 ENCOUNTER — Ambulatory Visit: Payer: BC Managed Care – PPO | Admitting: Physician Assistant

## 2021-10-18 DIAGNOSIS — R339 Retention of urine, unspecified: Secondary | ICD-10-CM | POA: Diagnosis not present

## 2021-11-22 DIAGNOSIS — R339 Retention of urine, unspecified: Secondary | ICD-10-CM | POA: Diagnosis not present

## 2021-11-29 DIAGNOSIS — M792 Neuralgia and neuritis, unspecified: Secondary | ICD-10-CM | POA: Diagnosis not present

## 2021-11-29 DIAGNOSIS — Z981 Arthrodesis status: Secondary | ICD-10-CM | POA: Diagnosis not present

## 2021-12-17 ENCOUNTER — Telehealth: Payer: Self-pay

## 2021-12-17 ENCOUNTER — Other Ambulatory Visit: Payer: Self-pay | Admitting: Physician Assistant

## 2021-12-17 DIAGNOSIS — R41840 Attention and concentration deficit: Secondary | ICD-10-CM

## 2021-12-17 DIAGNOSIS — G4719 Other hypersomnia: Secondary | ICD-10-CM

## 2021-12-17 MED ORDER — LISDEXAMFETAMINE DIMESYLATE 30 MG PO CAPS: 30.0000 mg | ORAL_CAPSULE | Freq: Every day | ORAL | 0 refills | Status: DC

## 2021-12-17 NOTE — Telephone Encounter (Signed)
Pt wife advised we send med  

## 2021-12-27 ENCOUNTER — Other Ambulatory Visit: Payer: Self-pay

## 2021-12-27 ENCOUNTER — Encounter: Payer: Self-pay | Admitting: Physician Assistant

## 2021-12-27 ENCOUNTER — Ambulatory Visit: Payer: BC Managed Care – PPO | Admitting: Physician Assistant

## 2021-12-27 VITALS — BP 103/73 | HR 91 | Temp 98.6°F | Resp 16 | Ht 72.0 in | Wt 186.6 lb

## 2021-12-27 DIAGNOSIS — R41840 Attention and concentration deficit: Secondary | ICD-10-CM | POA: Diagnosis not present

## 2021-12-27 DIAGNOSIS — G4719 Other hypersomnia: Secondary | ICD-10-CM

## 2021-12-27 DIAGNOSIS — Z981 Arthrodesis status: Secondary | ICD-10-CM | POA: Diagnosis not present

## 2021-12-27 DIAGNOSIS — M792 Neuralgia and neuritis, unspecified: Secondary | ICD-10-CM | POA: Diagnosis not present

## 2021-12-27 MED ORDER — METHYLPHENIDATE HCL 10 MG PO TABS: 10.0000 mg | ORAL_TABLET | Freq: Two times a day (BID) | ORAL | 0 refills | Status: DC

## 2021-12-27 NOTE — Progress Notes (Signed)
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 Unfortunately, Vyvance is not helping at all, its as if he hasnt taken anything. He does admit he tried taking a second tab of it one time and did not feel any different. He was nervous doing this which have impacted response some, but states no effect from medication. -Will try switching to Ritalin instead. Pt understands he cannot combine with the vyvance. If not available can given vyvance another try at increased dose. Patient agrees with plan and will inform office if any problems.   Current Medication: Outpatient Encounter Medications as of 12/27/2021  Medication Sig Note   acetaminophen (TYLENOL) 325 MG tablet Take 2 tablets (650 mg total) by mouth every 6 (six) hours as needed for mild pain (or Fever >/= 101).    Ascorbic Acid (VITAMIN C) 1000 MG tablet Take 1,000 mg by mouth daily.    bethanechol (URECHOLINE) 25 MG tablet Take 1 tablet (25 mg total) by mouth 3 (three) times daily.    bisacodyl (DULCOLAX) 10 MG suppository Place 1 suppository (10 mg total) rectally daily after supper.    buPROPion ER (WELLBUTRIN SR) 100 MG 12 hr tablet Take 1 tablet (100 mg total) by mouth daily.    BuPROPion HBr (APLENZIN) 174 MG TB24 Take by mouth.    cyanocobalamin 1000 MCG tablet Take 1,000 mcg by mouth daily. 5,000 mcg    diazepam (VALIUM) 5 MG tablet TAKE 1-2 TABLETS (5-10 MG TOTAL) BY MOUTH AT BEDTIME AS NEEDED FOR MUSCLE  SPASMS.    docusate sodium (COLACE) 100 MG capsule Take 100 mg by mouth daily.    DULoxetine (CYMBALTA) 60 MG capsule Take 1 capsule (60 mg total) by mouth at bedtime.    fluticasone (FLONASE) 50 MCG/ACT nasal spray Place 1 spray into both nostrils daily as needed for allergies or rhinitis.    hydrocortisone (ANUSOL-HC) 25 MG suppository PLACE ONE SUPPOSITORY RECTALLY TWO TIMES DAILY    meloxicam (MOBIC) 15 MG tablet Take by mouth.    methylphenidate (RITALIN) 10 MG tablet Take 1 tablet (10 mg total) by mouth 2 (two) times daily.    Multiple Vitamin (MULTIVITAMIN) capsule Take 1 capsule by mouth daily.    nabumetone (RELAFEN) 500 MG tablet Take 1 tablet (500 mg total) by mouth 2 (two) times daily.    oxyCODONE 10 MG TABS Take 1 tablet (10 mg total) by mouth every 3 (three) hours as needed for moderate pain. 12/06/2020: #50 on 11/28/20 #12 today LD 12/06/20   polyethylene glycol (MIRALAX / GLYCOLAX) 17 g packet Take 17 g by mouth daily.    sildenafil (VIAGRA) 100 MG tablet Take 100 mg by mouth as needed.    tamsulosin (FLOMAX) 0.4 MG CAPS capsule TAKE 2 CAPSULES (0.8 MG TOTAL) BY MOUTH DAILY AFTER SUPPER.    [DISCONTINUED] amphetamine-dextroamphetamine (ADDERALL) 20 MG tablet Take 1 tablet (20 mg total) by mouth 2 (two) times daily as needed (for focus).    [  DISCONTINUED] lisdexamfetamine (VYVANSE) 30 MG capsule Take 1 capsule (30 mg total) by mouth daily.    No facility-administered encounter medications on file as of 12/27/2021.    Surgical History: Past Surgical History:  Procedure Laterality Date   ANKLE SURGERY  2016   POSTERIOR LUMBAR FUSION 4 LEVEL N/A 10/30/2020   Procedure: POSTERIOR LUMBAR FUSION 4 LEVEL;  Surgeon: Julio SicksPool, Henry, MD;  Location: [facility];  Service: Neurosurgery;  Laterality: N/A;  Lumbar one decompressive laminectomy with transpedicular decompression, Thoracic eleven through Lumbar three posterior lateral arthrodesis utilizing pedicle screw fixation and local autografting     Medical History: Past Medical History:  Diagnosis Date   Attention and concentration deficit    Hemorrhoid     Family History: Family History  Problem Relation Age of Onset   COPD Father     Social History   Socioeconomic History   Marital status: Married    Spouse name: Not on file   Number of children: Not on file   Years of education: Not on file   Highest education level: Not on file  Occupational History   Not on file  Tobacco Use   Smoking status: Former    Types: Cigarettes    Quit date: 2017    Years since quitting: 6.1   Smokeless tobacco: Never  Vaping Use   Vaping Use: Never used  Substance and Sexual Activity   Alcohol use: No   Drug use: No   Sexual activity: Not on file  Other Topics Concern   Not on file  Social History Narrative   Not on file   Social Determinants of Health   Financial Resource Strain: Not on file  Food Insecurity: Not on file  Transportation Needs: Not on file  Physical Activity: Not on file  Stress: Not on file  Social Connections: Not on file  Intimate Partner Violence: Not on file      Review of Systems  Constitutional:  Positive for fatigue. Negative for chills and unexpected weight change.  HENT:  Negative for congestion, postnasal drip, rhinorrhea, sneezing and sore throat.   Eyes:  Negative for redness.  Respiratory:  Negative for cough, chest tightness and shortness of breath.   Cardiovascular:  Negative for chest pain and palpitations.  Gastrointestinal:  Negative for abdominal pain, constipation, diarrhea, nausea and vomiting.  Genitourinary:  Positive for difficulty urinating. Negative for dysuria and frequency.       Self catheterization   Musculoskeletal:  Positive for arthralgias and back pain. Negative for joint swelling and neck pain.  Skin:  Negative for rash.  Neurological:  Positive for numbness. Negative for tremors.       Cauda equina compression-saddle paresthesia with decreased sensation  down legs  Hematological:  Negative for adenopathy. Does not bruise/bleed easily.  Psychiatric/Behavioral:  Positive for decreased concentration. Negative for behavioral problems (Depression), sleep disturbance and suicidal ideas. The patient is not nervous/anxious.    Vital Signs: BP 103/73    Pulse 91    Temp 98.6 F (37 C)    Resp 16    Ht 6' (1.829 m)    Wt 186 lb 9.6 oz (84.6 kg)    SpO2 95%    BMI 25.31 kg/m    Physical Exam Vitals and nursing note reviewed.  Constitutional:      General: He is not in acute distress.    Appearance: He is well-developed and normal weight. He is not diaphoretic.  HENT:     Head: Normocephalic and  atraumatic.     Right Ear: External ear normal.     Left Ear: External ear normal.     Nose: Nose normal.     Mouth/Throat:     Pharynx: No oropharyngeal exudate.  Eyes:     Conjunctiva/sclera: Conjunctivae normal.     Pupils: Pupils are equal, round, and reactive to light.  Neck:     Thyroid: No thyromegaly.     Vascular: No JVD.     Trachea: No tracheal deviation.  Cardiovascular:     Rate and Rhythm: Normal rate and regular rhythm.     Heart sounds: Normal heart sounds. No murmur heard.   No friction rub. No gallop.  Pulmonary:     Effort: Pulmonary effort is normal. No respiratory distress.     Breath sounds: Normal breath sounds. No stridor. No wheezing or rales.  Chest:     Chest wall: No tenderness.  Abdominal:     General: Bowel sounds are normal.     Palpations: Abdomen is soft.  Musculoskeletal:        General: No tenderness or deformity. Normal range of motion.     Cervical back: Normal range of motion and neck supple.     Comments: Limited ROM from back injury  Lymphadenopathy:     Cervical: No cervical adenopathy.  Skin:    General: Skin is warm and dry.     Coloration: Skin is not pale.     Findings: No erythema or rash.  Neurological:     Mental Status: He is alert.     Cranial Nerves: No cranial nerve deficit.      Sensory: Sensory deficit present.     Motor: No abnormal muscle tone.     Coordination: Coordination normal.     Deep Tendon Reflexes: Reflexes are normal and symmetric.  Psychiatric:        Behavior: Behavior normal.        Thought Content: Thought content normal.        Judgment: Judgment normal.       Assessment/Plan: 1. Excessive daytime sleepiness Will stop vyvance and switch to ritalin to start at 10mg  BID. May need to titrate up in future. Will not combine with vyvance and will notify office if any problems with this.  - methylphenidate (RITALIN) 10 MG tablet; Take 1 tablet (10 mg total) by mouth 2 (two) times daily.  Dispense: 60 tablet; Refill: 0 [place] Controlled Substance Database was reviewed by me for overdose risk score (ORS) Refilled Controlled medications today. Reviewed risks and possible side effects associated with taking Stimulants. Combination of these drugs with other psychotropic medications could cause dizziness and drowsiness. Pt needs to Monitor symptoms and exercise caution in driving and operating heavy machinery to avoid damages to oneself, to others and to the surroundings. Patient verbalized understanding in this matter. Dependence and abuse for these drugs will be monitored closely. A Controlled substance policy and procedure is on file which allows [facility] to order a urine drug screen test at any visit. Patient understands and agrees with the plan.  2. Attention and concentration deficit Will stop vyvance and switch to ritalin to start at 10mg  BID. May need to titrate up in future. Will not combine with vyvance and will notify office if any problems with this. - methylphenidate (RITALIN) 10 MG tablet; Take 1 tablet (10 mg total) by mouth 2 (two) times daily.  Dispense: 60 tablet; Refill: 0  3. History of lumbar fusion Followed by  neurosurgery  4. Neuropathic pain Followed by neurosurgery   General Counseling: Benna Dunks understanding  of the findings of todays visit and agrees with plan of treatment. I have discussed any further diagnostic evaluation that may be needed or ordered today. We also reviewed his medications today. he has been encouraged to call the office with any questions or concerns that should arise related to todays visit.    No orders of the defined types were placed in this encounter.   Meds ordered this encounter  Medications   methylphenidate (RITALIN) 10 MG tablet    Sig: Take 1 tablet (10 mg total) by mouth 2 (two) times daily.    Dispense:  60 tablet    Refill:  0    This patient was seen by Lynn Ito, PA-C in collaboration with Dr. Beverely Risen as a part of collaborative care agreement.   Total time spent:30 Minutes Time spent includes review of chart, medications, test results, and follow up plan with the patient.      Dr Lyndon Code Internal medicine

## 2022-01-03 DIAGNOSIS — R339 Retention of urine, unspecified: Secondary | ICD-10-CM | POA: Diagnosis not present

## 2022-01-07 ENCOUNTER — Ambulatory Visit: Payer: BC Managed Care – PPO | Admitting: Physician Assistant

## 2022-01-10 DIAGNOSIS — Z981 Arthrodesis status: Secondary | ICD-10-CM | POA: Diagnosis not present

## 2022-01-17 IMAGING — DX DG HAND COMPLETE 3+V*L*
3 series · 3 of 3 positions shown · non-contrast
Comparison: None.

CLINICAL DATA: Pain after trauma yesterday. Hyperextension is
second through fifth digits.

EXAM:
LEFT HAND - COMPLETE 3+ VIEW

[hand pa]
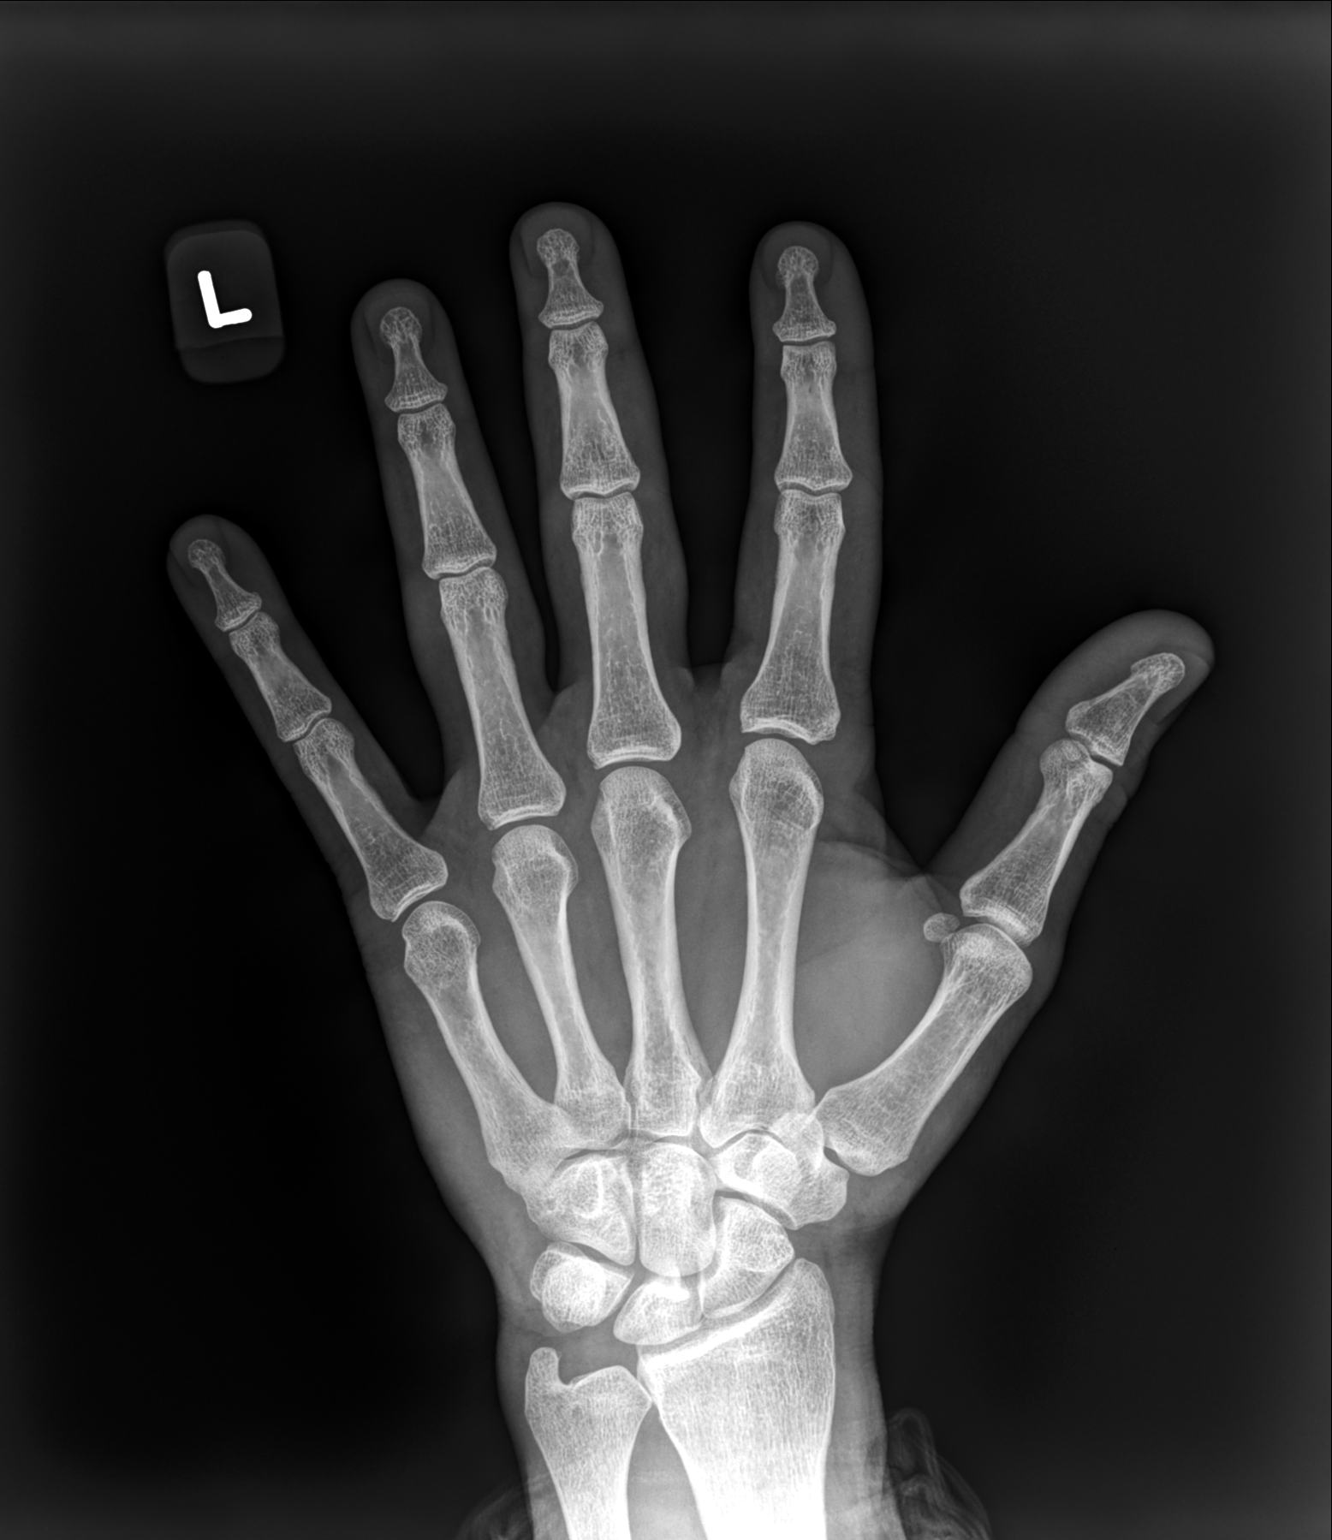

[hand mlo]
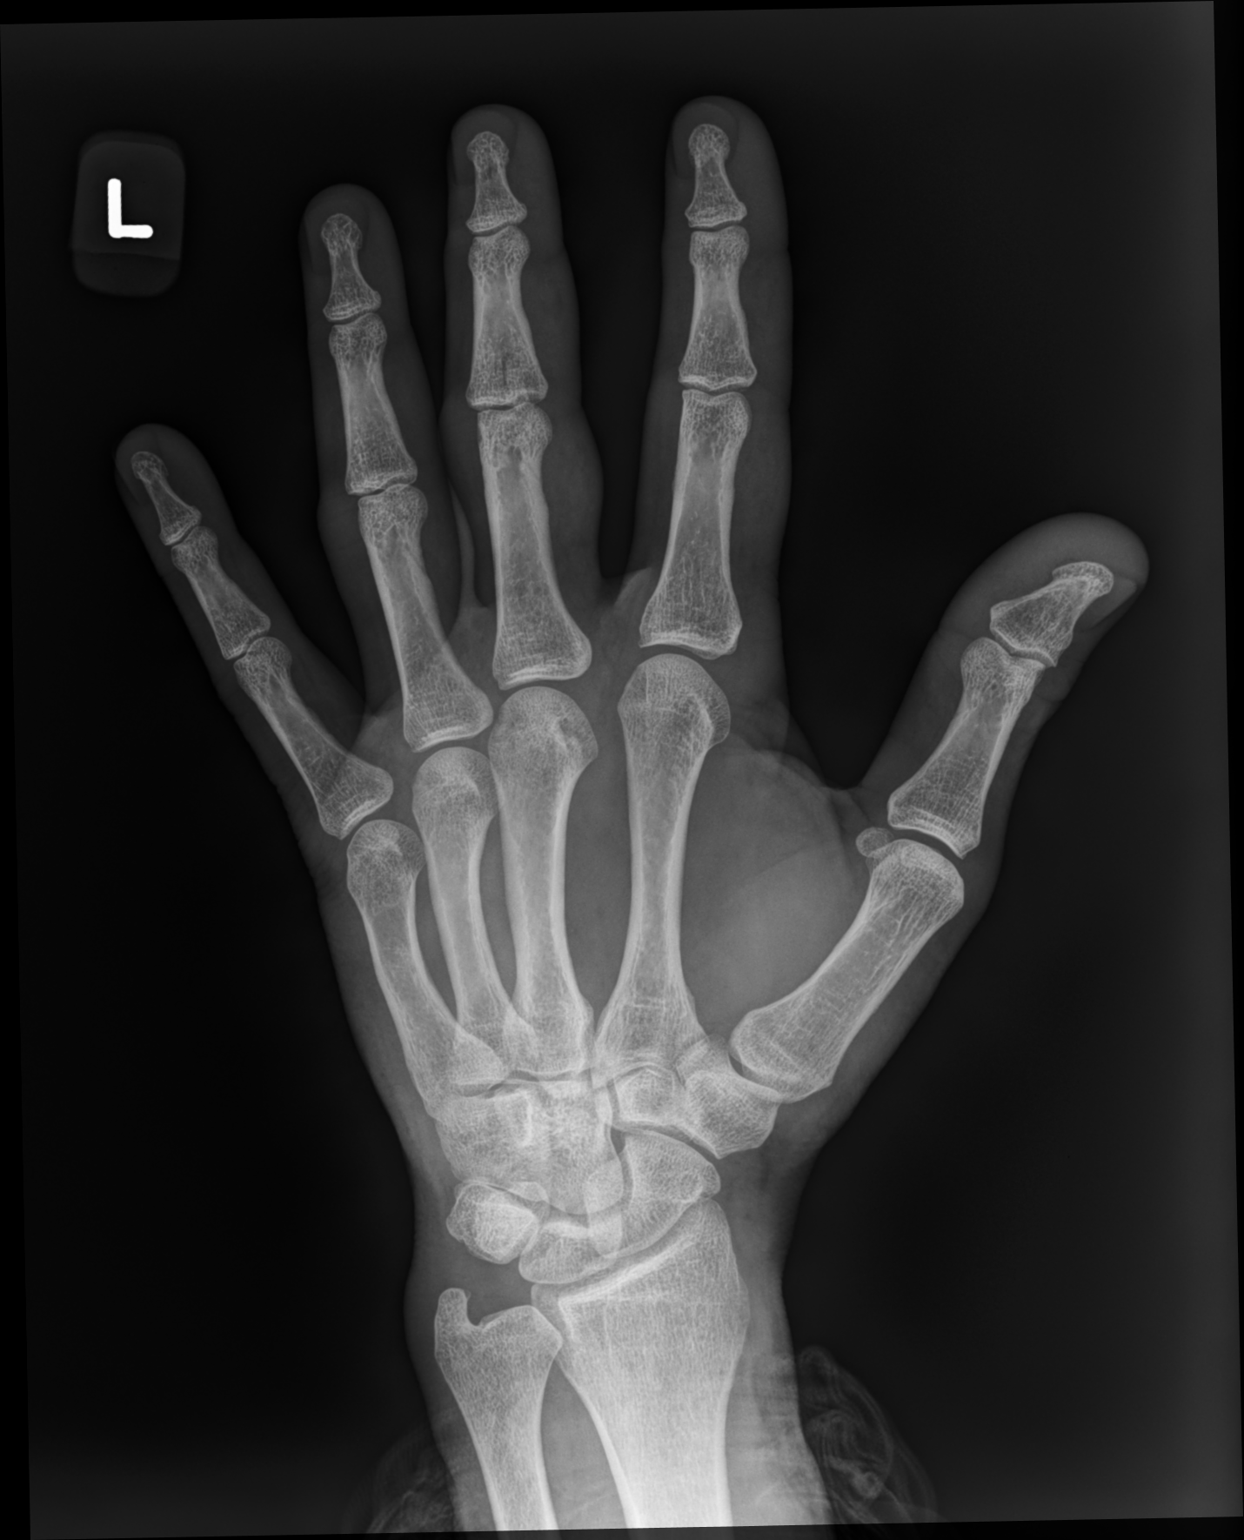

[hand lat]
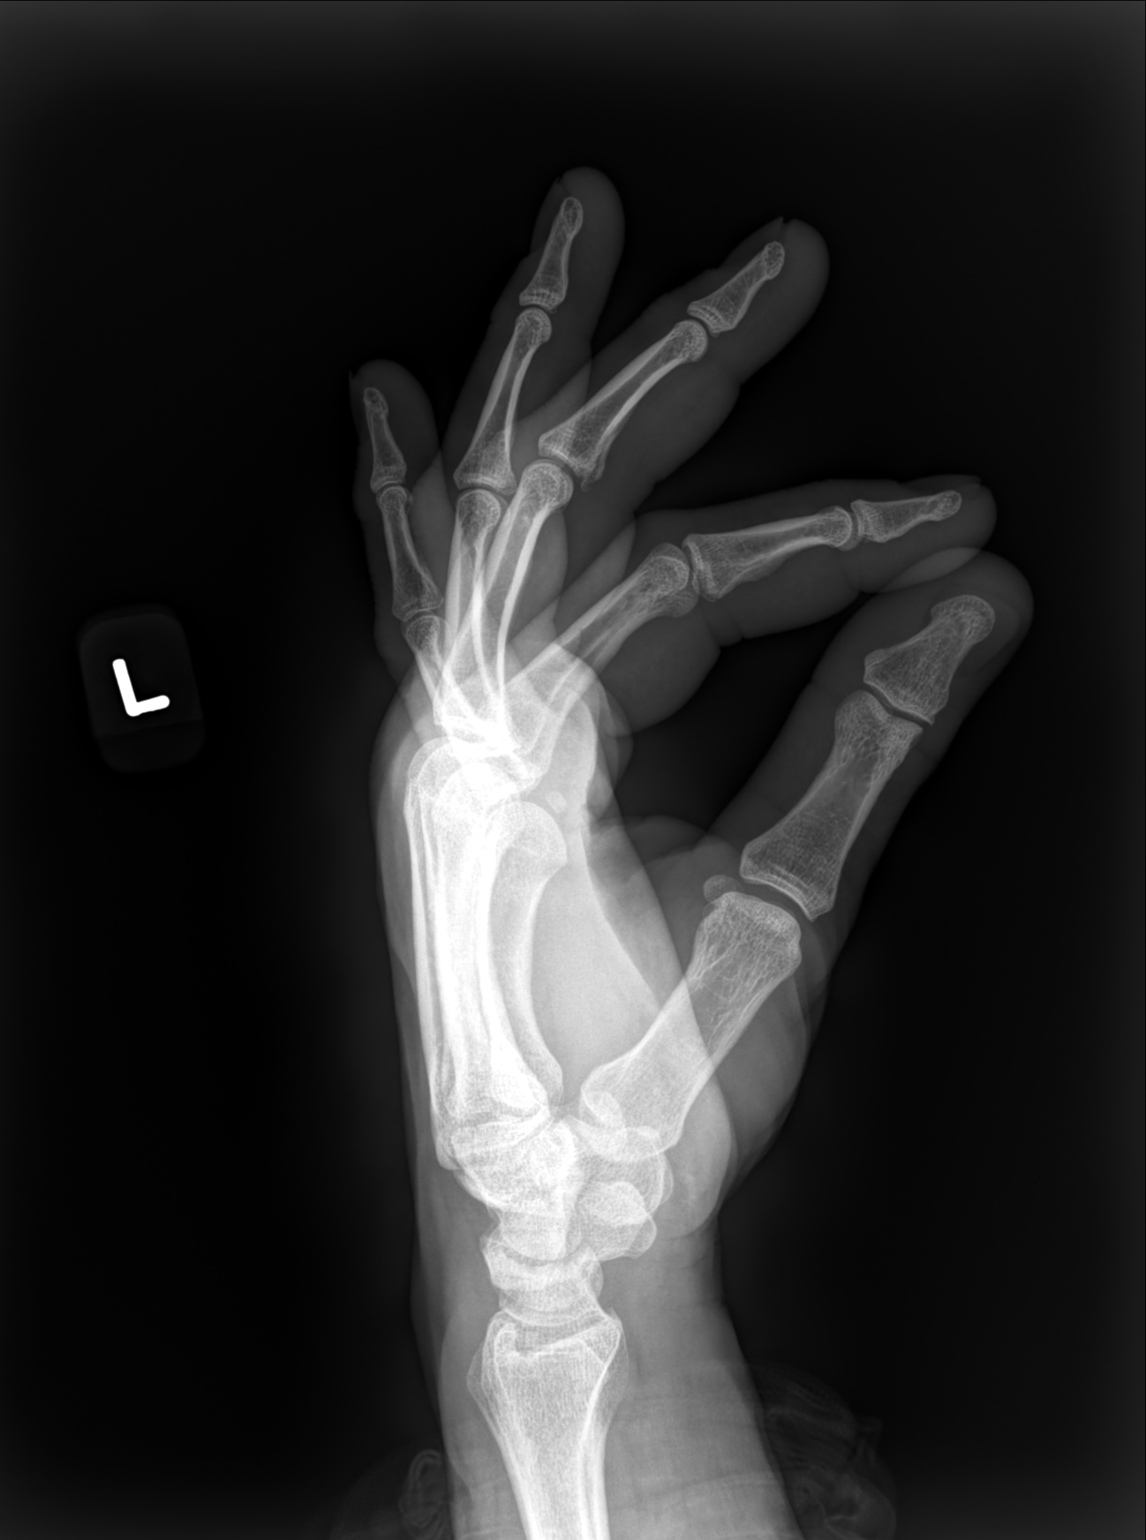

[3 of 3 positions shown; findings below may reference images not displayed]

FINDINGS: Irregularity along the volar aspect of the third middle phalanx
proximally with an associated lucency. This no other abnormalities.
IMPRESSION: Findings are concerning for an unusual acute fracture through the
volar aspect of the middle third phalanx proximally. The volar plate
appears to be involved.

## 2022-01-31 DIAGNOSIS — N3000 Acute cystitis without hematuria: Secondary | ICD-10-CM | POA: Diagnosis not present

## 2022-01-31 DIAGNOSIS — N312 Flaccid neuropathic bladder, not elsewhere classified: Secondary | ICD-10-CM | POA: Diagnosis not present

## 2022-02-10 IMAGING — RF DG THORACOLUMBAR SPINE 2V
1 series · 4 of 4 positions shown · non-contrast
Comparison: None.

CLINICAL DATA: T11-L3 fusion

EXAM:
THORACOLUMBAR SPINE 1V

[Series 1: run · 4 of 4 slices shown]
[im 1/4]
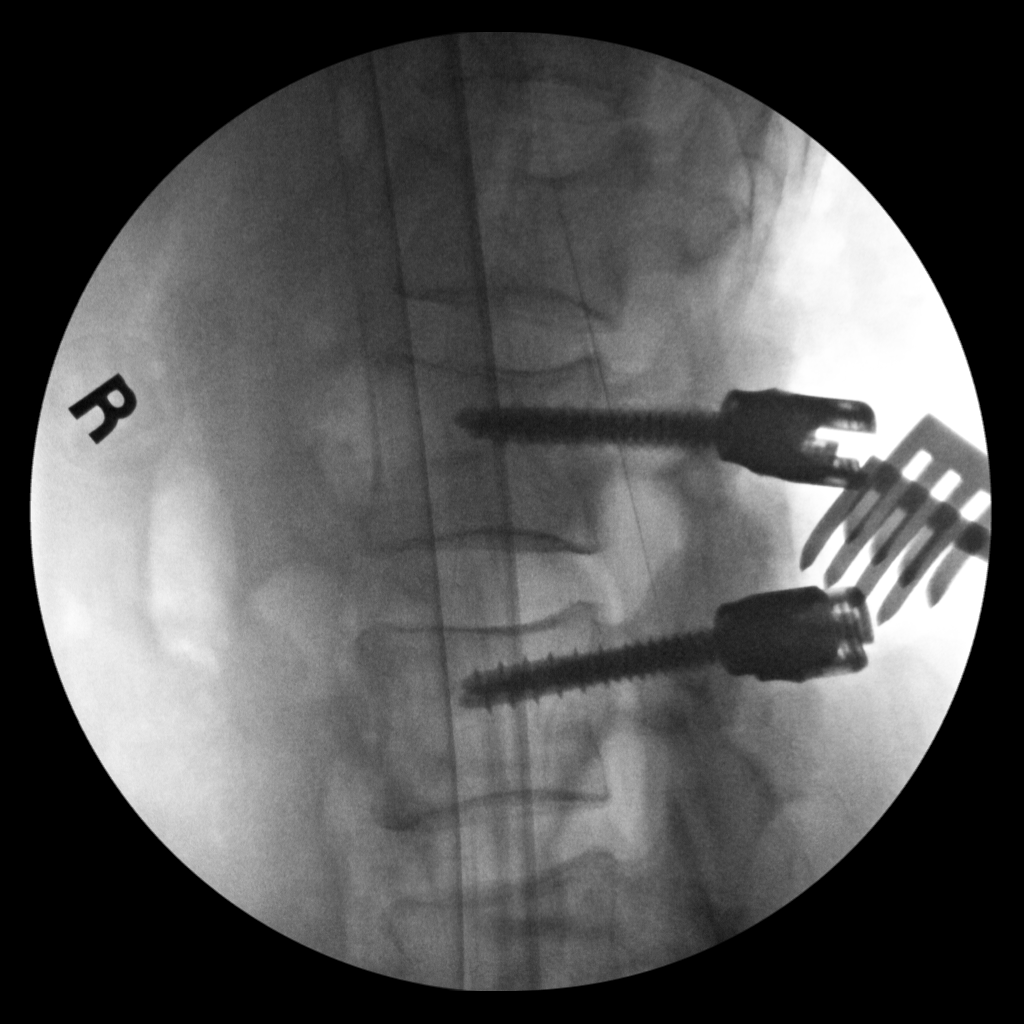
[im 2/4]
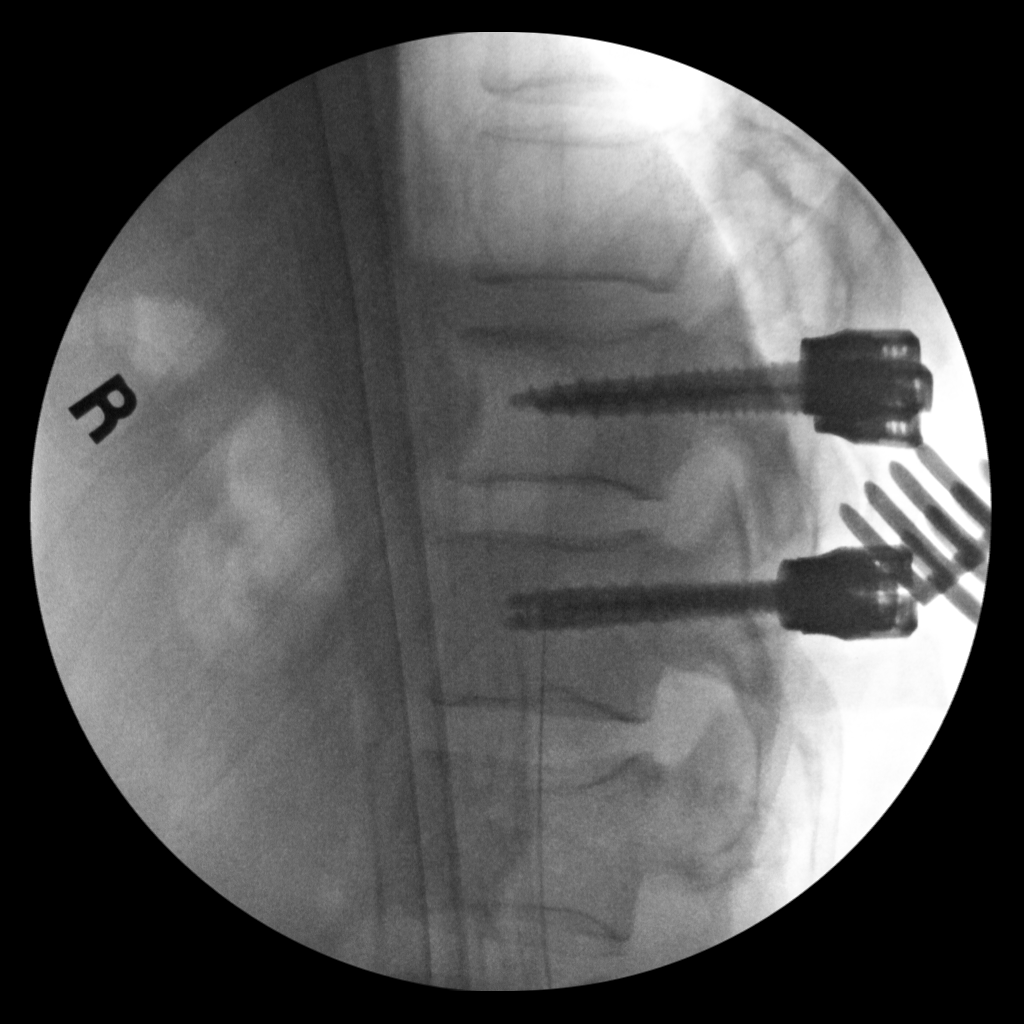
[im 3/4]
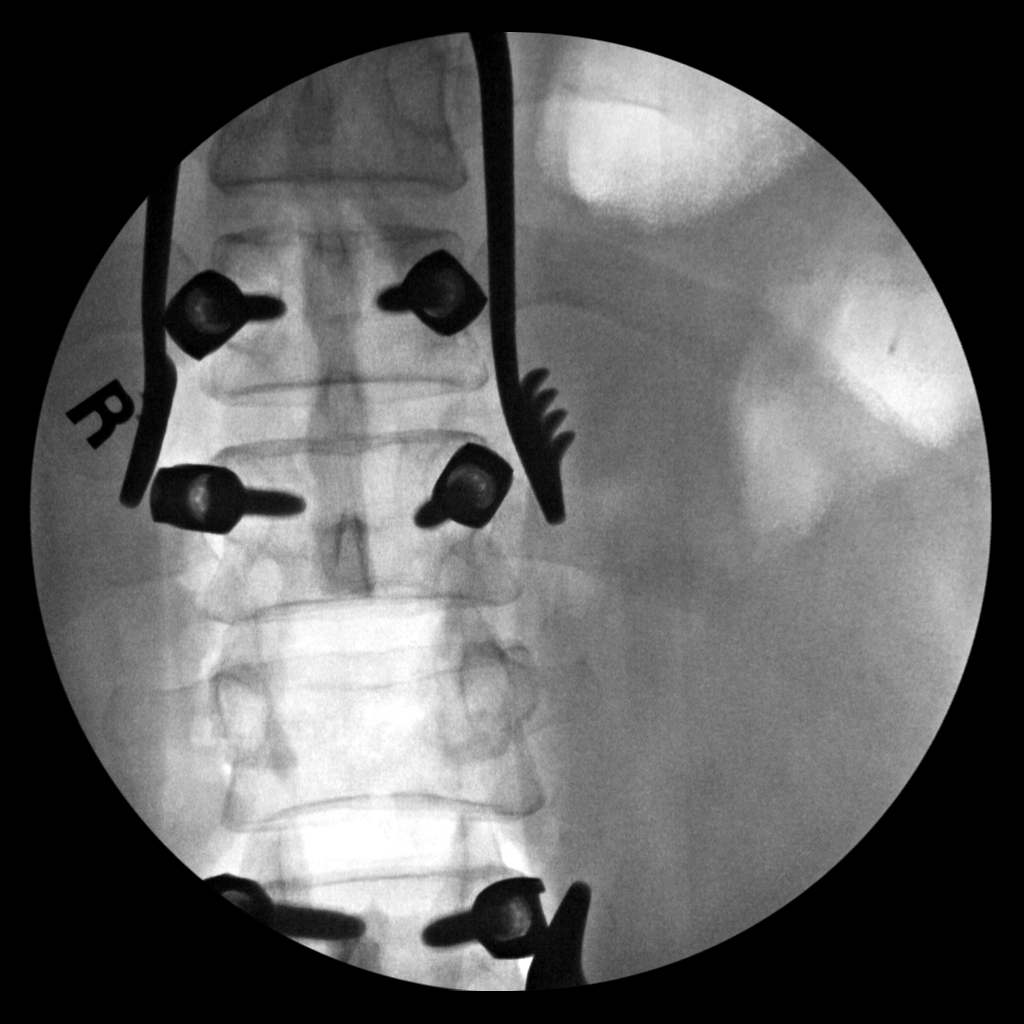
[im 4/4]
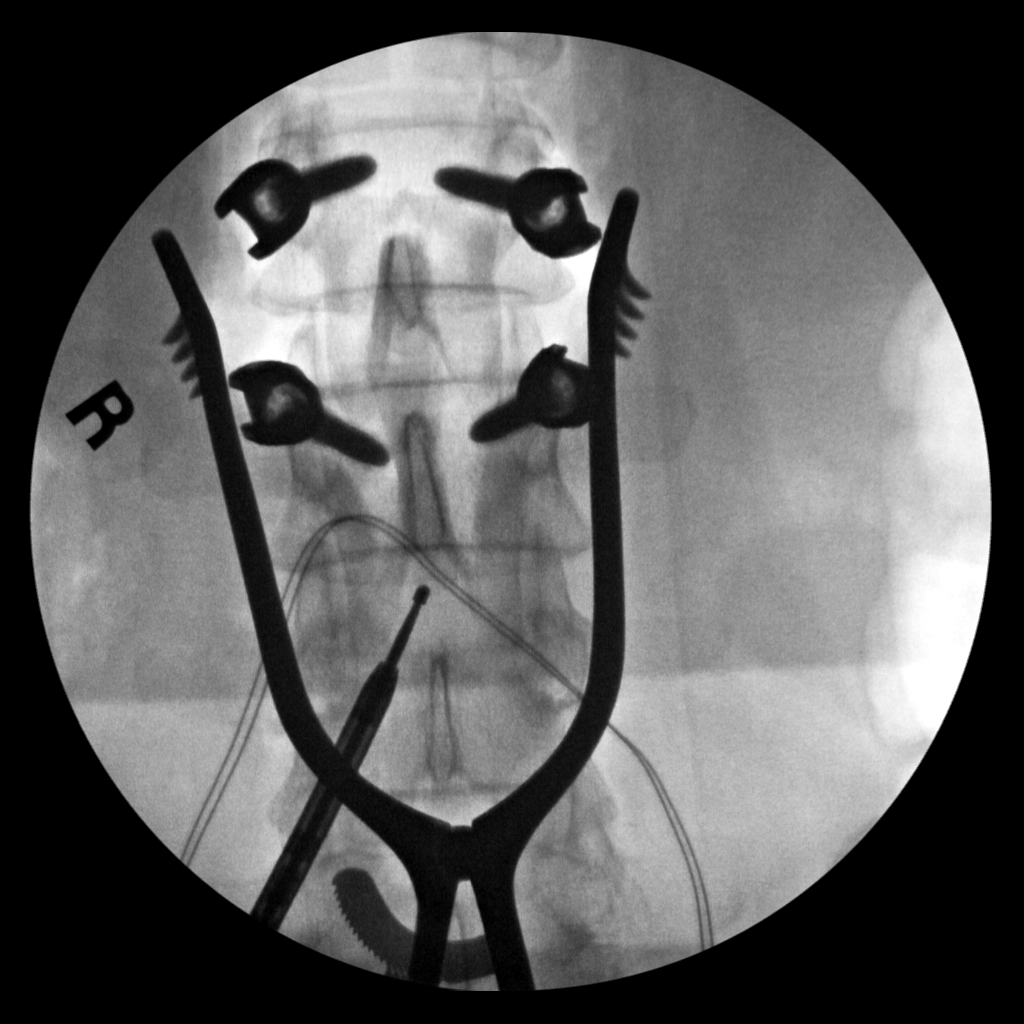

[4 of 4 positions shown; findings below may reference images not displayed]

FINDINGS: Multiple intraoperative spot images demonstrate posterior fusion
changes from T11-L3. Placement of pedicle screws. No visible
complicating feature
IMPRESSION: Intraoperative imaging as above.

## 2022-02-13 DIAGNOSIS — M792 Neuralgia and neuritis, unspecified: Secondary | ICD-10-CM | POA: Diagnosis not present

## 2022-02-13 DIAGNOSIS — Z6825 Body mass index (BMI) 25.0-25.9, adult: Secondary | ICD-10-CM | POA: Diagnosis not present

## 2022-02-13 DIAGNOSIS — M545 Low back pain, unspecified: Secondary | ICD-10-CM | POA: Diagnosis not present

## 2022-02-13 DIAGNOSIS — M5416 Radiculopathy, lumbar region: Secondary | ICD-10-CM | POA: Diagnosis not present

## 2022-02-13 DIAGNOSIS — F1120 Opioid dependence, uncomplicated: Secondary | ICD-10-CM | POA: Diagnosis not present

## 2022-02-15 DIAGNOSIS — N312 Flaccid neuropathic bladder, not elsewhere classified: Secondary | ICD-10-CM | POA: Diagnosis not present

## 2022-02-25 ENCOUNTER — Telehealth: Payer: Self-pay

## 2022-02-25 ENCOUNTER — Other Ambulatory Visit: Payer: Self-pay | Admitting: Physician Assistant

## 2022-02-25 DIAGNOSIS — R41840 Attention and concentration deficit: Secondary | ICD-10-CM

## 2022-02-25 DIAGNOSIS — G4719 Other hypersomnia: Secondary | ICD-10-CM

## 2022-02-25 MED ORDER — AMPHETAMINE-DEXTROAMPHETAMINE 20 MG PO TABS: 20.0000 mg | ORAL_TABLET | Freq: Two times a day (BID) | ORAL | 0 refills | Status: DC

## 2022-02-25 MED ORDER — AMPHETAMINE-DEXTROAMPHETAMINE 20 MG PO TABS: 20.0000 mg | ORAL_TABLET | Freq: Two times a day (BID) | ORAL | 0 refills | Status: DC | PRN

## 2022-02-25 NOTE — Progress Notes (Signed)
Refill adderall sent for 2 months, must keep June appt for further refills ?

## 2022-02-25 NOTE — Telephone Encounter (Signed)
LMOM for wife that Lauren sent in rx for Adderall to his pharmacy with a refill and that he needs to keep his appointment in June.  Informed him/her to call back if any questions ?

## 2022-02-27 DIAGNOSIS — R339 Retention of urine, unspecified: Secondary | ICD-10-CM | POA: Diagnosis not present

## 2022-03-26 ENCOUNTER — Other Ambulatory Visit: Payer: Self-pay | Admitting: Physician Assistant

## 2022-03-26 DIAGNOSIS — R41840 Attention and concentration deficit: Secondary | ICD-10-CM

## 2022-03-26 DIAGNOSIS — G4719 Other hypersomnia: Secondary | ICD-10-CM

## 2022-04-08 DIAGNOSIS — R339 Retention of urine, unspecified: Secondary | ICD-10-CM | POA: Diagnosis not present

## 2022-04-11 ENCOUNTER — Telehealth: Payer: Self-pay

## 2022-04-11 NOTE — Telephone Encounter (Signed)
Left vm and sent mychart message to confirm 04/18/22 appointment-Toni 

## 2022-04-18 ENCOUNTER — Ambulatory Visit (INDEPENDENT_AMBULATORY_CARE_PROVIDER_SITE_OTHER): Payer: BC Managed Care – PPO | Admitting: Physician Assistant

## 2022-04-18 ENCOUNTER — Encounter: Payer: Self-pay | Admitting: Physician Assistant

## 2022-04-18 VITALS — BP 101/72 | HR 88 | Temp 98.0°F | Resp 16 | Ht 72.0 in | Wt 188.0 lb

## 2022-04-18 DIAGNOSIS — M792 Neuralgia and neuritis, unspecified: Secondary | ICD-10-CM

## 2022-04-18 DIAGNOSIS — N390 Urinary tract infection, site not specified: Secondary | ICD-10-CM

## 2022-04-18 DIAGNOSIS — G4719 Other hypersomnia: Secondary | ICD-10-CM | POA: Diagnosis not present

## 2022-04-18 DIAGNOSIS — R41840 Attention and concentration deficit: Secondary | ICD-10-CM

## 2022-04-18 DIAGNOSIS — J069 Acute upper respiratory infection, unspecified: Secondary | ICD-10-CM | POA: Diagnosis not present

## 2022-04-18 DIAGNOSIS — R7989 Other specified abnormal findings of blood chemistry: Secondary | ICD-10-CM

## 2022-04-18 DIAGNOSIS — E782 Mixed hyperlipidemia: Secondary | ICD-10-CM

## 2022-04-18 DIAGNOSIS — R5383 Other fatigue: Secondary | ICD-10-CM

## 2022-04-18 DIAGNOSIS — Z0001 Encounter for general adult medical examination with abnormal findings: Secondary | ICD-10-CM

## 2022-04-18 DIAGNOSIS — R300 Dysuria: Secondary | ICD-10-CM

## 2022-04-18 DIAGNOSIS — Z79899 Other long term (current) drug therapy: Secondary | ICD-10-CM

## 2022-04-18 LAB — POCT URINALYSIS DIPSTICK
Bilirubin, UA: NEGATIVE
Blood, UA: NEGATIVE
Glucose, UA: NEGATIVE
Ketones, UA: NEGATIVE
Leukocytes, UA: NEGATIVE
Nitrite, UA: POSITIVE
Protein, UA: NEGATIVE
Spec Grav, UA: 1.01 (ref 1.010–1.025)
Urobilinogen, UA: 0.2 E.U./dL
pH, UA: 6 (ref 5.0–8.0)

## 2022-04-18 LAB — POCT URINE DRUG SCREEN
POC Amphetamine UR: POSITIVE — AB
POC BENZODIAZEPINES UR: NOT DETECTED
POC Barbiturate UR: NOT DETECTED
POC Cocaine UR: NOT DETECTED
POC Ecstasy UR: NOT DETECTED
POC Marijuana UR: NOT DETECTED
POC Methadone UR: NOT DETECTED
POC Methamphetamine UR: NOT DETECTED
POC Opiate Ur: POSITIVE — AB
POC Oxycodone UR: POSITIVE — AB
POC PHENCYCLIDINE UR: NOT DETECTED
POC TRICYCLICS UR: NOT DETECTED

## 2022-04-18 MED ORDER — AMPHETAMINE-DEXTROAMPHETAMINE 20 MG PO TABS: 20.0000 mg | ORAL_TABLET | Freq: Two times a day (BID) | ORAL | 0 refills | Status: DC

## 2022-04-18 MED ORDER — DOXYCYCLINE HYCLATE 100 MG PO TABS: 100.0000 mg | ORAL_TABLET | Freq: Two times a day (BID) | ORAL | 0 refills | Status: DC

## 2022-04-18 MED ORDER — AMPHETAMINE-DEXTROAMPHETAMINE 20 MG PO TABS: 20.0000 mg | ORAL_TABLET | Freq: Two times a day (BID) | ORAL | 0 refills | Status: DC | PRN

## 2022-04-18 NOTE — Progress Notes (Signed)
[facility] [place], [place] [address]  Internal MEDICINE  Office Visit Note  Patient Name: Kevin Whitehead  [id number]  [id number]  Date of Service: 04/18/2022  Chief Complaint  Patient presents with   Annual Exam   Sinus Problem     HPI Pt is here for routine health maintenance examination -Has been using allergy medication and nasal spray. Has been trying to cough up something that he feels in his chest. Has been going on for about a month. He is not having much sinus symptoms now and seems to be more of a nagging cough/throat clearing that he states doesn't bother him and is just annoying at this point. Did feel a little warm over the weekend but that resolved. Took some medication for UTI in case it was due to this since he is prone to these from catheter and cant always feel if something is going on. -Taking adderall BID and is doing well with this. Denies any side effects. Due for refills -he continues to be followed by neurosurgeon q79months and pain management q38months -seeing urology as well  -overdue for routine fasting labs  Current Medication: Outpatient Encounter Medications as of 04/18/2022  Medication Sig Note   acetaminophen (TYLENOL) 325 MG tablet Take 2 tablets (650 mg total) by mouth every 6 (six) hours as needed for mild pain (or Fever >/= 101).    [START ON 04/25/2022] amphetamine-dextroamphetamine (ADDERALL) 20 MG tablet Take 1 tablet (20 mg total) by mouth 2 (two) times daily.    Ascorbic Acid (VITAMIN C) 1000 MG tablet Take 1,000 mg by mouth daily.    bethanechol (URECHOLINE) 25 MG tablet Take 1 tablet (25 mg total) by mouth 3 (three) times daily.    bisacodyl (DULCOLAX) 10 MG suppository Place 1 suppository (10 mg total) rectally daily after supper.    buPROPion ER (WELLBUTRIN SR) 100 MG 12 hr tablet TAKE 1 TABLET BY MOUTH EVERY DAY    BuPROPion HBr (APLENZIN) 174 MG TB24 Take by mouth.    cyanocobalamin 1000 MCG tablet Take 1,000  mcg by mouth daily. 5,000 mcg    diazepam (VALIUM) 5 MG tablet TAKE 1-2 TABLETS (5-10 MG TOTAL) BY MOUTH AT BEDTIME AS NEEDED FOR MUSCLE SPASMS.    docusate sodium (COLACE) 100 MG capsule Take 100 mg by mouth daily.    doxycycline (VIBRA-TABS) 100 MG tablet Take 1 tablet (100 mg total) by mouth 2 (two) times daily.    DULoxetine (CYMBALTA) 60 MG capsule Take 1 capsule (60 mg total) by mouth at bedtime.    fluticasone (FLONASE) 50 MCG/ACT nasal spray Place 1 spray into both nostrils daily as needed for allergies or rhinitis.    hydrocortisone (ANUSOL-HC) 25 MG suppository PLACE ONE SUPPOSITORY RECTALLY TWO TIMES DAILY    meloxicam (MOBIC) 15 MG tablet Take by mouth.    Multiple Vitamin (MULTIVITAMIN) capsule Take 1 capsule by mouth daily.    nabumetone (RELAFEN) 500 MG tablet Take 1 tablet (500 mg total) by mouth 2 (two) times daily.    oxyCODONE 10 MG TABS Take 1 tablet (10 mg total) by mouth every 3 (three) hours as needed for moderate pain. 12/06/2020: #50 on 11/28/20 #12 today LD 12/06/20   polyethylene glycol (MIRALAX / GLYCOLAX) 17 g packet Take 17 g by mouth daily.    sildenafil (VIAGRA) 100 MG tablet Take 100 mg by mouth as needed.    tamsulosin (FLOMAX) 0.4 MG CAPS capsule TAKE 2 CAPSULES (0.8 MG TOTAL) BY  MOUTH DAILY AFTER SUPPER.    [DISCONTINUED] amphetamine-dextroamphetamine (ADDERALL) 20 MG tablet Take 1 tablet (20 mg total) by mouth 2 (two) times daily as needed.    [DISCONTINUED] amphetamine-dextroamphetamine (ADDERALL) 20 MG tablet Take 1 tablet (20 mg total) by mouth 2 (two) times daily.    [START ON 06/23/2022] amphetamine-dextroamphetamine (ADDERALL) 20 MG tablet Take 1 tablet (20 mg total) by mouth 2 (two) times daily as needed.    [START ON 05/24/2022] amphetamine-dextroamphetamine (ADDERALL) 20 MG tablet Take 1 tablet (20 mg total) by mouth 2 (two) times daily.    No facility-administered encounter medications on file as of 04/18/2022.    Surgical History: Past Surgical History:   Procedure Laterality Date   ANKLE SURGERY  2016   POSTERIOR LUMBAR FUSION 4 LEVEL N/A 10/30/2020   Procedure: POSTERIOR LUMBAR FUSION 4 LEVEL;  Surgeon: Earnie Larsson, MD;  Location: [facility];  Service: Neurosurgery;  Laterality: N/A;  Lumbar one decompressive laminectomy with transpedicular decompression, Thoracic eleven through Lumbar three posterior lateral arthrodesis utilizing pedicle screw fixation and local autografting    Medical History: Past Medical History:  Diagnosis Date   Attention and concentration deficit    Hemorrhoid     Family History: Family History  Problem Relation Age of Onset   COPD Father       Review of Systems  Constitutional:  Positive for fatigue. Negative for chills and unexpected weight change.  HENT:  Positive for postnasal drip. Negative for congestion, rhinorrhea, sneezing and sore throat.   Eyes:  Negative for redness.  Respiratory:  Positive for cough. Negative for chest tightness, shortness of breath and wheezing.   Cardiovascular:  Negative for chest pain and palpitations.  Gastrointestinal:  Negative for abdominal pain, constipation, diarrhea, nausea and vomiting.  Genitourinary:  Positive for difficulty urinating. Negative for dysuria and frequency.       Self catheterization   Musculoskeletal:  Positive for arthralgias and back pain. Negative for joint swelling and neck pain.  Skin:  Negative for rash.  Neurological:  Positive for numbness. Negative for tremors.       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 Does not bruise/bleed easily.  Psychiatric/Behavioral:  Positive for decreased concentration. Negative for behavioral problems (Depression), sleep disturbance and suicidal ideas. The patient is not nervous/anxious.     Vital Signs: BP 101/72   Pulse 88   Temp 98 F (36.7 C)   Resp 16   Ht 6' (1.829 m)   Wt 188 lb (85.3 kg)   SpO2 97%   BMI 25.50  kg/m    Physical Exam Vitals and nursing note reviewed.  Constitutional:      General: He is not in acute distress.    Appearance: He is well-developed and normal weight. He is not diaphoretic.  HENT:     Head: Normocephalic and atraumatic.     Right Ear: External ear normal.     Left Ear: External ear normal.     Nose: Nose normal.     Mouth/Throat:     Pharynx: No oropharyngeal exudate.  Eyes:     Conjunctiva/sclera: Conjunctivae normal.     Pupils: Pupils are equal, round, and reactive to light.  Neck:     Thyroid: No thyromegaly.     Vascular: No JVD.     Trachea: No tracheal deviation.  Cardiovascular:     Rate and Rhythm: Normal rate and regular rhythm.     Heart sounds: Normal  heart sounds. No murmur heard.   No friction rub. No gallop.  Pulmonary:     Effort: Pulmonary effort is normal. No respiratory distress.     Breath sounds: Normal breath sounds. No stridor. No wheezing or rales.  Chest:     Chest wall: No tenderness.  Abdominal:     General: Bowel sounds are normal.     Palpations: Abdomen is soft.  Musculoskeletal:        General: No tenderness or deformity. Normal range of motion.     Cervical back: Normal range of motion and neck supple.     Comments: Limited ROM from back injury  Lymphadenopathy:     Cervical: No cervical adenopathy.  Skin:    General: Skin is warm and dry.     Coloration: Skin is not pale.     Findings: No erythema or rash.  Neurological:     Mental Status: He is alert.     Cranial Nerves: No cranial nerve deficit.     Sensory: Sensory deficit present.     Motor: No abnormal muscle tone.     Coordination: Coordination normal.     Deep Tendon Reflexes: Reflexes are normal and symmetric.  Psychiatric:        Behavior: Behavior normal.        Thought Content: Thought content normal.        Judgment: Judgment normal.     LABS: Recent Results (from the past 2160 hour(s))  POCT Urine Drug Screen     Status: Abnormal    Collection Time: 04/18/22 10:24 AM  Result Value Ref Range   POC Methamphetamine UR None Detected None Detected   POC Opiate Ur Positive (A) None Detected   POC Barbiturate UR None Detected None Detected   POC Amphetamine UR Positive (A) None Detected   POC Oxycodone UR Positive (A) None Detected   POC Cocaine UR None Detected None Detected   POC Ecstasy UR None Detected None Detected   POC TRICYCLICS UR None Detected None Detected   POC PHENCYCLIDINE UR None Detected None Detected   POC Marijuana UR None Detected None Detected   POC Methadone UR None Detected None Detected   POC BENZODIAZEPINES UR None Detected None Detected   URINE TEMPERATURE     POC DRUG SCREEN OXIDANTS URINE     POC SPECIFIC GRAVITY URINE     POC PH URINE     Methylenedioxyamphetamine    POCT Urinalysis Dipstick     Status: None   Collection Time: 04/18/22 10:59 AM  Result Value Ref Range   Color, UA     Clarity, UA     Glucose, UA Negative Negative   Bilirubin, UA Negative    Ketones, UA Negative    Spec Grav, UA 1.010 1.010 - 1.025   Blood, UA Negative    pH, UA 6.0 5.0 - 8.0   Protein, UA Negative Negative   Urobilinogen, UA 0.2 0.2 or 1.0 E.U./dL   Nitrite, UA Positive    Leukocytes, UA Negative Negative   Appearance     Odor          Assessment/Plan: 1. Encounter for general adult medical examination with abnormal findings CPE performed, routine fasting labs ordered  2. Attention and concentration deficit May continue on Adderall twice a day as needed - amphetamine-dextroamphetamine (ADDERALL) 20 MG tablet; Take 1 tablet (20 mg total) by mouth 2 (two) times daily as needed.  Dispense: 60 tablet; Refill: 0 - amphetamine-dextroamphetamine (ADDERALL)  20 MG tablet; Take 1 tablet (20 mg total) by mouth 2 (two) times daily.  Dispense: 60 tablet; Refill: 0 - amphetamine-dextroamphetamine (ADDERALL) 20 MG tablet; Take 1 tablet (20 mg total) by mouth 2 (two) times daily.  Dispense: 60 tablet;  Refill: 0 [place] Controlled Substance Database was reviewed by me for overdose risk score (ORS) Refilled Controlled medications today. Reviewed risks and possible side effects associated with taking Stimulants. Combination of these drugs with other psychotropic medications could cause dizziness and drowsiness. Pt needs to Monitor symptoms and exercise caution in driving and operating heavy machinery to avoid damages to oneself, to others and to the surroundings. Patient verbalized understanding in this matter. Dependence and abuse for these drugs will be monitored closely. A Controlled substance policy and procedure is on file which allows [facility] to order a urine drug screen test at any visit. Patient understands and agrees with the plan..  3. Excessive daytime sleepiness May continue Adderall twice a day as before - amphetamine-dextroamphetamine (ADDERALL) 20 MG tablet; Take 1 tablet (20 mg total) by mouth 2 (two) times daily as needed.  Dispense: 60 tablet; Refill: 0 - amphetamine-dextroamphetamine (ADDERALL) 20 MG tablet; Take 1 tablet (20 mg total) by mouth 2 (two) times daily.  Dispense: 60 tablet; Refill: 0 - amphetamine-dextroamphetamine (ADDERALL) 20 MG tablet; Take 1 tablet (20 mg total) by mouth 2 (two) times daily.  Dispense: 60 tablet; Refill: 0  4. Neuropathic pain Followed by neurosurgery and pain management  5. Upper respiratory tract infection, unspecified type Will start on doxycycline due to persistence of cough as well as possible UTI.  If not improving with antibiotics may need chest x-ray for further evaluation.  Can continue antihistamine and nasal spray as well as start on Mucinex - doxycycline (VIBRA-TABS) 100 MG tablet; Take 1 tablet (100 mg total) by mouth 2 (two) times daily.  Dispense: 14 tablet; Refill: 0  6. Encounter for long-term (current) use of high-risk medication - POCT Urine Drug Screen+ opiate, oxycodone, amphetamines as is expected based on his  current medications  7. Other fatigue - TSH + free T4 - Lipid Panel With LDL/HDL Ratio - Comprehensive metabolic panel - CBC w/Diff/Platelet  8. Abnormal thyroid blood test - TSH + free T4  9. Mixed hyperlipidemia - Lipid Panel With LDL/HDL Ratio  10. Urinary tract infection without hematuria, site unspecified We will go ahead and start on doxycycline for upper respiratory infection as well as dual coverage of possible UTI.  Will adjust based on C/S if indicated - CULTURE, URINE COMPREHENSIVE  11. Dysuria - POCT Urinalysis Dipstick   General Counseling: Mana verbalizes understanding of the findings of todays visit and agrees with plan of treatment. I have discussed any further diagnostic evaluation that may be needed or ordered today. We also reviewed his medications today. he has been encouraged to call the office with any questions or concerns that should arise related to todays visit.    Counseling:    Orders Placed This Encounter  Procedures   CULTURE, URINE COMPREHENSIVE   TSH + free T4   Lipid Panel With LDL/HDL Ratio   Comprehensive metabolic panel   CBC w/Diff/Platelet   POCT Urine Drug Screen   POCT Urinalysis Dipstick    Meds ordered this encounter  Medications   amphetamine-dextroamphetamine (ADDERALL) 20 MG tablet    Sig: Take 1 tablet (20 mg total) by mouth 2 (two) times daily as needed.    Dispense:  60 tablet    Refill:  0   amphetamine-dextroamphetamine (ADDERALL) 20 MG tablet    Sig: Take 1 tablet (20 mg total) by mouth 2 (two) times daily.    Dispense:  60 tablet    Refill:  0   amphetamine-dextroamphetamine (ADDERALL) 20 MG tablet    Sig: Take 1 tablet (20 mg total) by mouth 2 (two) times daily.    Dispense:  60 tablet    Refill:  0   doxycycline (VIBRA-TABS) 100 MG tablet    Sig: Take 1 tablet (100 mg total) by mouth 2 (two) times daily.    Dispense:  14 tablet    Refill:  0    This patient was seen by Drema Dallas, PA-C in  collaboration with Dr. Clayborn Bigness as a part of collaborative care agreement.  Total time spent:35 Minutes  Time spent includes review of chart, medications, test results, and follow up plan with the patient.     Lavera Guise, MD  Internal Medicine

## 2022-04-21 LAB — CULTURE, URINE COMPREHENSIVE

## 2022-04-24 ENCOUNTER — Telehealth: Payer: Self-pay

## 2022-04-24 NOTE — Telephone Encounter (Signed)
-----   Message from Mylinda Latina, PA-C sent at 04/22/2022  9:09 AM EDT ----- Please let him know that his culture did show he had a UTI and that the medication he is on already should cover it.

## 2022-04-24 NOTE — Telephone Encounter (Signed)
Left message for patient regarding urine culture results on 04/24/2022.

## 2022-05-01 DIAGNOSIS — F1120 Opioid dependence, uncomplicated: Secondary | ICD-10-CM | POA: Diagnosis not present

## 2022-05-01 DIAGNOSIS — M545 Low back pain, unspecified: Secondary | ICD-10-CM | POA: Diagnosis not present

## 2022-05-01 DIAGNOSIS — M792 Neuralgia and neuritis, unspecified: Secondary | ICD-10-CM | POA: Diagnosis not present

## 2022-05-01 DIAGNOSIS — E782 Mixed hyperlipidemia: Secondary | ICD-10-CM | POA: Diagnosis not present

## 2022-05-01 DIAGNOSIS — R5383 Other fatigue: Secondary | ICD-10-CM | POA: Diagnosis not present

## 2022-05-01 DIAGNOSIS — R7989 Other specified abnormal findings of blood chemistry: Secondary | ICD-10-CM | POA: Diagnosis not present

## 2022-05-02 LAB — COMPREHENSIVE METABOLIC PANEL
ALT: 14 IU/L (ref 0–44)
AST: 14 IU/L (ref 0–40)
Albumin/Globulin Ratio: 2.2 (ref 1.2–2.2)
Albumin: 4.8 g/dL (ref 4.0–5.0)
Alkaline Phosphatase: 113 IU/L (ref 44–121)
BUN/Creatinine Ratio: 17 (ref 9–20)
BUN: 14 mg/dL (ref 6–24)
Bilirubin Total: 1 mg/dL (ref 0.0–1.2)
CO2: 24 mmol/L (ref 20–29)
Calcium: 8.1 mg/dL — ABNORMAL LOW (ref 8.7–10.2)
Chloride: 101 mmol/L (ref 96–106)
Creatinine, Ser: 0.82 mg/dL (ref 0.76–1.27)
Globulin, Total: 2.2 g/dL (ref 1.5–4.5)
Glucose: 89 mg/dL (ref 70–99)
Potassium: 4.6 mmol/L (ref 3.5–5.2)
Sodium: 140 mmol/L (ref 134–144)
Total Protein: 7 g/dL (ref 6.0–8.5)
eGFR: 112 mL/min/{1.73_m2} (ref >59)

## 2022-05-02 LAB — CBC WITH DIFFERENTIAL/PLATELET
Basophils Absolute: 0 10*3/uL (ref 0.0–0.2)
Basos: 1 %
EOS (ABSOLUTE): 0.2 10*3/uL (ref 0.0–0.4)
Eos: 3 %
Hematocrit: 38.3 % (ref 37.5–51.0)
Hemoglobin: 13.3 g/dL (ref 13.0–17.7)
Immature Grans (Abs): 0 10*3/uL (ref 0.0–0.1)
Immature Granulocytes: 0 %
Lymphocytes Absolute: 1.3 10*3/uL (ref 0.7–3.1)
Lymphs: 26 %
MCH: 30.4 pg (ref 26.6–33.0)
MCHC: 34.7 g/dL (ref 31.5–35.7)
MCV: 87 fL (ref 79–97)
Monocytes Absolute: 0.6 10*3/uL (ref 0.1–0.9)
Monocytes: 13 %
Neutrophils Absolute: 2.9 10*3/uL (ref 1.4–7.0)
Neutrophils: 57 %
Platelets: 275 10*3/uL (ref 150–450)
RBC: 4.38 x10E6/uL (ref 4.14–5.80)
RDW: 12.5 % (ref 11.6–15.4)
WBC: 5.1 10*3/uL (ref 3.4–10.8)

## 2022-05-02 LAB — TSH+FREE T4
Free T4: 1.28 ng/dL (ref 0.82–1.77)
TSH: 0.448 u[IU]/mL — ABNORMAL LOW (ref 0.450–4.500)

## 2022-05-02 LAB — LIPID PANEL WITH LDL/HDL RATIO
Cholesterol, Total: 147 mg/dL (ref 100–199)
HDL: 41 mg/dL (ref >39)
LDL Chol Calc (NIH): 92 mg/dL (ref 0–99)
LDL/HDL Ratio: 2.2 ratio (ref 0.0–3.6)
Triglycerides: 68 mg/dL (ref 0–149)
VLDL Cholesterol Cal: 14 mg/dL (ref 5–40)

## 2022-05-07 ENCOUNTER — Telehealth: Payer: Self-pay

## 2022-05-07 NOTE — Telephone Encounter (Signed)
-----   Message from Carlean Jews, PA-C sent at 05/07/2022  9:31 AM EDT ----- Please let him know that his calcium is low and should supplement OTC. His Tsh was also low which can be sign of changes with his thyroid, however his free t4 is normal which puts his levels still in range. We will need to monitor this

## 2022-05-07 NOTE — Telephone Encounter (Signed)
Left message for patient regarding recent labs. Advised patient to take Calcium OTC per Lauren on 05/07/2022.

## 2022-05-29 DIAGNOSIS — R339 Retention of urine, unspecified: Secondary | ICD-10-CM | POA: Diagnosis not present

## 2022-05-29 DIAGNOSIS — M545 Low back pain, unspecified: Secondary | ICD-10-CM | POA: Diagnosis not present

## 2022-05-29 DIAGNOSIS — M792 Neuralgia and neuritis, unspecified: Secondary | ICD-10-CM | POA: Diagnosis not present

## 2022-05-29 DIAGNOSIS — F1120 Opioid dependence, uncomplicated: Secondary | ICD-10-CM | POA: Diagnosis not present

## 2022-06-17 ENCOUNTER — Ambulatory Visit: Payer: BC Managed Care – PPO | Admitting: Physician Assistant

## 2022-06-25 ENCOUNTER — Other Ambulatory Visit: Payer: Self-pay | Admitting: Physician Assistant

## 2022-06-25 DIAGNOSIS — G4719 Other hypersomnia: Secondary | ICD-10-CM

## 2022-06-25 DIAGNOSIS — R41840 Attention and concentration deficit: Secondary | ICD-10-CM

## 2022-07-10 DIAGNOSIS — Z981 Arthrodesis status: Secondary | ICD-10-CM | POA: Diagnosis not present

## 2022-07-10 DIAGNOSIS — S32012A Unstable burst fracture of first lumbar vertebra, initial encounter for closed fracture: Secondary | ICD-10-CM | POA: Diagnosis not present

## 2022-07-10 DIAGNOSIS — Z6825 Body mass index (BMI) 25.0-25.9, adult: Secondary | ICD-10-CM | POA: Diagnosis not present

## 2022-07-12 DIAGNOSIS — R339 Retention of urine, unspecified: Secondary | ICD-10-CM | POA: Diagnosis not present

## 2022-07-15 ENCOUNTER — Ambulatory Visit: Payer: BC Managed Care – PPO | Admitting: Physician Assistant

## 2022-07-15 ENCOUNTER — Encounter: Payer: Self-pay | Admitting: Physician Assistant

## 2022-07-15 DIAGNOSIS — G4719 Other hypersomnia: Secondary | ICD-10-CM

## 2022-07-15 DIAGNOSIS — M792 Neuralgia and neuritis, unspecified: Secondary | ICD-10-CM

## 2022-07-15 DIAGNOSIS — R41840 Attention and concentration deficit: Secondary | ICD-10-CM

## 2022-07-15 DIAGNOSIS — R7989 Other specified abnormal findings of blood chemistry: Secondary | ICD-10-CM | POA: Diagnosis not present

## 2022-07-15 MED ORDER — AMPHETAMINE-DEXTROAMPHETAMINE 20 MG PO TABS: 20.0000 mg | ORAL_TABLET | Freq: Two times a day (BID) | ORAL | 0 refills | Status: DC

## 2022-07-15 MED ORDER — AMPHETAMINE-DEXTROAMPHETAMINE 20 MG PO TABS: 20.0000 mg | ORAL_TABLET | Freq: Two times a day (BID) | ORAL | 0 refills | Status: DC | PRN

## 2022-07-15 NOTE — Progress Notes (Signed)
[facility] [address] [place], [place] [address]  Internal MEDICINE  Office Visit Note  Patient Name: Kevin Whitehead  [id number]  [id number]  Date of Service: 07/15/2022  Chief Complaint  Patient presents with   Follow-up    HPI Pt is here for routine follow up -Doing well on current medications -no problems with his sleep and adderall helps with his focus and daytime sleepiness -labs reviewed over the phone showed low calcium and he has been supplementing this; his Tsh was also low and discussed this can be a sign of changes with his thyroid and will recheck this and if still abnormal will order thyroid US -Continues to be followed by pain management -he did have visit with neurosurgery recently and was told everything was stable  Current Medication: Outpatient Encounter Medications as of 07/15/2022  Medication Sig Note   acetaminophen (TYLENOL) 325 MG tablet Take 2 tablets (650 mg total) by mouth every 6 (six) hours as needed for mild pain (or Fever >/= 101).    Ascorbic Acid (VITAMIN C) 1000 MG tablet Take 1,000 mg by mouth daily.    bethanechol (URECHOLINE) 25 MG tablet Take 1 tablet (25 mg total) by mouth 3 (three) times daily.    bisacodyl (DULCOLAX) 10 MG suppository Place 1 suppository (10 mg total) rectally daily after supper.    buPROPion ER (WELLBUTRIN SR) 100 MG 12 hr tablet TAKE 1 TABLET BY MOUTH ONCE DAILY    cyanocobalamin 1000 MCG tablet Take 1,000 mcg by mouth daily. 5,000 mcg    diazepam (VALIUM) 5 MG tablet TAKE 1-2 TABLETS (5-10 MG TOTAL) BY MOUTH AT BEDTIME AS NEEDED FOR MUSCLE SPASMS.    docusate sodium (COLACE) 100 MG capsule Take 100 mg by mouth daily.    doxycycline (VIBRA-TABS) 100 MG tablet Take 1 tablet (100 mg total) by mouth 2 (two) times daily.    DULoxetine (CYMBALTA) 60 MG capsule Take 1 capsule (60 mg total) by mouth at bedtime.    fluticasone (FLONASE) 50 MCG/ACT nasal spray Place 1 spray into both nostrils daily as needed for  allergies or rhinitis.    hydrocortisone (ANUSOL-HC) 25 MG suppository PLACE ONE SUPPOSITORY RECTALLY TWO TIMES DAILY    meloxicam (MOBIC) 15 MG tablet Take by mouth.    Multiple Vitamin (MULTIVITAMIN) capsule Take 1 capsule by mouth daily.    nabumetone (RELAFEN) 500 MG tablet Take 1 tablet (500 mg total) by mouth 2 (two) times daily.    oxyCODONE 10 MG TABS Take 1 tablet (10 mg total) by mouth every 3 (three) hours as needed for moderate pain. 12/06/2020: #50 on 11/28/20 #12 today LD 12/06/20   polyethylene glycol (MIRALAX / GLYCOLAX) 17 g packet Take 17 g by mouth daily.    sildenafil (VIAGRA) 100 MG tablet Take 100 mg by mouth as needed.    tamsulosin (FLOMAX) 0.4 MG CAPS capsule TAKE 2 CAPSULES (0.8 MG TOTAL) BY MOUTH DAILY AFTER SUPPER.    [DISCONTINUED] amphetamine-dextroamphetamine (ADDERALL) 20 MG tablet Take 1 tablet (20 mg total) by mouth 2 (two) times daily as needed.    [DISCONTINUED] amphetamine-dextroamphetamine (ADDERALL) 20 MG tablet Take 1 tablet (20 mg total) by mouth 2 (two) times daily.    [DISCONTINUED] amphetamine-dextroamphetamine (ADDERALL) 20 MG tablet Take 1 tablet (20 mg total) by mouth 2 (two) times daily.    [START ON 09/20/2022] amphetamine-dextroamphetamine (ADDERALL) 20 MG tablet Take 1 tablet (20 mg total) by mouth 2 (two) times daily as needed.    [START  ON 08/21/2022] amphetamine-dextroamphetamine (ADDERALL) 20 MG tablet Take 1 tablet (20 mg total) by mouth 2 (two) times daily.    [START ON 07/23/2022] amphetamine-dextroamphetamine (ADDERALL) 20 MG tablet Take 1 tablet (20 mg total) by mouth 2 (two) times daily.    No facility-administered encounter medications on file as of 07/15/2022.    Surgical History: Past Surgical History:  Procedure Laterality Date   ANKLE SURGERY  2016   POSTERIOR LUMBAR FUSION 4 LEVEL N/A 10/30/2020   Procedure: POSTERIOR LUMBAR FUSION 4 LEVEL;  Surgeon: Julio Sicks, MD;  Location: [facility] OR;  Service: Neurosurgery;  Laterality: N/A;  Lumbar  one decompressive laminectomy with transpedicular decompression, Thoracic eleven through Lumbar three posterior lateral arthrodesis utilizing pedicle screw fixation and local autografting    Medical History: Past Medical History:  Diagnosis Date   Attention and concentration deficit    Hemorrhoid     Family History: Family History  Problem Relation Age of Onset   COPD Father     Social History   Socioeconomic History   Marital status: Married    Spouse name: Not on file   Number of children: Not on file   Years of education: Not on file   Highest education level: Not on file  Occupational History   Not on file  Tobacco Use   Smoking status: Former    Types: Cigarettes    Quit date: 2017    Years since quitting: 6.6   Smokeless tobacco: Never  Vaping Use   Vaping Use: Never used  Substance and Sexual Activity   Alcohol use: No   Drug use: No   Sexual activity: Not on file  Other Topics Concern   Not on file  Social History Narrative   Not on file   Social Determinants of Health   Financial Resource Strain: Not on file  Food Insecurity: Not on file  Transportation Needs: Not on file  Physical Activity: Not on file  Stress: Not on file  Social Connections: Not on file  Intimate Partner Violence: Not on file      Review of Systems  Constitutional:  Positive for fatigue. Negative for chills and unexpected weight change.  HENT:  Negative for congestion, postnasal drip, rhinorrhea, sneezing and sore throat.   Eyes:  Negative for redness.  Respiratory:  Negative for cough, chest tightness and shortness of breath.   Cardiovascular:  Negative for chest pain and palpitations.  Gastrointestinal:  Negative for abdominal pain, constipation, diarrhea, nausea and vomiting.  Genitourinary:  Positive for difficulty urinating. Negative for dysuria and frequency.       Self catheterization   Musculoskeletal:  Positive for arthralgias and back pain. Negative for joint  swelling and neck pain.  Skin:  Negative for rash.  Neurological:  Positive for numbness. Negative for tremors.       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 Does not bruise/bleed easily.  Psychiatric/Behavioral:  Positive for decreased concentration. Negative for behavioral problems (Depression), sleep disturbance and suicidal ideas. The patient is not nervous/anxious.     Vital Signs: BP 104/76   Pulse 98   Temp 97.8 F (36.6 C)   Resp 16   Ht 6' (1.829 m)   Wt 168 lb (76.2 kg)   SpO2 96%   BMI 22.78 kg/m    Physical Exam Vitals and nursing note reviewed.  Constitutional:      General: He is not in acute distress.    Appearance:  He is well-developed and normal weight. He is not diaphoretic.  HENT:     Head: Normocephalic and atraumatic.     Right Ear: External ear normal.     Left Ear: External ear normal.     Nose: Nose normal.     Mouth/Throat:     Pharynx: No oropharyngeal exudate.  Eyes:     Conjunctiva/sclera: Conjunctivae normal.     Pupils: Pupils are equal, round, and reactive to light.  Neck:     Thyroid: No thyromegaly.     Vascular: No JVD.     Trachea: No tracheal deviation.  Cardiovascular:     Rate and Rhythm: Normal rate and regular rhythm.     Heart sounds: Normal heart sounds. No murmur heard.    No friction rub. No gallop.  Pulmonary:     Effort: Pulmonary effort is normal. No respiratory distress.     Breath sounds: Normal breath sounds. No stridor. No wheezing or rales.  Chest:     Chest wall: No tenderness.  Abdominal:     General: Bowel sounds are normal.     Palpations: Abdomen is soft.  Musculoskeletal:        General: No tenderness or deformity. Normal range of motion.     Cervical back: Normal range of motion and neck supple.     Comments: Limited ROM from back injury  Lymphadenopathy:     Cervical: No cervical adenopathy.  Skin:    General: Skin is  warm and dry.     Coloration: Skin is not pale.     Findings: No erythema or rash.  Neurological:     Mental Status: He is alert.     Cranial Nerves: No cranial nerve deficit.     Sensory: Sensory deficit present.     Motor: No abnormal muscle tone.     Coordination: Coordination normal.     Deep Tendon Reflexes: Reflexes are normal and symmetric.  Psychiatric:        Behavior: Behavior normal.        Thought Content: Thought content normal.        Judgment: Judgment normal.        Assessment/Plan: 1. Attention and concentration deficit May continue adderall as before - amphetamine-dextroamphetamine (ADDERALL) 20 MG tablet; Take 1 tablet (20 mg total) by mouth 2 (two) times daily as needed.  Dispense: 60 tablet; Refill: 0 - amphetamine-dextroamphetamine (ADDERALL) 20 MG tablet; Take 1 tablet (20 mg total) by mouth 2 (two) times daily.  Dispense: 60 tablet; Refill: 0 - amphetamine-dextroamphetamine (ADDERALL) 20 MG tablet; Take 1 tablet (20 mg total) by mouth 2 (two) times daily.  Dispense: 60 tablet; Refill: 0 [place] Controlled Substance Database was reviewed by me for overdose risk score (ORS) Refilled Controlled medications today. Reviewed risks and possible side effects associated with taking Stimulants. Combination of these drugs with other psychotropic medications could cause dizziness and drowsiness. Pt needs to Monitor symptoms and exercise caution in driving and operating heavy machinery to avoid damages to oneself, to others and to the surroundings. Patient verbalized understanding in this matter. Dependence and abuse for these drugs will be monitored closely. A Controlled substance policy and procedure is on file which allows [facility] to order a urine drug screen test at any visit. Patient understands and agrees with the plan..  2. Excessive daytime sleepiness May continue adderall as before - amphetamine-dextroamphetamine (ADDERALL) 20 MG tablet; Take 1 tablet (20  mg total) by mouth 2 (two) times  daily as needed.  Dispense: 60 tablet; Refill: 0 - amphetamine-dextroamphetamine (ADDERALL) 20 MG tablet; Take 1 tablet (20 mg total) by mouth 2 (two) times daily.  Dispense: 60 tablet; Refill: 0 - amphetamine-dextroamphetamine (ADDERALL) 20 MG tablet; Take 1 tablet (20 mg total) by mouth 2 (two) times daily.  Dispense: 60 tablet; Refill: 0  3. Low serum calcium Has been supplementing and will recheck labs - Calcium  4. Low TSH level Will recheck labs and may need thyroid US - TSH + free T4  5. Neuropathic pain Followed by pain management   General Counseling: lycan davee understanding of the findings of todays visit and agrees with plan of treatment. I have discussed any further diagnostic evaluation that may be needed or ordered today. We also reviewed his medications today. he has been encouraged to call the office with any questions or concerns that should arise related to todays visit.    Orders Placed This Encounter  Procedures   TSH + free T4   Calcium    Meds ordered this encounter  Medications   amphetamine-dextroamphetamine (ADDERALL) 20 MG tablet    Sig: Take 1 tablet (20 mg total) by mouth 2 (two) times daily as needed.    Dispense:  60 tablet    Refill:  0   amphetamine-dextroamphetamine (ADDERALL) 20 MG tablet    Sig: Take 1 tablet (20 mg total) by mouth 2 (two) times daily.    Dispense:  60 tablet    Refill:  0   amphetamine-dextroamphetamine (ADDERALL) 20 MG tablet    Sig: Take 1 tablet (20 mg total) by mouth 2 (two) times daily.    Dispense:  60 tablet    Refill:  0    This patient was seen by Lynn Ito, PA-C in collaboration with Dr. Beverely Risen as a part of collaborative care agreement.   Total time spent:30 Minutes Time spent includes review of chart, medications, test results, and follow up plan with the patient.      Dr Lyndon Code Internal medicine

## 2022-07-25 DIAGNOSIS — R7989 Other specified abnormal findings of blood chemistry: Secondary | ICD-10-CM | POA: Diagnosis not present

## 2022-07-25 DIAGNOSIS — E038 Other specified hypothyroidism: Secondary | ICD-10-CM | POA: Diagnosis not present

## 2022-07-26 ENCOUNTER — Telehealth: Payer: Self-pay

## 2022-07-26 LAB — TSH+FREE T4
Free T4: 1.2 ng/dL (ref 0.82–1.77)
TSH: 0.733 u[IU]/mL (ref 0.450–4.500)

## 2022-07-26 LAB — CALCIUM: Calcium: 9.2 mg/dL (ref 8.7–10.2)

## 2022-07-26 NOTE — Telephone Encounter (Signed)
-----   Message from Carlean Jews, PA-C sent at 07/26/2022 12:57 PM EDT ----- Please let him know that his labs are now back in normal range

## 2022-07-26 NOTE — Telephone Encounter (Signed)
Left message to notify patient that labs resulted normal.

## 2022-08-10 DIAGNOSIS — R339 Retention of urine, unspecified: Secondary | ICD-10-CM | POA: Diagnosis not present

## 2022-08-19 DIAGNOSIS — Z6825 Body mass index (BMI) 25.0-25.9, adult: Secondary | ICD-10-CM | POA: Diagnosis not present

## 2022-08-19 DIAGNOSIS — F1120 Opioid dependence, uncomplicated: Secondary | ICD-10-CM | POA: Diagnosis not present

## 2022-08-19 DIAGNOSIS — S32012A Unstable burst fracture of first lumbar vertebra, initial encounter for closed fracture: Secondary | ICD-10-CM | POA: Diagnosis not present

## 2022-08-21 ENCOUNTER — Telehealth: Payer: Self-pay

## 2022-08-21 ENCOUNTER — Other Ambulatory Visit: Payer: Self-pay | Admitting: Physician Assistant

## 2022-08-21 MED ORDER — AMPHETAMINE-DEXTROAMPHETAMINE 15 MG PO TABS: 15.0000 mg | ORAL_TABLET | Freq: Two times a day (BID) | ORAL | 0 refills | Status: DC

## 2022-08-21 MED ORDER — AMPHETAMINE-DEXTROAMPHETAMINE 5 MG PO TABS: 5.0000 mg | ORAL_TABLET | Freq: Two times a day (BID) | ORAL | 0 refills | Status: DC

## 2022-08-21 NOTE — Telephone Encounter (Signed)
Spoke with patient regarding new Adderall dosage since pharmacy does not carry 20mg , per Ander Purpura and Desha.

## 2022-09-16 DIAGNOSIS — R339 Retention of urine, unspecified: Secondary | ICD-10-CM | POA: Diagnosis not present

## 2022-10-17 ENCOUNTER — Ambulatory Visit: Payer: BC Managed Care – PPO | Admitting: Physician Assistant

## 2022-10-17 ENCOUNTER — Encounter: Payer: Self-pay | Admitting: Physician Assistant

## 2022-10-17 VITALS — BP 120/79 | HR 95 | Temp 97.8°F | Resp 16 | Ht 72.0 in | Wt 191.0 lb

## 2022-10-17 DIAGNOSIS — R41840 Attention and concentration deficit: Secondary | ICD-10-CM

## 2022-10-17 DIAGNOSIS — G4719 Other hypersomnia: Secondary | ICD-10-CM

## 2022-10-17 DIAGNOSIS — M792 Neuralgia and neuritis, unspecified: Secondary | ICD-10-CM

## 2022-10-17 MED ORDER — AMPHETAMINE-DEXTROAMPHETAMINE 20 MG PO TABS: 20.0000 mg | ORAL_TABLET | Freq: Two times a day (BID) | ORAL | 0 refills | Status: DC | PRN

## 2022-10-17 MED ORDER — AMPHETAMINE-DEXTROAMPHETAMINE 20 MG PO TABS: 20.0000 mg | ORAL_TABLET | Freq: Two times a day (BID) | ORAL | 0 refills | Status: DC

## 2022-10-17 NOTE — Progress Notes (Signed)
[facility] [address] [place], [place] [id number]  Internal MEDICINE  Office Visit Note  Patient Name: Kevin ReiningHarold K Burtch Jr.  [id number]  [id number]  Date of Service: 10/17/2022  Chief Complaint  Patient presents with   Follow-up    HPI Pt is here for routine follow up -He is doing well and has no complaints today -No changes to his medications since last visit -Continues to do well with adderall helping alertness, due for refills and has no side effects -Sleeping well -Continues to follow with pain management, Xtampza ER 13.5 BID and oxycodone 10mg  TID  Current Medication: Outpatient Encounter Medications as of 10/17/2022  Medication Sig Note   acetaminophen (TYLENOL) 325 MG tablet Take 2 tablets (650 mg total) by mouth every 6 (six) hours as needed for mild pain (or Fever >/= 101).    Ascorbic Acid (VITAMIN C) 1000 MG tablet Take 1,000 mg by mouth daily.    bethanechol (URECHOLINE) 25 MG tablet Take 1 tablet (25 mg total) by mouth 3 (three) times daily.    bisacodyl (DULCOLAX) 10 MG suppository Place 1 suppository (10 mg total) rectally daily after supper.    buPROPion ER (WELLBUTRIN SR) 100 MG 12 hr tablet TAKE 1 TABLET BY MOUTH ONCE DAILY    cyanocobalamin 1000 MCG tablet Take 1,000 mcg by mouth daily. 5,000 mcg    diazepam (VALIUM) 5 MG tablet TAKE 1-2 TABLETS (5-10 MG TOTAL) BY MOUTH AT BEDTIME AS NEEDED FOR MUSCLE SPASMS.    docusate sodium (COLACE) 100 MG capsule Take 100 mg by mouth daily.    doxycycline (VIBRA-TABS) 100 MG tablet Take 1 tablet (100 mg total) by mouth 2 (two) times daily.    DULoxetine (CYMBALTA) 60 MG capsule Take 1 capsule (60 mg total) by mouth at bedtime.    fluticasone (FLONASE) 50 MCG/ACT nasal spray Place 1 spray into both nostrils daily as needed for allergies or rhinitis.    hydrocortisone (ANUSOL-HC) 25 MG suppository PLACE ONE SUPPOSITORY RECTALLY TWO TIMES DAILY    meloxicam (MOBIC) 15 MG tablet Take by mouth.    Multiple  Vitamin (MULTIVITAMIN) capsule Take 1 capsule by mouth daily.    nabumetone (RELAFEN) 500 MG tablet Take 1 tablet (500 mg total) by mouth 2 (two) times daily.    oxyCODONE 10 MG TABS Take 1 tablet (10 mg total) by mouth every 3 (three) hours as needed for moderate pain. 12/06/2020: #50 on 11/28/20 #12 today LD 12/06/20   oxyCODONE ER (XTAMPZA ER) 13.5 MG C12A Take by mouth 2 (two) times daily.    polyethylene glycol (MIRALAX / GLYCOLAX) 17 g packet Take 17 g by mouth daily.    sildenafil (VIAGRA) 100 MG tablet Take 100 mg by mouth as needed.    tamsulosin (FLOMAX) 0.4 MG CAPS capsule TAKE 2 CAPSULES (0.8 MG TOTAL) BY MOUTH DAILY AFTER SUPPER.    [DISCONTINUED] amphetamine-dextroamphetamine (ADDERALL) 15 MG tablet Take 1 tablet by mouth 2 (two) times daily.    [DISCONTINUED] amphetamine-dextroamphetamine (ADDERALL) 20 MG tablet Take 1 tablet (20 mg total) by mouth 2 (two) times daily as needed.    [DISCONTINUED] amphetamine-dextroamphetamine (ADDERALL) 20 MG tablet Take 1 tablet (20 mg total) by mouth 2 (two) times daily.    [DISCONTINUED] amphetamine-dextroamphetamine (ADDERALL) 20 MG tablet Take 1 tablet (20 mg total) by mouth 2 (two) times daily.    [DISCONTINUED] amphetamine-dextroamphetamine (ADDERALL) 5 MG tablet Take 1 tablet (5 mg total) by mouth 2 (two) times daily.    [START ON 10/18/2022]  amphetamine-dextroamphetamine (ADDERALL) 20 MG tablet Take 1 tablet (20 mg total) by mouth 2 (two) times daily.    [START ON 11/15/2022] amphetamine-dextroamphetamine (ADDERALL) 20 MG tablet Take 1 tablet (20 mg total) by mouth 2 (two) times daily.    [START ON 12/15/2022] amphetamine-dextroamphetamine (ADDERALL) 20 MG tablet Take 1 tablet (20 mg total) by mouth 2 (two) times daily as needed.    No facility-administered encounter medications on file as of 10/17/2022.    Surgical History: Past Surgical History:  Procedure Laterality Date   ANKLE SURGERY  2016   POSTERIOR LUMBAR FUSION 4 LEVEL N/A  10/30/2020   Procedure: POSTERIOR LUMBAR FUSION 4 LEVEL;  Surgeon: Julio Sicks, MD;  Location: [facility] OR;  Service: Neurosurgery;  Laterality: N/A;  Lumbar one decompressive laminectomy with transpedicular decompression, Thoracic eleven through Lumbar three posterior lateral arthrodesis utilizing pedicle screw fixation and local autografting    Medical History: Past Medical History:  Diagnosis Date   Attention and concentration deficit    Hemorrhoid     Family History: Family History  Problem Relation Age of Onset   COPD Father     Social History   Socioeconomic History   Marital status: Married    Spouse name: Not on file   Number of children: Not on file   Years of education: Not on file   Highest education level: Not on file  Occupational History   Not on file  Tobacco Use   Smoking status: Former    Types: Cigarettes    Quit date: 2017    Years since quitting: 6.9   Smokeless tobacco: Never  Vaping Use   Vaping Use: Never used  Substance and Sexual Activity   Alcohol use: No   Drug use: No   Sexual activity: Not on file  Other Topics Concern   Not on file  Social History Narrative   Not on file   Social Determinants of Health   Financial Resource Strain: Not on file  Food Insecurity: Not on file  Transportation Needs: Not on file  Physical Activity: Not on file  Stress: Not on file  Social Connections: Not on file  Intimate Partner Violence: Not on file      Review of Systems  Constitutional:  Positive for fatigue. Negative for chills and unexpected weight change.  HENT:  Negative for congestion, postnasal drip, rhinorrhea, sneezing and sore throat.   Eyes:  Negative for redness.  Respiratory:  Negative for cough, chest tightness and shortness of breath.   Cardiovascular:  Negative for chest pain and palpitations.  Gastrointestinal:  Negative for abdominal pain, constipation, diarrhea, nausea and vomiting.  Genitourinary:  Positive for difficulty  urinating. Negative for dysuria and frequency.       Self catheterization   Musculoskeletal:  Positive for arthralgias and back pain. Negative for joint swelling and neck pain.  Skin:  Negative for rash.  Neurological:  Positive for numbness. Negative for tremors.       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 Does not bruise/bleed easily.  Psychiatric/Behavioral:  Positive for decreased concentration. Negative for behavioral problems (Depression), sleep disturbance and suicidal ideas. The patient is not nervous/anxious.     Vital Signs: BP 120/79   Pulse 95   Temp 97.8 F (36.6 C)   Resp 16   Ht 6' (1.829 m)   Wt 191 lb (86.6 kg)   SpO2 98%   BMI 25.90 kg/m    Physical Exam  Vitals and nursing note reviewed.  Constitutional:      General: He is not in acute distress.    Appearance: He is well-developed and normal weight. He is not diaphoretic.  HENT:     Head: Normocephalic and atraumatic.     Right Ear: External ear normal.     Left Ear: External ear normal.     Nose: Nose normal.     Mouth/Throat:     Pharynx: No oropharyngeal exudate.  Eyes:     Conjunctiva/sclera: Conjunctivae normal.     Pupils: Pupils are equal, round, and reactive to light.  Neck:     Thyroid: No thyromegaly.     Vascular: No JVD.     Trachea: No tracheal deviation.  Cardiovascular:     Rate and Rhythm: Normal rate and regular rhythm.     Heart sounds: Normal heart sounds. No murmur heard.    No friction rub. No gallop.  Pulmonary:     Effort: Pulmonary effort is normal. No respiratory distress.     Breath sounds: Normal breath sounds. No stridor. No wheezing or rales.  Chest:     Chest wall: No tenderness.  Abdominal:     General: Bowel sounds are normal.     Palpations: Abdomen is soft.  Musculoskeletal:        General: No tenderness or deformity. Normal range of motion.     Cervical back: Normal range of  motion and neck supple.     Comments: Limited ROM from back injury  Lymphadenopathy:     Cervical: No cervical adenopathy.  Skin:    General: Skin is warm and dry.     Coloration: Skin is not pale.     Findings: No erythema or rash.  Neurological:     Mental Status: He is alert.     Cranial Nerves: No cranial nerve deficit.     Sensory: Sensory deficit present.     Motor: No abnormal muscle tone.     Coordination: Coordination normal.     Deep Tendon Reflexes: Reflexes are normal and symmetric.  Psychiatric:        Behavior: Behavior normal.        Thought Content: Thought content normal.        Judgment: Judgment normal.        Assessment/Plan: 1. Attention and concentration deficit May continue adderall BID as before - amphetamine-dextroamphetamine (ADDERALL) 20 MG tablet; Take 1 tablet (20 mg total) by mouth 2 (two) times daily.  Dispense: 60 tablet; Refill: 0 - amphetamine-dextroamphetamine (ADDERALL) 20 MG tablet; Take 1 tablet (20 mg total) by mouth 2 (two) times daily.  Dispense: 60 tablet; Refill: 0 - amphetamine-dextroamphetamine (ADDERALL) 20 MG tablet; Take 1 tablet (20 mg total) by mouth 2 (two) times daily as needed.  Dispense: 60 tablet; Refill: 0 [place] Controlled Substance Database was reviewed by me for overdose risk score (ORS) Refilled Controlled medications today. Reviewed risks and possible side effects associated with taking Stimulants. Combination of these drugs with other psychotropic medications could cause dizziness and drowsiness. Pt needs to Monitor symptoms and exercise caution in driving and operating heavy machinery to avoid damages to oneself, to others and to the surroundings. Patient verbalized understanding in this matter. Dependence and abuse for these drugs will be monitored closely. A Controlled substance policy and procedure is on file which allows [facility] to order a urine drug screen test at any visit. Patient understands and agrees  with the plan..  2. Excessive  daytime sleepiness May continue adderall BID as before - amphetamine-dextroamphetamine (ADDERALL) 20 MG tablet; Take 1 tablet (20 mg total) by mouth 2 (two) times daily.  Dispense: 60 tablet; Refill: 0 - amphetamine-dextroamphetamine (ADDERALL) 20 MG tablet; Take 1 tablet (20 mg total) by mouth 2 (two) times daily.  Dispense: 60 tablet; Refill: 0 - amphetamine-dextroamphetamine (ADDERALL) 20 MG tablet; Take 1 tablet (20 mg total) by mouth 2 (two) times daily as needed.  Dispense: 60 tablet; Refill: 0  3. Neuropathic pain Followed by pain management   General Counseling: jayzen paver understanding of the findings of todays visit and agrees with plan of treatment. I have discussed any further diagnostic evaluation that may be needed or ordered today. We also reviewed his medications today. he has been encouraged to call the office with any questions or concerns that should arise related to todays visit.    No orders of the defined types were placed in this encounter.   Meds ordered this encounter  Medications   amphetamine-dextroamphetamine (ADDERALL) 20 MG tablet    Sig: Take 1 tablet (20 mg total) by mouth 2 (two) times daily.    Dispense:  60 tablet    Refill:  0   amphetamine-dextroamphetamine (ADDERALL) 20 MG tablet    Sig: Take 1 tablet (20 mg total) by mouth 2 (two) times daily.    Dispense:  60 tablet    Refill:  0   amphetamine-dextroamphetamine (ADDERALL) 20 MG tablet    Sig: Take 1 tablet (20 mg total) by mouth 2 (two) times daily as needed.    Dispense:  60 tablet    Refill:  0    This patient was seen by Lynn Ito, PA-C in collaboration with Dr. Beverely Risen as a part of collaborative care agreement.   Total time spent:30 Minutes Time spent includes review of chart, medications, test results, and follow up plan with the patient.      Dr Lyndon Code Internal medicine

## 2022-11-05 DIAGNOSIS — S32012S Unstable burst fracture of first lumbar vertebra, sequela: Secondary | ICD-10-CM | POA: Diagnosis not present

## 2022-11-05 DIAGNOSIS — M792 Neuralgia and neuritis, unspecified: Secondary | ICD-10-CM | POA: Diagnosis not present

## 2022-11-05 DIAGNOSIS — N312 Flaccid neuropathic bladder, not elsewhere classified: Secondary | ICD-10-CM | POA: Diagnosis not present

## 2022-11-05 DIAGNOSIS — R339 Retention of urine, unspecified: Secondary | ICD-10-CM | POA: Diagnosis not present

## 2022-11-05 DIAGNOSIS — F1120 Opioid dependence, uncomplicated: Secondary | ICD-10-CM | POA: Diagnosis not present

## 2022-11-13 ENCOUNTER — Other Ambulatory Visit: Payer: Self-pay | Admitting: Neurosurgery

## 2022-11-13 DIAGNOSIS — S32012S Unstable burst fracture of first lumbar vertebra, sequela: Secondary | ICD-10-CM

## 2022-12-10 ENCOUNTER — Ambulatory Visit
Admission: RE | Admit: 2022-12-10 | Discharge: 2022-12-10 | Disposition: A | Discharge status: Home / Self Care | Payer: No Typology Code available for payment source | Source: Ambulatory Visit | Attending: Neurosurgery | Admitting: Neurosurgery

## 2022-12-10 DIAGNOSIS — S32012S Unstable burst fracture of first lumbar vertebra, sequela: Secondary | ICD-10-CM

## 2022-12-10 MED ORDER — GADOBENATE DIMEGLUMINE 529 MG/ML IV SOLN
15.0000 mL | Freq: Once | INTRAVENOUS | Status: AC | PRN
  Administered 2022-12-10: 15 mL via INTRAVENOUS

## 2022-12-27 ENCOUNTER — Other Ambulatory Visit: Payer: Self-pay | Admitting: Physician Assistant

## 2022-12-27 DIAGNOSIS — G4719 Other hypersomnia: Secondary | ICD-10-CM

## 2022-12-27 DIAGNOSIS — R41840 Attention and concentration deficit: Secondary | ICD-10-CM

## 2023-01-13 ENCOUNTER — Ambulatory Visit: Payer: BC Managed Care – PPO | Admitting: Physician Assistant

## 2023-01-13 ENCOUNTER — Telehealth: Payer: Self-pay

## 2023-01-13 ENCOUNTER — Encounter: Payer: Self-pay | Admitting: Physician Assistant

## 2023-01-13 VITALS — BP 115/73 | HR 95 | Temp 97.6°F | Resp 16 | Ht 72.0 in | Wt 191.0 lb

## 2023-01-13 DIAGNOSIS — E538 Deficiency of other specified B group vitamins: Secondary | ICD-10-CM

## 2023-01-13 DIAGNOSIS — M792 Neuralgia and neuritis, unspecified: Secondary | ICD-10-CM

## 2023-01-13 DIAGNOSIS — R5383 Other fatigue: Secondary | ICD-10-CM

## 2023-01-13 DIAGNOSIS — E782 Mixed hyperlipidemia: Secondary | ICD-10-CM

## 2023-01-13 DIAGNOSIS — E559 Vitamin D deficiency, unspecified: Secondary | ICD-10-CM

## 2023-01-13 DIAGNOSIS — G4719 Other hypersomnia: Secondary | ICD-10-CM | POA: Diagnosis not present

## 2023-01-13 DIAGNOSIS — R7989 Other specified abnormal findings of blood chemistry: Secondary | ICD-10-CM | POA: Diagnosis not present

## 2023-01-13 DIAGNOSIS — R41840 Attention and concentration deficit: Secondary | ICD-10-CM | POA: Diagnosis not present

## 2023-01-13 DIAGNOSIS — Z79899 Other long term (current) drug therapy: Secondary | ICD-10-CM

## 2023-01-13 LAB — POCT URINE DRUG SCREEN
Methylenedioxyamphetamine: NOT DETECTED
POC Amphetamine UR: POSITIVE — AB
POC BENZODIAZEPINES UR: NOT DETECTED
POC Barbiturate UR: NOT DETECTED
POC Cocaine UR: NOT DETECTED
POC Ecstasy UR: NOT DETECTED
POC Marijuana UR: NOT DETECTED
POC Methadone UR: NOT DETECTED
POC Methamphetamine UR: NOT DETECTED
POC Opiate Ur: NOT DETECTED
POC Oxycodone UR: POSITIVE — AB
POC PHENCYCLIDINE UR: NOT DETECTED
POC TRICYCLICS UR: NOT DETECTED

## 2023-01-13 MED ORDER — AMPHETAMINE-DEXTROAMPHETAMINE 20 MG PO TABS: 20.0000 mg | ORAL_TABLET | Freq: Two times a day (BID) | ORAL | 0 refills | Status: DC | PRN

## 2023-01-13 MED ORDER — AMPHETAMINE-DEXTROAMPHETAMINE 20 MG PO TABS: 20.0000 mg | ORAL_TABLET | Freq: Two times a day (BID) | ORAL | 0 refills | Status: DC

## 2023-01-13 NOTE — Progress Notes (Signed)
[facility] [place], [place] [address]  Internal MEDICINE  Office Visit Note  Patient Name: Kevin Whitehead  [id number]  [id number]  Date of Service: 01/13/2023  Chief Complaint  Patient presents with   Follow-up   Medication Refill    HPI Pt is here for routine follow up -Doing well with adderall dosing, no sleep impact or palpitations -did decrease wellbutrin to once per day, he is trying to taper off. So far has not noticed any difference and would like to reduce medications if possible -No complaints today -CPE next visit and will order labs  Current Medication: Outpatient Encounter Medications as of 01/13/2023  Medication Sig Note   acetaminophen (TYLENOL) 325 MG tablet Take 2 tablets (650 mg total) by mouth every 6 (six) hours as needed for mild pain (or Fever >/= 101).    Ascorbic Acid (VITAMIN C) 1000 MG tablet Take 1,000 mg by mouth daily.    bisacodyl (DULCOLAX) 10 MG suppository Place 1 suppository (10 mg total) rectally daily after supper.    buPROPion ER (WELLBUTRIN SR) 100 MG 12 hr tablet TAKE 1 TABLET BY MOUTH ONCE DAILY    cyanocobalamin 1000 MCG tablet Take 1,000 mcg by mouth daily. 5,000 mcg    diazepam (VALIUM) 5 MG tablet TAKE 1-2 TABLETS (5-10 MG TOTAL) BY MOUTH AT BEDTIME AS NEEDED FOR MUSCLE SPASMS.    docusate sodium (COLACE) 100 MG capsule Take 100 mg by mouth daily.    fluticasone (FLONASE) 50 MCG/ACT nasal spray Place 1 spray into both nostrils daily as needed for allergies or rhinitis.    hydrocortisone (ANUSOL-HC) 25 MG suppository PLACE ONE SUPPOSITORY RECTALLY TWO TIMES DAILY    meloxicam (MOBIC) 15 MG tablet Take by mouth.    Multiple Vitamin (MULTIVITAMIN) capsule Take 1 capsule by mouth daily.    oxyCODONE 10 MG TABS Take 1 tablet (10 mg total) by mouth every 3 (three) hours as needed for moderate pain. 12/06/2020: #50 on 11/28/20 #12 today LD 12/06/20   oxyCODONE ER (XTAMPZA ER) 13.5 MG C12A Take by mouth 2 (two) times  daily.    polyethylene glycol (MIRALAX / GLYCOLAX) 17 g packet Take 17 g by mouth daily.    sildenafil (VIAGRA) 100 MG tablet Take 100 mg by mouth as needed.    [DISCONTINUED] amphetamine-dextroamphetamine (ADDERALL) 20 MG tablet Take 1 tablet (20 mg total) by mouth 2 (two) times daily.    [DISCONTINUED] amphetamine-dextroamphetamine (ADDERALL) 20 MG tablet Take 1 tablet (20 mg total) by mouth 2 (two) times daily.    [DISCONTINUED] amphetamine-dextroamphetamine (ADDERALL) 20 MG tablet Take 1 tablet (20 mg total) by mouth 2 (two) times daily as needed.    [DISCONTINUED] bethanechol (URECHOLINE) 25 MG tablet Take 1 tablet (25 mg total) by mouth 3 (three) times daily.    [DISCONTINUED] doxycycline (VIBRA-TABS) 100 MG tablet Take 1 tablet (100 mg total) by mouth 2 (two) times daily.    [DISCONTINUED] DULoxetine (CYMBALTA) 60 MG capsule Take 1 capsule (60 mg total) by mouth at bedtime.    [DISCONTINUED] nabumetone (RELAFEN) 500 MG tablet Take 1 tablet (500 mg total) by mouth 2 (two) times daily.    [DISCONTINUED] tamsulosin (FLOMAX) 0.4 MG CAPS capsule TAKE 2 CAPSULES (0.8 MG TOTAL) BY MOUTH DAILY AFTER SUPPER.    [START ON 01/15/2023] amphetamine-dextroamphetamine (ADDERALL) 20 MG tablet Take 1 tablet (20 mg total) by mouth 2 (two) times daily.    [START ON 02/13/2023] amphetamine-dextroamphetamine (ADDERALL) 20 MG tablet Take 1 tablet (  20 mg total) by mouth 2 (two) times daily as needed.    [START ON 03/14/2023] amphetamine-dextroamphetamine (ADDERALL) 20 MG tablet Take 1 tablet (20 mg total) by mouth 2 (two) times daily.    No facility-administered encounter medications on file as of 01/13/2023.    Surgical History: Past Surgical History:  Procedure Laterality Date   ANKLE SURGERY  2016   POSTERIOR LUMBAR FUSION 4 LEVEL N/A 10/30/2020   Procedure: POSTERIOR LUMBAR FUSION 4 LEVEL;  Surgeon: Earnie Larsson, MD;  Location: [facility];  Service: Neurosurgery;  Laterality: N/A;  Lumbar one decompressive  laminectomy with transpedicular decompression, Thoracic eleven through Lumbar three posterior lateral arthrodesis utilizing pedicle screw fixation and local autografting    Medical History: Past Medical History:  Diagnosis Date   Attention and concentration deficit    Hemorrhoid     Family History: Family History  Problem Relation Age of Onset   COPD Father     Social History   Socioeconomic History   Marital status: Married    Spouse name: Not on file   Number of children: Not on file   Years of education: Not on file   Highest education level: Not on file  Occupational History   Not on file  Tobacco Use   Smoking status: Former    Types: Cigarettes    Quit date: 2017    Years since quitting: 7.1   Smokeless tobacco: Never  Vaping Use   Vaping Use: Never used  Substance and Sexual Activity   Alcohol use: No   Drug use: No   Sexual activity: Not on file  Other Topics Concern   Not on file  Social History Narrative   Not on file   Social Determinants of Health   Financial Resource Strain: Not on file  Food Insecurity: Not on file  Transportation Needs: Not on file  Physical Activity: Not on file  Stress: Not on file  Social Connections: Not on file  Intimate Partner Violence: Not on file      Review of Systems  Constitutional:  Positive for fatigue. Negative for chills and unexpected weight change.  HENT:  Negative for congestion, postnasal drip, rhinorrhea, sneezing and sore throat.   Eyes:  Negative for redness.  Respiratory:  Negative for cough, chest tightness and shortness of breath.   Cardiovascular:  Negative for chest pain and palpitations.  Gastrointestinal:  Negative for abdominal pain, constipation, diarrhea, nausea and vomiting.  Genitourinary:  Positive for difficulty urinating. Negative for dysuria and frequency.       Self catheterization   Musculoskeletal:  Positive for arthralgias and back pain. Negative for joint swelling and neck  pain.  Skin:  Negative for rash.  Neurological:  Positive for numbness. Negative for tremors.       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 Does not bruise/bleed easily.  Psychiatric/Behavioral:  Positive for decreased concentration. Negative for behavioral problems (Depression), sleep disturbance and suicidal ideas. The patient is not nervous/anxious.     Vital Signs: BP 115/73   Pulse 95   Temp 97.6 F (36.4 C)   Resp 16   Ht 6' (1.829 m)   Wt 191 lb (86.6 kg)   SpO2 96%   BMI 25.90 kg/m    Physical Exam Vitals and nursing note reviewed.  Constitutional:      General: He is not in acute distress.    Appearance: He is well-developed and normal weight. He is  not diaphoretic.  HENT:     Head: Normocephalic and atraumatic.     Right Ear: External ear normal.     Left Ear: External ear normal.     Nose: Nose normal.     Mouth/Throat:     Pharynx: No oropharyngeal exudate.  Eyes:     Conjunctiva/sclera: Conjunctivae normal.     Pupils: Pupils are equal, round, and reactive to light.  Neck:     Thyroid: No thyromegaly.     Vascular: No JVD.     Trachea: No tracheal deviation.  Cardiovascular:     Rate and Rhythm: Normal rate and regular rhythm.     Heart sounds: Normal heart sounds. No murmur heard.    No friction rub. No gallop.  Pulmonary:     Effort: Pulmonary effort is normal. No respiratory distress.     Breath sounds: Normal breath sounds. No stridor. No wheezing or rales.  Chest:     Chest wall: No tenderness.  Abdominal:     General: Bowel sounds are normal.     Palpations: Abdomen is soft.  Musculoskeletal:        General: No tenderness or deformity. Normal range of motion.     Cervical back: Normal range of motion and neck supple.     Comments: Limited ROM from back injury  Lymphadenopathy:     Cervical: No cervical adenopathy.  Skin:    General: Skin is warm and dry.      Coloration: Skin is not pale.     Findings: No erythema or rash.  Neurological:     Mental Status: He is alert.     Cranial Nerves: No cranial nerve deficit.     Sensory: Sensory deficit present.     Motor: No abnormal muscle tone.     Coordination: Coordination normal.     Deep Tendon Reflexes: Reflexes are normal and symmetric.  Psychiatric:        Behavior: Behavior normal.        Thought Content: Thought content normal.        Judgment: Judgment normal.        Assessment/Plan: 1. Attention and concentration deficit May continue adderall BID as before, hand script given for 1 month due to issue with escript. Will call prior to refills needed and will attempt to resend - amphetamine-dextroamphetamine (ADDERALL) 20 MG tablet; Take 1 tablet (20 mg total) by mouth 2 (two) times daily.  Dispense: 60 tablet; Refill: 0 - amphetamine-dextroamphetamine (ADDERALL) 20 MG tablet; Take 1 tablet (20 mg total) by mouth 2 (two) times daily as needed.  Dispense: 60 tablet; Refill: 0 - amphetamine-dextroamphetamine (ADDERALL) 20 MG tablet; Take 1 tablet (20 mg total) by mouth 2 (two) times daily.  Dispense: 60 tablet; Refill: 0 [place] Controlled Substance Database was reviewed by me for overdose risk score (ORS) Refilled Controlled medications today. Reviewed risks and possible side effects associated with taking Stimulants. Combination of these drugs with other psychotropic medications could cause dizziness and drowsiness. Pt needs to Monitor symptoms and exercise caution in driving and operating heavy machinery to avoid damages to oneself, to others and to the surroundings. Patient verbalized understanding in this matter. Dependence and abuse for these drugs will be monitored closely. A Controlled substance policy and procedure is on file which allows [facility] to order a urine drug screen test at any visit. Patient understands and agrees with the plan..  2. Excessive daytime sleepiness May  continue adderall BID as before -  amphetamine-dextroamphetamine (ADDERALL) 20 MG tablet; Take 1 tablet (20 mg total) by mouth 2 (two) times daily.  Dispense: 60 tablet; Refill: 0 - amphetamine-dextroamphetamine (ADDERALL) 20 MG tablet; Take 1 tablet (20 mg total) by mouth 2 (two) times daily as needed.  Dispense: 60 tablet; Refill: 0 - amphetamine-dextroamphetamine (ADDERALL) 20 MG tablet; Take 1 tablet (20 mg total) by mouth 2 (two) times daily.  Dispense: 60 tablet; Refill: 0  3. Neuropathic pain Followed by pain management  4. Abnormal thyroid blood test - TSH + free T4  5. Mixed hyperlipidemia - Lipid Panel With LDL/HDL Ratio  6. Vitamin D deficiency - VITAMIN D 25 Hydroxy (Vit-D Deficiency, Fractures)  7. B12 deficiency - B12 and Folate Panel  8. Other fatigue - CBC w/Diff/Platelet - Comprehensive metabolic panel - TSH + free T4 - Lipid Panel With LDL/HDL Ratio - VITAMIN D 25 Hydroxy (Vit-D Deficiency, Fractures) - B12 and Folate Panel  9. Encounter for long-term (current) use of high-risk medication - POCT Urine Drug Screen   General Counseling: Mieczyslaw verbalizes understanding of the findings of todays visit and agrees with plan of treatment. I have discussed any further diagnostic evaluation that may be needed or ordered today. We also reviewed his medications today. he has been encouraged to call the office with any questions or concerns that should arise related to todays visit.    Orders Placed This Encounter  Procedures   CBC w/Diff/Platelet   Comprehensive metabolic panel   TSH + free T4   Lipid Panel With LDL/HDL Ratio   VITAMIN D 25 Hydroxy (Vit-D Deficiency, Fractures)   B12 and Folate Panel   POCT Urine Drug Screen    Meds ordered this encounter  Medications   amphetamine-dextroamphetamine (ADDERALL) 20 MG tablet    Sig: Take 1 tablet (20 mg total) by mouth 2 (two) times daily.    Dispense:  60 tablet    Refill:  0   amphetamine-dextroamphetamine  (ADDERALL) 20 MG tablet    Sig: Take 1 tablet (20 mg total) by mouth 2 (two) times daily as needed.    Dispense:  60 tablet    Refill:  0   amphetamine-dextroamphetamine (ADDERALL) 20 MG tablet    Sig: Take 1 tablet (20 mg total) by mouth 2 (two) times daily.    Dispense:  60 tablet    Refill:  0    This patient was seen by Drema Dallas, PA-C in collaboration with Dr. Clayborn Bigness as a part of collaborative care agreement.   Total time spent:30 Minutes Time spent includes review of chart, medications, test results, and follow up plan with the patient.      Dr Lavera Guise Internal medicine

## 2023-01-13 NOTE — Telephone Encounter (Signed)
Pt advised due to esend pres is failed to send to [facility] he need to pickup pres for adderall and also next 2 refills called next month and we can resend pres

## 2023-02-03 DIAGNOSIS — R339 Retention of urine, unspecified: Secondary | ICD-10-CM | POA: Diagnosis not present

## 2023-02-06 DIAGNOSIS — M5416 Radiculopathy, lumbar region: Secondary | ICD-10-CM | POA: Diagnosis not present

## 2023-02-06 DIAGNOSIS — F1120 Opioid dependence, uncomplicated: Secondary | ICD-10-CM | POA: Diagnosis not present

## 2023-02-06 DIAGNOSIS — Z6825 Body mass index (BMI) 25.0-25.9, adult: Secondary | ICD-10-CM | POA: Diagnosis not present

## 2023-02-06 DIAGNOSIS — S32012A Unstable burst fracture of first lumbar vertebra, initial encounter for closed fracture: Secondary | ICD-10-CM | POA: Diagnosis not present

## 2023-02-13 ENCOUNTER — Other Ambulatory Visit: Payer: Self-pay | Admitting: Physician Assistant

## 2023-03-04 DIAGNOSIS — R339 Retention of urine, unspecified: Secondary | ICD-10-CM | POA: Diagnosis not present

## 2023-03-11 DIAGNOSIS — Z6825 Body mass index (BMI) 25.0-25.9, adult: Secondary | ICD-10-CM | POA: Diagnosis not present

## 2023-03-11 DIAGNOSIS — S32012A Unstable burst fracture of first lumbar vertebra, initial encounter for closed fracture: Secondary | ICD-10-CM | POA: Diagnosis not present

## 2023-03-14 ENCOUNTER — Telehealth: Payer: Self-pay

## 2023-03-14 ENCOUNTER — Other Ambulatory Visit: Payer: Self-pay | Admitting: Physician Assistant

## 2023-03-14 MED ORDER — AMPHETAMINE-DEXTROAMPHETAMINE 20 MG PO TABS: 20.0000 mg | ORAL_TABLET | Freq: Two times a day (BID) | ORAL | 0 refills | Status: DC

## 2023-03-14 NOTE — Telephone Encounter (Signed)
Pt wife aware that we send pres

## 2023-04-01 DIAGNOSIS — R339 Retention of urine, unspecified: Secondary | ICD-10-CM | POA: Diagnosis not present

## 2023-04-14 ENCOUNTER — Emergency Department (HOSPITAL_COMMUNITY): Payer: BC Managed Care – PPO

## 2023-04-14 ENCOUNTER — Other Ambulatory Visit: Payer: Self-pay

## 2023-04-14 ENCOUNTER — Emergency Department (HOSPITAL_COMMUNITY)
Admission: EM | Admit: 2023-04-14 | Discharge: 2023-04-14 | Disposition: A | Discharge status: Home / Self Care | Payer: BC Managed Care – PPO | Attending: Emergency Medicine | Admitting: Emergency Medicine

## 2023-04-14 ENCOUNTER — Encounter (HOSPITAL_COMMUNITY): Payer: Self-pay

## 2023-04-14 DIAGNOSIS — M546 Pain in thoracic spine: Secondary | ICD-10-CM | POA: Insufficient documentation

## 2023-04-14 DIAGNOSIS — Y92410 Unspecified street and highway as the place of occurrence of the external cause: Secondary | ICD-10-CM | POA: Insufficient documentation

## 2023-04-14 DIAGNOSIS — M545 Low back pain, unspecified: Secondary | ICD-10-CM | POA: Insufficient documentation

## 2023-04-14 DIAGNOSIS — S92301A Fracture of unspecified metatarsal bone(s), right foot, initial encounter for closed fracture: Secondary | ICD-10-CM

## 2023-04-14 DIAGNOSIS — R0789 Other chest pain: Secondary | ICD-10-CM | POA: Insufficient documentation

## 2023-04-14 DIAGNOSIS — S92341A Displaced fracture of fourth metatarsal bone, right foot, initial encounter for closed fracture: Secondary | ICD-10-CM | POA: Insufficient documentation

## 2023-04-14 DIAGNOSIS — S92351A Displaced fracture of fifth metatarsal bone, right foot, initial encounter for closed fracture: Secondary | ICD-10-CM | POA: Diagnosis not present

## 2023-04-14 DIAGNOSIS — R1084 Generalized abdominal pain: Secondary | ICD-10-CM | POA: Insufficient documentation

## 2023-04-14 DIAGNOSIS — S90121A Contusion of right lesser toe(s) without damage to nail, initial encounter: Secondary | ICD-10-CM | POA: Diagnosis not present

## 2023-04-14 DIAGNOSIS — M79671 Pain in right foot: Secondary | ICD-10-CM | POA: Diagnosis not present

## 2023-04-14 LAB — I-STAT CHEM 8, ED
BUN: 17 mg/dL (ref 6–20)
Calcium, Ion: 1.13 mmol/L — ABNORMAL LOW (ref 1.15–1.40)
Chloride: 100 mmol/L (ref 98–111)
Creatinine, Ser: 0.8 mg/dL (ref 0.61–1.24)
Glucose, Bld: 117 mg/dL — ABNORMAL HIGH (ref 70–99)
HCT: 35 % — ABNORMAL LOW (ref 39.0–52.0)
Hemoglobin: 11.9 g/dL — ABNORMAL LOW (ref 13.0–17.0)
Potassium: 4 mmol/L (ref 3.5–5.1)
Sodium: 137 mmol/L (ref 135–145)
TCO2: 27 mmol/L (ref 22–32)

## 2023-04-14 MED ORDER — SODIUM CHLORIDE 0.9 % IV SOLN
1000.0000 mL | INTRAVENOUS | Status: DC
  Administered 2023-04-14: 1000 mL via INTRAVENOUS

## 2023-04-14 MED ORDER — MORPHINE SULFATE (PF) 4 MG/ML IV SOLN
4.0000 mg | Freq: Once | INTRAVENOUS | Status: AC
  Administered 2023-04-14: 4 mg via INTRAVENOUS
  Filled 2023-04-14: qty 1

## 2023-04-14 MED ORDER — IOHEXOL 350 MG/ML SOLN
75.0000 mL | Freq: Once | INTRAVENOUS | Status: AC | PRN
  Administered 2023-04-14: 75 mL via INTRAVENOUS

## 2023-04-14 MED ORDER — SODIUM CHLORIDE 0.9 % IV BOLUS (SEPSIS)
500.0000 mL | Freq: Once | INTRAVENOUS | Status: AC
  Administered 2023-04-14: 500 mL via INTRAVENOUS

## 2023-04-14 NOTE — Discharge Instructions (Addendum)
Use your crutches at home and keep the cam walker boot on for your fourth toe metatarsal fracture.  Follow-up with your orthopedic doctor this coming week to be rechecked.  Apply ice and try to keep it elevated
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
## 2023-04-16 ENCOUNTER — Telehealth: Payer: Self-pay

## 2023-04-16 ENCOUNTER — Other Ambulatory Visit: Payer: Self-pay | Admitting: Physician Assistant

## 2023-04-16 DIAGNOSIS — R41840 Attention and concentration deficit: Secondary | ICD-10-CM

## 2023-04-16 MED ORDER — AMPHETAMINE-DEXTROAMPHETAMINE 20 MG PO TABS
20.0000 mg | ORAL_TABLET | Freq: Two times a day (BID) | ORAL | 0 refills | Status: DC
Start: 2023-04-16 — End: 2023-05-01

## 2023-04-16 NOTE — Telephone Encounter (Signed)
Pt  wife notified that we send med

## 2023-04-16 NOTE — Telephone Encounter (Signed)
Pt advised.

## 2023-04-24 DIAGNOSIS — E782 Mixed hyperlipidemia: Secondary | ICD-10-CM | POA: Diagnosis not present

## 2023-04-24 DIAGNOSIS — R5383 Other fatigue: Secondary | ICD-10-CM | POA: Diagnosis not present

## 2023-04-24 DIAGNOSIS — R7989 Other specified abnormal findings of blood chemistry: Secondary | ICD-10-CM | POA: Diagnosis not present

## 2023-04-24 DIAGNOSIS — E538 Deficiency of other specified B group vitamins: Secondary | ICD-10-CM | POA: Diagnosis not present

## 2023-04-24 DIAGNOSIS — E559 Vitamin D deficiency, unspecified: Secondary | ICD-10-CM | POA: Diagnosis not present

## 2023-04-25 LAB — CBC WITH DIFFERENTIAL/PLATELET
Basophils Absolute: 0.1 10*3/uL (ref 0.0–0.2)
Basos: 1 %
EOS (ABSOLUTE): 0.3 10*3/uL (ref 0.0–0.4)
Eos: 6 %
Hematocrit: 37 % — ABNORMAL LOW (ref 37.5–51.0)
Hemoglobin: 12.5 g/dL — ABNORMAL LOW (ref 13.0–17.7)
Immature Grans (Abs): 0 10*3/uL (ref 0.0–0.1)
Immature Granulocytes: 0 %
Lymphocytes Absolute: 1.8 10*3/uL (ref 0.7–3.1)
Lymphs: 37 %
MCH: 30 pg (ref 26.6–33.0)
MCHC: 33.8 g/dL (ref 31.5–35.7)
MCV: 89 fL (ref 79–97)
Monocytes Absolute: 0.5 10*3/uL (ref 0.1–0.9)
Monocytes: 10 %
Neutrophils Absolute: 2.2 10*3/uL (ref 1.4–7.0)
Neutrophils: 46 %
Platelets: 298 10*3/uL (ref 150–450)
RBC: 4.16 x10E6/uL (ref 4.14–5.80)
RDW: 12.7 % (ref 11.6–15.4)
WBC: 4.8 10*3/uL (ref 3.4–10.8)

## 2023-04-25 LAB — COMPREHENSIVE METABOLIC PANEL
ALT: 10 IU/L (ref 0–44)
AST: 11 IU/L (ref 0–40)
Albumin/Globulin Ratio: 1.8 (ref 1.2–2.2)
Albumin: 4.4 g/dL (ref 4.1–5.1)
Alkaline Phosphatase: 148 IU/L — ABNORMAL HIGH (ref 44–121)
BUN/Creatinine Ratio: 21 — ABNORMAL HIGH (ref 9–20)
BUN: 19 mg/dL (ref 6–24)
Bilirubin Total: 1.2 mg/dL (ref 0.0–1.2)
CO2: 27 mmol/L (ref 20–29)
Calcium: 9.2 mg/dL (ref 8.7–10.2)
Chloride: 101 mmol/L (ref 96–106)
Creatinine, Ser: 0.91 mg/dL (ref 0.76–1.27)
Globulin, Total: 2.5 g/dL (ref 1.5–4.5)
Glucose: 105 mg/dL — ABNORMAL HIGH (ref 70–99)
Potassium: 4.4 mmol/L (ref 3.5–5.2)
Sodium: 139 mmol/L (ref 134–144)
Total Protein: 6.9 g/dL (ref 6.0–8.5)
eGFR: 107 mL/min/{1.73_m2} (ref >59)

## 2023-04-25 LAB — LIPID PANEL WITH LDL/HDL RATIO
Cholesterol, Total: 160 mg/dL (ref 100–199)
HDL: 42 mg/dL (ref >39)
LDL Chol Calc (NIH): 107 mg/dL — ABNORMAL HIGH (ref 0–99)
LDL/HDL Ratio: 2.5 ratio (ref 0.0–3.6)
Triglycerides: 53 mg/dL (ref 0–149)
VLDL Cholesterol Cal: 11 mg/dL (ref 5–40)

## 2023-04-25 LAB — VITAMIN D 25 HYDROXY (VIT D DEFICIENCY, FRACTURES): Vit D, 25-Hydroxy: 29 ng/mL — ABNORMAL LOW (ref 30.0–100.0)

## 2023-04-25 LAB — B12 AND FOLATE PANEL
Folate: 9.3 ng/mL (ref >3.0)
Vitamin B-12: 1061 pg/mL (ref 232–1245)

## 2023-04-25 LAB — TSH+FREE T4
Free T4: 1.35 ng/dL (ref 0.82–1.77)
TSH: 1.88 u[IU]/mL (ref 0.450–4.500)

## 2023-05-01 ENCOUNTER — Encounter: Payer: Self-pay | Admitting: Physician Assistant

## 2023-05-01 ENCOUNTER — Ambulatory Visit (INDEPENDENT_AMBULATORY_CARE_PROVIDER_SITE_OTHER): Payer: BC Managed Care – PPO | Admitting: Physician Assistant

## 2023-05-01 VITALS — BP 120/86 | HR 83 | Temp 98.5°F | Resp 16 | Ht 72.0 in | Wt 183.8 lb

## 2023-05-01 DIAGNOSIS — R41840 Attention and concentration deficit: Secondary | ICD-10-CM

## 2023-05-01 DIAGNOSIS — E559 Vitamin D deficiency, unspecified: Secondary | ICD-10-CM

## 2023-05-01 DIAGNOSIS — M792 Neuralgia and neuritis, unspecified: Secondary | ICD-10-CM | POA: Diagnosis not present

## 2023-05-01 DIAGNOSIS — R300 Dysuria: Secondary | ICD-10-CM | POA: Diagnosis not present

## 2023-05-01 DIAGNOSIS — E7800 Pure hypercholesterolemia, unspecified: Secondary | ICD-10-CM

## 2023-05-01 DIAGNOSIS — Z0001 Encounter for general adult medical examination with abnormal findings: Secondary | ICD-10-CM

## 2023-05-01 DIAGNOSIS — G4719 Other hypersomnia: Secondary | ICD-10-CM | POA: Diagnosis not present

## 2023-05-01 DIAGNOSIS — R7301 Impaired fasting glucose: Secondary | ICD-10-CM

## 2023-05-01 LAB — POCT GLYCOSYLATED HEMOGLOBIN (HGB A1C): Hemoglobin A1C: 5.3 % (ref 4.0–5.6)

## 2023-05-01 MED ORDER — AMPHETAMINE-DEXTROAMPHETAMINE 20 MG PO TABS: 20.0000 mg | ORAL_TABLET | Freq: Two times a day (BID) | ORAL | 0 refills | Status: DC

## 2023-05-01 MED ORDER — AMPHETAMINE-DEXTROAMPHETAMINE 20 MG PO TABS
20.0000 mg | ORAL_TABLET | Freq: Two times a day (BID) | ORAL | 0 refills | Status: DC
Start: 2023-07-12 — End: 2023-07-17

## 2023-05-01 NOTE — Progress Notes (Signed)
[facility] [address] [place], [place] [address]  Internal MEDICINE  Office Visit Note  Patient Name: Kevin Whitehead  [id number]  [id number]  Date of Service: 05/01/2023  Chief Complaint  Patient presents with   Annual Exam     HPI Pt is here for routine health maintenance examination -Doing well with adderall dosing, will be due for next set of 3 refills in 2 weeks -Labs show elevated fasting glucose, elevated LDL, low vit D supplement--will start OTC supplement -Will start working on diet more, admits to eating a lot of sugar and fried food and will adjust -he states he is having more regular BM now, most days, previously was once per week -He did have another accident last month and reports his right foot was impacted but is overall doing well now -Continues to see [place] neurosurgery -UTD on PHM  Current Medication: Outpatient Encounter Medications as of 05/01/2023  Medication Sig Note   acetaminophen (TYLENOL) 325 MG tablet Take 2 tablets (650 mg total) by mouth every 6 (six) hours as needed for mild pain (or Fever >/= 101).    Ascorbic Acid (VITAMIN C) 1000 MG tablet Take 1,000 mg by mouth daily.    bisacodyl (DULCOLAX) 10 MG suppository Place 1 suppository (10 mg total) rectally daily after supper.    buPROPion ER (WELLBUTRIN SR) 100 MG 12 hr tablet TAKE 1 TABLET BY MOUTH ONCE DAILY    cyanocobalamin 1000 MCG tablet Take 1,000 mcg by mouth daily. 5,000 mcg    diazepam (VALIUM) 5 MG tablet TAKE 1-2 TABLETS (5-10 MG TOTAL) BY MOUTH AT BEDTIME AS NEEDED FOR MUSCLE SPASMS.    docusate sodium (COLACE) 100 MG capsule Take 100 mg by mouth daily.    fluticasone (FLONASE) 50 MCG/ACT nasal spray Place 1 spray into both nostrils daily as needed for allergies or rhinitis.    hydrocortisone (ANUSOL-HC) 25 MG suppository PLACE ONE SUPPOSITORY RECTALLY TWO TIMES DAILY    meloxicam (MOBIC) 15 MG tablet Take by mouth.    Multiple Vitamin (MULTIVITAMIN) capsule Take  1 capsule by mouth daily.    oxyCODONE 10 MG TABS Take 1 tablet (10 mg total) by mouth every 3 (three) hours as needed for moderate pain. 12/06/2020: #50 on 11/28/20 #12 today LD 12/06/20   oxyCODONE ER (XTAMPZA ER) 13.5 MG C12A Take by mouth 2 (two) times daily.    polyethylene glycol (MIRALAX / GLYCOLAX) 17 g packet Take 17 g by mouth daily.    sildenafil (VIAGRA) 100 MG tablet Take 100 mg by mouth as needed.    [DISCONTINUED] amphetamine-dextroamphetamine (ADDERALL) 20 MG tablet Take 1 tablet (20 mg total) by mouth 2 (two) times daily.    [DISCONTINUED] amphetamine-dextroamphetamine (ADDERALL) 20 MG tablet Take 1 tablet (20 mg total) by mouth 2 (two) times daily as needed.    [DISCONTINUED] amphetamine-dextroamphetamine (ADDERALL) 20 MG tablet Take 1 tablet (20 mg total) by mouth 2 (two) times daily.    [DISCONTINUED] amphetamine-dextroamphetamine (ADDERALL) 20 MG tablet Take 1 tablet (20 mg total) by mouth 2 (two) times daily.    [START ON 05/15/2023] amphetamine-dextroamphetamine (ADDERALL) 20 MG tablet Take 1 tablet (20 mg total) by mouth 2 (two) times daily.    [START ON 06/13/2023] amphetamine-dextroamphetamine (ADDERALL) 20 MG tablet Take 1 tablet (20 mg total) by mouth 2 (two) times daily.    [START ON 07/12/2023] amphetamine-dextroamphetamine (ADDERALL) 20 MG tablet Take 1 tablet (20 mg total) by mouth 2 (two) times daily.    No facility-administered  encounter medications on file as of 05/01/2023.    Surgical History: Past Surgical History:  Procedure Laterality Date   ANKLE SURGERY  2016   POSTERIOR LUMBAR FUSION 4 LEVEL N/A 10/30/2020   Procedure: POSTERIOR LUMBAR FUSION 4 LEVEL;  Surgeon: Julio Sicks, MD;  Location: [facility] OR;  Service: Neurosurgery;  Laterality: N/A;  Lumbar one decompressive laminectomy with transpedicular decompression, Thoracic eleven through Lumbar three posterior lateral arthrodesis utilizing pedicle screw fixation and local autografting    Medical History: Past  Medical History:  Diagnosis Date   Attention and concentration deficit    Hemorrhoid     Family History: Family History  Problem Relation Age of Onset   COPD Father       Review of Systems  Constitutional:  Positive for fatigue. Negative for chills and unexpected weight change.  HENT:  Negative for congestion, postnasal drip, rhinorrhea, sneezing and sore throat.   Eyes:  Negative for redness.  Respiratory:  Negative for cough, chest tightness and shortness of breath.   Cardiovascular:  Negative for chest pain and palpitations.  Gastrointestinal:  Negative for abdominal pain, constipation, diarrhea, nausea and vomiting.  Genitourinary:  Positive for difficulty urinating. Negative for dysuria and frequency.       Self catheterization   Musculoskeletal:  Positive for arthralgias and back pain. Negative for joint swelling and neck pain.  Skin:  Negative for rash.  Neurological:  Positive for numbness. Negative for tremors.       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 Does not bruise/bleed easily.  Psychiatric/Behavioral:  Positive for decreased concentration. Negative for behavioral problems (Depression), sleep disturbance and suicidal ideas. The patient is not nervous/anxious.      Vital Signs: BP 120/86   Pulse 83   Temp 98.5 F (36.9 C)   Resp 16   Ht 6' (1.829 m)   Wt 183 lb 12.8 oz (83.4 kg)   SpO2 98%   BMI 24.93 kg/m    Physical Exam Vitals and nursing note reviewed.  Constitutional:      General: He is not in acute distress.    Appearance: He is well-developed and normal weight. He is not diaphoretic.  HENT:     Head: Normocephalic and atraumatic.     Right Ear: External ear normal.     Left Ear: External ear normal.     Nose: Nose normal.     Mouth/Throat:     Pharynx: No oropharyngeal exudate.  Eyes:     Conjunctiva/sclera: Conjunctivae normal.     Pupils: Pupils are  equal, round, and reactive to light.  Neck:     Thyroid: No thyromegaly.     Vascular: No JVD.     Trachea: No tracheal deviation.  Cardiovascular:     Rate and Rhythm: Normal rate and regular rhythm.     Heart sounds: Normal heart sounds. No murmur heard.    No friction rub. No gallop.  Pulmonary:     Effort: Pulmonary effort is normal. No respiratory distress.     Breath sounds: Normal breath sounds. No stridor. No wheezing or rales.  Chest:     Chest wall: No tenderness.  Abdominal:     General: Bowel sounds are normal.     Palpations: Abdomen is soft.     Tenderness: There is no abdominal tenderness.  Musculoskeletal:        General: No tenderness or deformity. Normal range of motion.  Cervical back: Normal range of motion and neck supple.     Comments: Limited ROM from back injury  Lymphadenopathy:     Cervical: No cervical adenopathy.  Skin:    General: Skin is warm and dry.     Coloration: Skin is not pale.     Findings: No erythema or rash.  Neurological:     Mental Status: He is alert.     Cranial Nerves: No cranial nerve deficit.     Sensory: Sensory deficit present.     Motor: No abnormal muscle tone.     Coordination: Coordination normal.     Deep Tendon Reflexes: Reflexes are normal and symmetric.  Psychiatric:        Behavior: Behavior normal.        Thought Content: Thought content normal.        Judgment: Judgment normal.      LABS: Recent Results (from the past 2160 hour(s))  I-stat chem 8, ED (not at [facility], [facility] or [facility])     Status: Abnormal   Collection Time: 04/14/23  8:55 AM  Result Value Ref Range   Sodium 137 135 - 145 mmol/L   Potassium 4.0 3.5 - 5.1 mmol/L   Chloride 100 98 - 111 mmol/L   BUN 17 6 - 20 mg/dL   Creatinine, Ser 1.61 0.61 - 1.24 mg/dL   Glucose, Bld 096 (H) 70 - 99 mg/dL    Comment: Glucose reference range applies only to samples taken after fasting for at least 8 hours.   Calcium, Ion 1.13 (L) 1.15 - 1.40 mmol/L   TCO2 27  22 - 32 mmol/L   Hemoglobin 11.9 (L) 13.0 - 17.0 g/dL   HCT 04.5 (L) 40.9 - 81.1 %  CBC w/Diff/Platelet     Status: Abnormal   Collection Time: 04/24/23  7:05 AM  Result Value Ref Range   WBC 4.8 3.4 - 10.8 x10E3/uL   RBC 4.16 4.14 - 5.80 x10E6/uL   Hemoglobin 12.5 (L) 13.0 - 17.7 g/dL   Hematocrit 91.4 (L) 78.2 - 51.0 %   MCV 89 79 - 97 fL   MCH 30.0 26.6 - 33.0 pg   MCHC 33.8 31.5 - 35.7 g/dL   RDW 95.6 21.3 - 08.6 %   Platelets 298 150 - 450 x10E3/uL   Neutrophils 46 Not Estab. %   Lymphs 37 Not Estab. %   Monocytes 10 Not Estab. %   Eos 6 Not Estab. %   Basos 1 Not Estab. %   Neutrophils Absolute 2.2 1.4 - 7.0 x10E3/uL   Lymphocytes Absolute 1.8 0.7 - 3.1 x10E3/uL   Monocytes Absolute 0.5 0.1 - 0.9 x10E3/uL   EOS (ABSOLUTE) 0.3 0.0 - 0.4 x10E3/uL   Basophils Absolute 0.1 0.0 - 0.2 x10E3/uL   Immature Granulocytes 0 Not Estab. %   Immature Grans (Abs) 0.0 0.0 - 0.1 x10E3/uL  Comprehensive metabolic panel     Status: Abnormal   Collection Time: 04/24/23  7:05 AM  Result Value Ref Range   Glucose 105 (H) 70 - 99 mg/dL   BUN 19 6 - 24 mg/dL   Creatinine, Ser 5.78 0.76 - 1.27 mg/dL   eGFR 469 >62 XB/MWU/1.32   BUN/Creatinine Ratio 21 (H) 9 - 20   Sodium 139 134 - 144 mmol/L   Potassium 4.4 3.5 - 5.2 mmol/L   Chloride 101 96 - 106 mmol/L   CO2 27 20 - 29 mmol/L   Calcium 9.2 8.7 - 10.2 mg/dL   Total  Protein 6.9 6.0 - 8.5 g/dL   Albumin 4.4 4.1 - 5.1 g/dL   Globulin, Total 2.5 1.5 - 4.5 g/dL   Albumin/Globulin Ratio 1.8 1.2 - 2.2   Bilirubin Total 1.2 0.0 - 1.2 mg/dL   Alkaline Phosphatase 148 (H) 44 - 121 IU/L   AST 11 0 - 40 IU/L   ALT 10 0 - 44 IU/L  TSH + free T4     Status: None   Collection Time: 04/24/23  7:05 AM  Result Value Ref Range   TSH 1.880 0.450 - 4.500 uIU/mL   Free T4 1.35 0.82 - 1.77 ng/dL  Lipid Panel With LDL/HDL Ratio     Status: Abnormal   Collection Time: 04/24/23  7:05 AM  Result Value Ref Range   Cholesterol, Total 160 100 - 199 mg/dL    Triglycerides 53 0 - 149 mg/dL   HDL 42 >40 mg/dL   VLDL Cholesterol Cal 11 5 - 40 mg/dL   LDL Chol Calc (NIH) 981 (H) 0 - 99 mg/dL   LDL/HDL Ratio 2.5 0.0 - 3.6 ratio    Comment:                                     LDL/HDL Ratio                                             Men  Women                               1/2 Avg.Risk  1.0    1.5                                   Avg.Risk  3.6    3.2                                2X Avg.Risk  6.2    5.0                                3X Avg.Risk  8.0    6.1   VITAMIN D 25 Hydroxy (Vit-D Deficiency, Fractures)     Status: Abnormal   Collection Time: 04/24/23  7:05 AM  Result Value Ref Range   Vit D, 25-Hydroxy 29.0 (L) 30.0 - 100.0 ng/mL    Comment: Vitamin D deficiency has been defined by the Institute of Medicine and an Endocrine Society practice guideline as a level of serum 25-OH vitamin D less than 20 ng/mL (1,2). The Endocrine Society went on to further define vitamin D insufficiency as a level between 21 and 29 ng/mL (2). 1. IOM (Institute of Medicine). 2010. Dietary reference    intakes for calcium and D. [place]: The    [employer]. 2. Holick MF, B[place], Bischoff-Ferrari HA, et al.    Evaluation, treatment, and prevention of vitamin D    deficiency: an Endocrine Society clinical practice    guideline. JCEM. 2011 Jul; 96(7):1911-30.   B12 and Folate Panel     Status: None  Collection Time: 04/24/23  7:05 AM  Result Value Ref Range   Vitamin B-12 1,061 232 - 1,245 pg/mL   Folate 9.3 >3.0 ng/mL    Comment: A serum folate concentration of less than 3.1 ng/mL is considered to represent clinical deficiency.   POCT HgB A1C     Status: None   Collection Time: 05/01/23 10:21 AM  Result Value Ref Range   Hemoglobin A1C 5.3 4.0 - 5.6 %   HbA1c POC (<> result, manual entry)     HbA1c, POC (prediabetic range)     HbA1c, POC (controlled diabetic range)          Assessment/Plan: 1. Encounter for general  adult medical examination with abnormal findings CT performed, routine labs reviewed, up-to-date on preventive health maintenance  2. Attention and concentration deficit As before, 3 months worth of refills sent - amphetamine-dextroamphetamine (ADDERALL) 20 MG tablet; Take 1 tablet (20 mg total) by mouth 2 (two) times daily.  Dispense: 60 tablet; Refill: 0 - amphetamine-dextroamphetamine (ADDERALL) 20 MG tablet; Take 1 tablet (20 mg total) by mouth 2 (two) times daily.  Dispense: 60 tablet; Refill: 0 - amphetamine-dextroamphetamine (ADDERALL) 20 MG tablet; Take 1 tablet (20 mg total) by mouth 2 (two) times daily.  Dispense: 60 tablet; Refill: 0 [place] Controlled Substance Database was reviewed by me for overdose risk score (ORS) Refilled Controlled medications today. Reviewed risks and possible side effects associated with taking Stimulants. Combination of these drugs with other psychotropic medications could cause dizziness and drowsiness. Pt needs to Monitor symptoms and exercise caution in driving and operating heavy machinery to avoid damages to oneself, to others and to the surroundings. Patient verbalized understanding in this matter. Dependence and abuse for these drugs will be monitored closely. A Controlled substance policy and procedure is on file which allows [facility] to order a urine drug screen test at any visit. Patient understands and agrees with the plan..  3. Excessive daytime sleepiness - amphetamine-dextroamphetamine (ADDERALL) 20 MG tablet; Take 1 tablet (20 mg total) by mouth 2 (two) times daily.  Dispense: 60 tablet; Refill: 0 - amphetamine-dextroamphetamine (ADDERALL) 20 MG tablet; Take 1 tablet (20 mg total) by mouth 2 (two) times daily.  Dispense: 60 tablet; Refill: 0  4. Neuropathic pain Followed by pain management  5. Hypercholesterolemia Improving diet and may consider fish oil supplement  6. Impaired fasting glucose - POCT HgB A1C 5.3 which is in normal  range however patient will be mindful of his sugars and work on diet   7. Vitamin D deficiency Will start over-the-counter vitamin D supplement  8. Dysuria - UA/M w/rflx Culture, Routine   General Counseling: Hamzah verbalizes understanding of the findings of todays visit and agrees with plan of treatment. I have discussed any further diagnostic evaluation that may be needed or ordered today. We also reviewed his medications today. he has been encouraged to call the office with any questions or concerns that should arise related to todays visit.    Counseling:    Orders Placed This Encounter  Procedures   UA/M w/rflx Culture, Routine   POCT HgB A1C    Meds ordered this encounter  Medications   amphetamine-dextroamphetamine (ADDERALL) 20 MG tablet    Sig: Take 1 tablet (20 mg total) by mouth 2 (two) times daily.    Dispense:  60 tablet    Refill:  0   amphetamine-dextroamphetamine (ADDERALL) 20 MG tablet    Sig: Take 1 tablet (20 mg total) by mouth 2 (two)  times daily.    Dispense:  60 tablet    Refill:  0   amphetamine-dextroamphetamine (ADDERALL) 20 MG tablet    Sig: Take 1 tablet (20 mg total) by mouth 2 (two) times daily.    Dispense:  60 tablet    Refill:  0    This patient was seen by Lynn Ito, PA-C in collaboration with Dr. Beverely Risen as a part of collaborative care agreement.  Total time spent:35 Minutes  Time spent includes review of chart, medications, test results, and follow up plan with the patient.     Lyndon Code, MD  Internal Medicine

## 2023-05-02 LAB — UA/M W/RFLX CULTURE, ROUTINE
Glucose, UA: NEGATIVE
Ketones, UA: NEGATIVE
Leukocytes,UA: NEGATIVE
Nitrite, UA: NEGATIVE
Protein,UA: NEGATIVE
Specific Gravity, UA: >=1.03 — AB (ref 1.005–1.030)
Urobilinogen, Ur: 1 mg/dL (ref 0.2–1.0)
pH, UA: 5 (ref 5.0–7.5)

## 2023-05-02 LAB — MICROSCOPIC EXAMINATION
Bacteria, UA: NONE SEEN
Casts: NONE SEEN /lpf
Epithelial Cells (non renal): NONE SEEN /hpf (ref 0–10)
WBC, UA: NONE SEEN /hpf (ref 0–5)

## 2023-05-06 ENCOUNTER — Telehealth: Payer: Self-pay

## 2023-05-06 DIAGNOSIS — R339 Retention of urine, unspecified: Secondary | ICD-10-CM | POA: Diagnosis not present

## 2023-05-06 NOTE — Telephone Encounter (Signed)
-----   Message from Carlean Jews, PA-C sent at 05/06/2023 12:37 PM EDT ----- Please let him know his urine showed some blood in it. This may be from the catheter, however I would like to recheck it in the future. He should let us know if any symptoms arise.

## 2023-05-06 NOTE — Telephone Encounter (Signed)
Left message for patient regarding urine culture results. 

## 2023-06-13 DIAGNOSIS — R339 Retention of urine, unspecified: Secondary | ICD-10-CM | POA: Diagnosis not present

## 2023-07-15 DIAGNOSIS — F1120 Opioid dependence, uncomplicated: Secondary | ICD-10-CM | POA: Diagnosis not present

## 2023-07-15 DIAGNOSIS — S32012S Unstable burst fracture of first lumbar vertebra, sequela: Secondary | ICD-10-CM | POA: Diagnosis not present

## 2023-07-17 ENCOUNTER — Encounter: Payer: Self-pay | Admitting: Nurse Practitioner

## 2023-07-17 ENCOUNTER — Ambulatory Visit: Payer: BC Managed Care – PPO | Admitting: Nurse Practitioner

## 2023-07-17 VITALS — BP 130/80 | HR 93 | Temp 98.3°F | Resp 16 | Ht 72.0 in | Wt 182.8 lb

## 2023-07-17 DIAGNOSIS — N390 Urinary tract infection, site not specified: Secondary | ICD-10-CM

## 2023-07-17 DIAGNOSIS — G4719 Other hypersomnia: Secondary | ICD-10-CM

## 2023-07-17 DIAGNOSIS — R41840 Attention and concentration deficit: Secondary | ICD-10-CM

## 2023-07-17 LAB — POCT URINALYSIS DIPSTICK
Bilirubin, UA: NEGATIVE
Glucose, UA: NEGATIVE
Nitrite, UA: NEGATIVE
Protein, UA: POSITIVE — AB
Spec Grav, UA: >=1.03 — AB (ref 1.010–1.025)
Urobilinogen, UA: 0.2 E.U./dL
pH, UA: 5 (ref 5.0–8.0)

## 2023-07-17 MED ORDER — AMPHETAMINE-DEXTROAMPHETAMINE 20 MG PO TABS: 20.0000 mg | ORAL_TABLET | Freq: Two times a day (BID) | ORAL | 0 refills | Status: DC

## 2023-07-17 MED ORDER — AMPHETAMINE-DEXTROAMPHETAMINE 20 MG PO TABS
20.0000 mg | ORAL_TABLET | Freq: Two times a day (BID) | ORAL | 0 refills | Status: DC
Start: 2023-08-14 — End: 2023-10-27

## 2023-07-17 MED ORDER — AMPHETAMINE-DEXTROAMPHETAMINE 20 MG PO TABS
20.0000 mg | ORAL_TABLET | Freq: Two times a day (BID) | ORAL | 0 refills | Status: DC
Start: 2023-07-17 — End: 2023-10-13

## 2023-07-17 MED ORDER — SULFAMETHOXAZOLE-TRIMETHOPRIM 800-160 MG PO TABS
1.0000 | ORAL_TABLET | Freq: Two times a day (BID) | ORAL | 0 refills | Status: AC
Start: 2023-07-17 — End: 2023-07-24

## 2023-07-17 NOTE — Progress Notes (Signed)
[facility] [address] [place], [place] [address]  Internal MEDICINE  Office Visit Note  Patient Name: Kevin Whitehead  [id number]  [id number]  Date of Service: 07/17/2023  Chief Complaint  Patient presents with   Acute Visit     HPI Aryash presents for an acute sick visit for UTI but also needs refills.  UTI --does self-caths  due to prior back injury. Increase risk of infection. Has been having blood in urine, burning with urination, urgency and frequency. Urinalysis has leukocytes and blood today. Has not had any antibiotics recently.  ADHD -- takes adderall for ADHD, due for refills. Heart rate and BP are normal. Current dose is effective per patient. Denies any palpitations or other side effects of the medication.  Low back/tailbone pain -- having a new pain in the low back/tailbone, not sure if it is his spine or something else due to loss of some feeling. Discussed calling his spine doctor to notify them and have him evaluate it and patient agrees with this suggestion.      Current Medication:  Outpatient Encounter Medications as of 07/17/2023  Medication Sig Note   acetaminophen (TYLENOL) 325 MG tablet Take 2 tablets (650 mg total) by mouth every 6 (six) hours as needed for mild pain (or Fever >/= 101).    Ascorbic Acid (VITAMIN C) 1000 MG tablet Take 1,000 mg by mouth daily.    bisacodyl (DULCOLAX) 10 MG suppository Place 1 suppository (10 mg total) rectally daily after supper.    buPROPion ER (WELLBUTRIN SR) 100 MG 12 hr tablet TAKE 1 TABLET BY MOUTH ONCE DAILY    cyanocobalamin 1000 MCG tablet Take 1,000 mcg by mouth daily. 5,000 mcg    diazepam (VALIUM) 5 MG tablet TAKE 1-2 TABLETS (5-10 MG TOTAL) BY MOUTH AT BEDTIME AS NEEDED FOR MUSCLE SPASMS.    docusate sodium (COLACE) 100 MG capsule Take 100 mg by mouth daily.    fluticasone (FLONASE) 50 MCG/ACT nasal spray Place 1 spray into both nostrils daily as needed for allergies or rhinitis.     hydrocortisone (ANUSOL-HC) 25 MG suppository PLACE ONE SUPPOSITORY RECTALLY TWO TIMES DAILY    meloxicam (MOBIC) 15 MG tablet Take by mouth.    Multiple Vitamin (MULTIVITAMIN) capsule Take 1 capsule by mouth daily.    oxyCODONE 10 MG TABS Take 1 tablet (10 mg total) by mouth every 3 (three) hours as needed for moderate pain. 12/06/2020: #50 on 11/28/20 #12 today LD 12/06/20   oxyCODONE ER (XTAMPZA ER) 13.5 MG C12A Take by mouth 2 (two) times daily.    polyethylene glycol (MIRALAX / GLYCOLAX) 17 g packet Take 17 g by mouth daily.    sildenafil (VIAGRA) 100 MG tablet Take 100 mg by mouth as needed.    sulfamethoxazole-trimethoprim (BACTRIM DS) 800-160 MG tablet Take 1 tablet by mouth 2 (two) times daily for 7 days.    [DISCONTINUED] amphetamine-dextroamphetamine (ADDERALL) 20 MG tablet Take 1 tablet (20 mg total) by mouth 2 (two) times daily.    [DISCONTINUED] amphetamine-dextroamphetamine (ADDERALL) 20 MG tablet Take 1 tablet (20 mg total) by mouth 2 (two) times daily.    [DISCONTINUED] amphetamine-dextroamphetamine (ADDERALL) 20 MG tablet Take 1 tablet (20 mg total) by mouth 2 (two) times daily.    amphetamine-dextroamphetamine (ADDERALL) 20 MG tablet Take 1 tablet (20 mg total) by mouth 2 (two) times daily.    [START ON 08/14/2023] amphetamine-dextroamphetamine (ADDERALL) 20 MG tablet Take 1 tablet (20 mg total) by mouth 2 (two) times  daily.    [START ON 09/11/2023] amphetamine-dextroamphetamine (ADDERALL) 20 MG tablet Take 1 tablet (20 mg total) by mouth 2 (two) times daily.    No facility-administered encounter medications on file as of 07/17/2023.      Medical History: Past Medical History:  Diagnosis Date   Attention and concentration deficit    Hemorrhoid      Vital Signs: BP 130/80   Pulse 93   Temp 98.3 F (36.8 C)   Resp 16   Ht 6' (1.829 m)   Wt 182 lb 12.8 oz (82.9 kg)   SpO2 98%   BMI 24.79 kg/m    Review of Systems  Constitutional:  Positive for fatigue. Negative for  chills and unexpected weight change.  HENT:  Negative for congestion, postnasal drip, rhinorrhea, sneezing and sore throat.   Eyes:  Negative for redness.  Respiratory:  Negative for cough, chest tightness and shortness of breath.   Cardiovascular:  Negative for chest pain and palpitations.  Gastrointestinal:  Negative for abdominal pain, constipation, diarrhea, nausea and vomiting.  Genitourinary:  Positive for difficulty urinating. Negative for dysuria and frequency.       Self catheterization   Musculoskeletal:  Positive for arthralgias and back pain. Negative for joint swelling and neck pain.  Skin:  Negative for rash.  Neurological:  Positive for numbness. Negative for tremors.       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 Does not bruise/bleed easily.  Psychiatric/Behavioral:  Positive for decreased concentration. Negative for behavioral problems (Depression), sleep disturbance and suicidal ideas. The patient is not nervous/anxious.     Physical Exam Vitals reviewed.  Constitutional:      General: He is not in acute distress.    Appearance: He is well-developed and normal weight. He is not ill-appearing or diaphoretic.  HENT:     Head: Normocephalic and atraumatic.  Eyes:     Pupils: Pupils are equal, round, and reactive to light.  Neck:     Thyroid: No thyromegaly.     Vascular: No JVD.     Trachea: No tracheal deviation.  Cardiovascular:     Rate and Rhythm: Normal rate and regular rhythm.  Pulmonary:     Effort: Pulmonary effort is normal. No respiratory distress.  Abdominal:     General: Bowel sounds are normal.     Palpations: Abdomen is soft.  Musculoskeletal:     Comments: Limited ROM from back injury  Neurological:     Mental Status: He is alert and oriented to person, place, and time.     Cranial Nerves: No cranial nerve deficit.     Sensory: Sensory deficit present.     Motor: No  abnormal muscle tone.     Coordination: Coordination normal.     Deep Tendon Reflexes: Reflexes are normal and symmetric.  Psychiatric:        Mood and Affect: Mood normal.        Behavior: Behavior normal.       Assessment/Plan: 1. Urinary tract infection without hematuria, site unspecified Bactrim prescribed empirically. Urine culture sent.  - POCT Urinalysis Dipstick - sulfamethoxazole-trimethoprim (BACTRIM DS) 800-160 MG tablet; Take 1 tablet by mouth 2 (two) times daily for 7 days.  Dispense: 14 tablet; Refill: 0 - urine culture comprehensive.   2. Attention and concentration deficit Continue adderall as prescribed. Follow up in 3 months for additional refills.  - amphetamine-dextroamphetamine (ADDERALL) 20 MG tablet; Take 1 tablet (20  mg total) by mouth 2 (two) times daily.  Dispense: 60 tablet; Refill: 0 - amphetamine-dextroamphetamine (ADDERALL) 20 MG tablet; Take 1 tablet (20 mg total) by mouth 2 (two) times daily.  Dispense: 60 tablet; Refill: 0 - amphetamine-dextroamphetamine (ADDERALL) 20 MG tablet; Take 1 tablet (20 mg total) by mouth 2 (two) times daily.  Dispense: 60 tablet; Refill: 0  3. Excessive daytime sleepiness Continue adderall as prescribed. Follow up in 3 months for additional refills.  - amphetamine-dextroamphetamine (ADDERALL) 20 MG tablet; Take 1 tablet (20 mg total) by mouth 2 (two) times daily.  Dispense: 60 tablet; Refill: 0 - amphetamine-dextroamphetamine (ADDERALL) 20 MG tablet; Take 1 tablet (20 mg total) by mouth 2 (two) times daily.  Dispense: 60 tablet; Refill: 0 - amphetamine-dextroamphetamine (ADDERALL) 20 MG tablet; Take 1 tablet (20 mg total) by mouth 2 (two) times daily.  Dispense: 60 tablet; Refill: 0    General Counseling: Aspen verbalizes understanding of the findings of todays visit and agrees with plan of treatment. I have discussed any further diagnostic evaluation that may be needed or ordered today. We also reviewed his medications  today. he has been encouraged to call the office with any questions or concerns that should arise related to todays visit.    Counseling:    Orders Placed This Encounter  Procedures   POCT Urinalysis Dipstick    Meds ordered this encounter  Medications   sulfamethoxazole-trimethoprim (BACTRIM DS) 800-160 MG tablet    Sig: Take 1 tablet by mouth 2 (two) times daily for 7 days.    Dispense:  14 tablet    Refill:  0   amphetamine-dextroamphetamine (ADDERALL) 20 MG tablet    Sig: Take 1 tablet (20 mg total) by mouth 2 (two) times daily.    Dispense:  60 tablet    Refill:  0    Fill for 07/17/23   amphetamine-dextroamphetamine (ADDERALL) 20 MG tablet    Sig: Take 1 tablet (20 mg total) by mouth 2 (two) times daily.    Dispense:  60 tablet    Refill:  0    Fill for 08/14/23   amphetamine-dextroamphetamine (ADDERALL) 20 MG tablet    Sig: Take 1 tablet (20 mg total) by mouth 2 (two) times daily.    Dispense:  60 tablet    Refill:  0    Fill for 09/11/23    Return in about 3 months (around 10/08/2023) for F/U, ADHD med check with Lauren PCP, and cancel appt next week.  [place] Controlled Substance Database was reviewed by me for overdose risk score (ORS)  Time spent:30 Minutes Time spent with patient included reviewing progress notes, labs, imaging studies, and discussing plan for follow up.   This patient was seen by Sallyanne Kuster, FNP-C in collaboration with Dr. Beverely Risen as a part of collaborative care agreement.  Jevonte Clanton R. Tedd Sias, MSN, FNP-C Internal Medicine

## 2023-07-18 DIAGNOSIS — R339 Retention of urine, unspecified: Secondary | ICD-10-CM | POA: Diagnosis not present

## 2023-07-23 LAB — CULTURE, URINE COMPREHENSIVE

## 2023-07-28 ENCOUNTER — Ambulatory Visit: Payer: Self-pay | Admitting: Physician Assistant

## 2023-08-20 ENCOUNTER — Telehealth: Payer: Self-pay

## 2023-08-20 NOTE — Telephone Encounter (Signed)
Pt wife  called that pt still have UTI symptoms advised her to call urology

## 2023-09-15 ENCOUNTER — Telehealth: Payer: Self-pay

## 2023-09-15 NOTE — Telephone Encounter (Addendum)
Completed P.A. for patient's Adderall. Approved, notified patient's wife.

## 2023-10-06 ENCOUNTER — Ambulatory Visit: Payer: BC Managed Care – PPO | Admitting: Physician Assistant

## 2023-10-13 ENCOUNTER — Other Ambulatory Visit: Payer: Self-pay | Admitting: Physician Assistant

## 2023-10-13 ENCOUNTER — Ambulatory Visit (INDEPENDENT_AMBULATORY_CARE_PROVIDER_SITE_OTHER): Payer: BC Managed Care – PPO

## 2023-10-13 DIAGNOSIS — R41840 Attention and concentration deficit: Secondary | ICD-10-CM

## 2023-10-13 DIAGNOSIS — R319 Hematuria, unspecified: Secondary | ICD-10-CM | POA: Diagnosis not present

## 2023-10-13 DIAGNOSIS — N3001 Acute cystitis with hematuria: Secondary | ICD-10-CM

## 2023-10-13 DIAGNOSIS — N390 Urinary tract infection, site not specified: Secondary | ICD-10-CM | POA: Diagnosis not present

## 2023-10-13 DIAGNOSIS — G4719 Other hypersomnia: Secondary | ICD-10-CM

## 2023-10-13 DIAGNOSIS — R300 Dysuria: Secondary | ICD-10-CM

## 2023-10-13 LAB — POCT URINALYSIS DIPSTICK
Bilirubin, UA: NEGATIVE
Blood, UA: POSITIVE
Glucose, UA: NEGATIVE
Ketones, UA: NEGATIVE
Nitrite, UA: POSITIVE
Protein, UA: NEGATIVE
Spec Grav, UA: 1.01 (ref 1.010–1.025)
Urobilinogen, UA: 0.2 U/dL
pH, UA: 5 (ref 5.0–8.0)

## 2023-10-13 MED ORDER — SULFAMETHOXAZOLE-TRIMETHOPRIM 800-160 MG PO TABS
1.0000 | ORAL_TABLET | Freq: Two times a day (BID) | ORAL | 0 refills | Status: DC
Start: 2023-10-13 — End: 2024-01-29

## 2023-10-13 MED ORDER — AMPHETAMINE-DEXTROAMPHETAMINE 20 MG PO TABS
20.0000 mg | ORAL_TABLET | Freq: Two times a day (BID) | ORAL | 0 refills | Status: DC
Start: 2023-10-13 — End: 2023-10-27

## 2023-10-13 NOTE — Progress Notes (Signed)
Patient here for UTI. Lauren has sent ABX.

## 2023-10-15 ENCOUNTER — Telehealth: Payer: Self-pay | Admitting: Physician Assistant

## 2023-10-15 NOTE — Telephone Encounter (Signed)
Received catheter order form from [facility]. Faxed unsigned order back requesting them to fax order to patient's urologist office @ [phone]

## 2023-10-15 NOTE — Telephone Encounter (Signed)
Lvm to verify patient's urologist-Toni

## 2023-10-16 LAB — CULTURE, URINE COMPREHENSIVE

## 2023-10-20 ENCOUNTER — Telehealth: Payer: Self-pay

## 2023-10-20 NOTE — Telephone Encounter (Signed)
Left message for patient regarding urine culture results. 

## 2023-10-20 NOTE — Telephone Encounter (Signed)
-----   Message from Carlean Jews sent at 10/20/2023 12:40 PM EST ----- Please let him know his culture did come back with UTI and he was already on the correct ABX

## 2023-10-22 ENCOUNTER — Telehealth: Payer: Self-pay | Admitting: Physician Assistant

## 2023-10-22 NOTE — Telephone Encounter (Signed)
Received cath order from [facility] again. Faxed back to them requesting they fax order to patient's urologist, [facility]. [phone]) [phone]

## 2023-10-27 ENCOUNTER — Ambulatory Visit: Payer: BC Managed Care – PPO | Admitting: Physician Assistant

## 2023-10-27 ENCOUNTER — Telehealth: Payer: Self-pay | Admitting: Physician Assistant

## 2023-10-27 ENCOUNTER — Encounter: Payer: Self-pay | Admitting: Physician Assistant

## 2023-10-27 VITALS — BP 122/80 | HR 94 | Temp 98.4°F | Resp 16 | Ht 72.0 in | Wt 190.2 lb

## 2023-10-27 DIAGNOSIS — R41840 Attention and concentration deficit: Secondary | ICD-10-CM | POA: Diagnosis not present

## 2023-10-27 DIAGNOSIS — G4719 Other hypersomnia: Secondary | ICD-10-CM | POA: Diagnosis not present

## 2023-10-27 DIAGNOSIS — R300 Dysuria: Secondary | ICD-10-CM | POA: Diagnosis not present

## 2023-10-27 DIAGNOSIS — R0602 Shortness of breath: Secondary | ICD-10-CM

## 2023-10-27 DIAGNOSIS — N390 Urinary tract infection, site not specified: Secondary | ICD-10-CM | POA: Diagnosis not present

## 2023-10-27 DIAGNOSIS — G4734 Idiopathic sleep related nonobstructive alveolar hypoventilation: Secondary | ICD-10-CM | POA: Diagnosis not present

## 2023-10-27 DIAGNOSIS — Z79899 Other long term (current) drug therapy: Secondary | ICD-10-CM

## 2023-10-27 DIAGNOSIS — R319 Hematuria, unspecified: Secondary | ICD-10-CM

## 2023-10-27 LAB — POCT URINALYSIS DIPSTICK
Glucose, UA: NEGATIVE
Nitrite, UA: NEGATIVE
Protein, UA: NEGATIVE
Spec Grav, UA: 1.015 (ref 1.010–1.025)
Urobilinogen, UA: 1 U/dL
pH, UA: 5 (ref 5.0–8.0)

## 2023-10-27 LAB — POCT URINE DRUG SCREEN
Methylenedioxyamphetamine: NOT DETECTED
POC Amphetamine UR: POSITIVE — AB
POC BENZODIAZEPINES UR: NOT DETECTED
POC Barbiturate UR: NOT DETECTED
POC Cocaine UR: NOT DETECTED
POC Ecstasy UR: NOT DETECTED
POC Marijuana UR: NOT DETECTED
POC Methadone UR: NOT DETECTED
POC Methamphetamine UR: NOT DETECTED
POC Opiate Ur: NOT DETECTED
POC Oxycodone UR: POSITIVE — AB
POC PHENCYCLIDINE UR: NOT DETECTED
POC TRICYCLICS UR: NOT DETECTED

## 2023-10-27 MED ORDER — AMPHETAMINE-DEXTROAMPHETAMINE 20 MG PO TABS
20.0000 mg | ORAL_TABLET | Freq: Two times a day (BID) | ORAL | 0 refills | Status: DC
Start: 2023-11-26 — End: 2024-01-29

## 2023-10-27 MED ORDER — AMPHETAMINE-DEXTROAMPHETAMINE 20 MG PO TABS: 20.0000 mg | ORAL_TABLET | Freq: Two times a day (BID) | ORAL | 0 refills | Status: DC

## 2023-10-27 MED ORDER — AMPHETAMINE-DEXTROAMPHETAMINE 20 MG PO TABS
20.0000 mg | ORAL_TABLET | Freq: Two times a day (BID) | ORAL | 0 refills | Status: DC
Start: 2023-10-27 — End: 2024-01-22

## 2023-10-27 NOTE — Telephone Encounter (Signed)
Notified [facility] of overnight pulseox order-Toni

## 2023-10-27 NOTE — Progress Notes (Signed)
[facility] [address] [place], [place] [address]  Internal MEDICINE  Office Visit Note  Patient Name: Kevin Whitehead  [id number]  [id number]  Date of Service: 11/04/2023  Chief Complaint  Patient presents with   Follow-up    HPI Pt is here for routine follow up -Doing well with adderall, no side effects, needs refills -pain management is trying to increase ER pain meds and decrease the prn meds, but dealing with new insurance -getting more SOB with exertion. Oxygen at rest has been ok. May need further pulmonary work up. Will start with checking ONO given chronic pain meds which could impact respiration at night. Discussed sleep study, but pt would prefer to start with ONO first. Will also check PFT -urinary symptoms improved with recent ABX, will send urine to ensure cleared  Current Medication: Outpatient Encounter Medications as of 10/27/2023  Medication Sig Note   acetaminophen (TYLENOL) 325 MG tablet Take 2 tablets (650 mg total) by mouth every 6 (six) hours as needed for mild pain (or Fever >/= 101).    Ascorbic Acid (VITAMIN C) 1000 MG tablet Take 1,000 mg by mouth daily.    bisacodyl (DULCOLAX) 10 MG suppository Place 1 suppository (10 mg total) rectally daily after supper.    buPROPion ER (WELLBUTRIN SR) 100 MG 12 hr tablet TAKE 1 TABLET BY MOUTH ONCE DAILY    cyanocobalamin 1000 MCG tablet Take 1,000 mcg by mouth daily. 5,000 mcg    diazepam (VALIUM) 5 MG tablet TAKE 1-2 TABLETS (5-10 MG TOTAL) BY MOUTH AT BEDTIME AS NEEDED FOR MUSCLE SPASMS.    docusate sodium (COLACE) 100 MG capsule Take 100 mg by mouth daily.    fluticasone (FLONASE) 50 MCG/ACT nasal spray Place 1 spray into both nostrils daily as needed for allergies or rhinitis.    hydrocortisone (ANUSOL-HC) 25 MG suppository PLACE ONE SUPPOSITORY RECTALLY TWO TIMES DAILY    meloxicam (MOBIC) 15 MG tablet Take by mouth.    Multiple Vitamin (MULTIVITAMIN) capsule Take 1 capsule by mouth daily.     oxyCODONE 10 MG TABS Take 1 tablet (10 mg total) by mouth every 3 (three) hours as needed for moderate pain. 12/06/2020: #50 on 11/28/20 #12 today LD 12/06/20   oxyCODONE ER (XTAMPZA ER) 13.5 MG C12A Take by mouth 2 (two) times daily.    polyethylene glycol (MIRALAX / GLYCOLAX) 17 g packet Take 17 g by mouth daily.    sildenafil (VIAGRA) 100 MG tablet Take 100 mg by mouth as needed.    sulfamethoxazole-trimethoprim (BACTRIM DS) 800-160 MG tablet Take 1 tablet by mouth 2 (two) times daily.    [DISCONTINUED] amphetamine-dextroamphetamine (ADDERALL) 20 MG tablet Take 1 tablet (20 mg total) by mouth 2 (two) times daily.    [DISCONTINUED] amphetamine-dextroamphetamine (ADDERALL) 20 MG tablet Take 1 tablet (20 mg total) by mouth 2 (two) times daily.    [DISCONTINUED] amphetamine-dextroamphetamine (ADDERALL) 20 MG tablet Take 1 tablet (20 mg total) by mouth 2 (two) times daily.    amphetamine-dextroamphetamine (ADDERALL) 20 MG tablet Take 1 tablet (20 mg total) by mouth 2 (two) times daily.    [START ON 11/26/2023] amphetamine-dextroamphetamine (ADDERALL) 20 MG tablet Take 1 tablet (20 mg total) by mouth 2 (two) times daily.    [START ON 12/26/2023] amphetamine-dextroamphetamine (ADDERALL) 20 MG tablet Take 1 tablet (20 mg total) by mouth 2 (two) times daily.    No facility-administered encounter medications on file as of 10/27/2023.    Surgical History: Past Surgical History:  Procedure  Laterality Date   ANKLE SURGERY  2016   POSTERIOR LUMBAR FUSION 4 LEVEL N/A 10/30/2020   Procedure: POSTERIOR LUMBAR FUSION 4 LEVEL;  Surgeon: Julio Sicks, MD;  Location: [facility] OR;  Service: Neurosurgery;  Laterality: N/A;  Lumbar one decompressive laminectomy with transpedicular decompression, Thoracic eleven through Lumbar three posterior lateral arthrodesis utilizing pedicle screw fixation and local autografting    Medical History: Past Medical History:  Diagnosis Date   Attention and concentration deficit    Hemorrhoid      Family History: Family History  Problem Relation Age of Onset   COPD Father     Social History   Socioeconomic History   Marital status: Married    Spouse name: Not on file   Number of children: Not on file   Years of education: Not on file   Highest education level: Not on file  Occupational History   Not on file  Tobacco Use   Smoking status: Former    Current packs/day: 0.00    Types: Cigarettes    Quit date: 2017    Years since quitting: 7.9   Smokeless tobacco: Never  Vaping Use   Vaping status: Never Used  Substance and Sexual Activity   Alcohol use: No   Drug use: No   Sexual activity: Not on file  Other Topics Concern   Not on file  Social History Narrative   Not on file   Social Drivers of Health   Financial Resource Strain: Not on file  Food Insecurity: Not on file  Transportation Needs: Not on file  Physical Activity: Not on file  Stress: Not on file  Social Connections: Not on file  Intimate Partner Violence: Not on file      Review of Systems  Constitutional:  Positive for fatigue. Negative for chills and unexpected weight change.  HENT:  Negative for congestion, postnasal drip, rhinorrhea, sneezing and sore throat.   Eyes:  Negative for redness.  Respiratory:  Positive for shortness of breath. Negative for cough and chest tightness.   Cardiovascular:  Negative for chest pain and palpitations.  Gastrointestinal:  Negative for abdominal pain, constipation, diarrhea, nausea and vomiting.  Genitourinary:  Positive for difficulty urinating. Negative for dysuria and frequency.       Self catheterization   Musculoskeletal:  Positive for arthralgias and back pain. Negative for joint swelling and neck pain.  Skin:  Negative for rash.  Neurological:  Positive for numbness. Negative for tremors.       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 Does not bruise/bleed  easily.  Psychiatric/Behavioral:  Positive for decreased concentration. Negative for behavioral problems (Depression), sleep disturbance and suicidal ideas. The patient is not nervous/anxious.     Vital Signs: BP 122/80   Pulse 94   Temp 98.4 F (36.9 C)   Resp 16   Ht 6' (1.829 m)   Wt 190 lb 3.2 oz (86.3 kg)   SpO2 96%   BMI 25.80 kg/m    Physical Exam Vitals and nursing note reviewed.  Constitutional:      General: He is not in acute distress.    Appearance: He is well-developed. He is not diaphoretic.  HENT:     Head: Normocephalic and atraumatic.     Nose: Nose normal.     Mouth/Throat:     Pharynx: No oropharyngeal exudate.  Eyes:     Pupils: Pupils are equal, round, and reactive to light.  Neck:  Thyroid: No thyromegaly.     Vascular: No JVD.     Trachea: No tracheal deviation.  Cardiovascular:     Rate and Rhythm: Normal rate and regular rhythm.     Heart sounds: Normal heart sounds. No murmur heard.    No friction rub. No gallop.  Pulmonary:     Effort: Pulmonary effort is normal. No respiratory distress.     Breath sounds: Normal breath sounds. No stridor. No wheezing or rales.  Chest:     Chest wall: No tenderness.  Abdominal:     General: Bowel sounds are normal.     Palpations: Abdomen is soft.  Musculoskeletal:        General: No tenderness or deformity. Normal range of motion.     Cervical back: Normal range of motion and neck supple.     Comments: Limited ROM from back injury  Lymphadenopathy:     Cervical: No cervical adenopathy.  Skin:    General: Skin is warm and dry.  Neurological:     Mental Status: He is alert.     Cranial Nerves: No cranial nerve deficit.     Sensory: Sensory deficit present.     Motor: No abnormal muscle tone.     Coordination: Coordination normal.     Deep Tendon Reflexes: Reflexes are normal and symmetric.  Psychiatric:        Behavior: Behavior normal.        Thought Content: Thought content normal.         Judgment: Judgment normal.        Assessment/Plan: 1. Attention and concentration deficit (Primary) May continue adderall as before - amphetamine-dextroamphetamine (ADDERALL) 20 MG tablet; Take 1 tablet (20 mg total) by mouth 2 (two) times daily.  Dispense: 60 tablet; Refill: 0 - amphetamine-dextroamphetamine (ADDERALL) 20 MG tablet; Take 1 tablet (20 mg total) by mouth 2 (two) times daily.  Dispense: 60 tablet; Refill: 0 - amphetamine-dextroamphetamine (ADDERALL) 20 MG tablet; Take 1 tablet (20 mg total) by mouth 2 (two) times daily.  Dispense: 60 tablet; Refill: 0 [place] Controlled Substance Database was reviewed by me for overdose risk score (ORS) Refilled Controlled medications today. Reviewed risks and possible side effects associated with taking Stimulants. Combination of these drugs with other psychotropic medications could cause dizziness and drowsiness. Pt needs to Monitor symptoms and exercise caution in driving and operating heavy machinery to avoid damages to oneself, to others and to the surroundings. Patient verbalized understanding in this matter. Dependence and abuse for these drugs will be monitored closely. A Controlled substance policy and procedure is on file which allows [facility] to order a urine drug screen test at any visit. Patient understands and agrees with the plan..  2. Excessive daytime sleepiness - amphetamine-dextroamphetamine (ADDERALL) 20 MG tablet; Take 1 tablet (20 mg total) by mouth 2 (two) times daily.  Dispense: 60 tablet; Refill: 0 - amphetamine-dextroamphetamine (ADDERALL) 20 MG tablet; Take 1 tablet (20 mg total) by mouth 2 (two) times daily.  Dispense: 60 tablet; Refill: 0 - amphetamine-dextroamphetamine (ADDERALL) 20 MG tablet; Take 1 tablet (20 mg total) by mouth 2 (two) times daily.  Dispense: 60 tablet; Refill: 0  3. Nocturnal hypoxemia May need updated sleep study, will start with ONO - Pulse oximetry, overnight; Future  4. SOB  (shortness of breath) Will check ONO and PFT, may need further work up - Pulmonary Function Test; Future - Pulse oximetry, overnight; Future  5. Urinary tract infection with hematuria, site unspecified - CULTURE,  URINE COMPREHENSIVE  6. Encounter for long-term (current) use of high-risk medication - POCT Urine Drug Screen  7. Dysuria - POCT Urinalysis Dipstick   General Counseling: Gildo verbalizes understanding of the findings of todays visit and agrees with plan of treatment. I have discussed any further diagnostic evaluation that may be needed or ordered today. We also reviewed his medications today. he has been encouraged to call the office with any questions or concerns that should arise related to todays visit.    Orders Placed This Encounter  Procedures   CULTURE, URINE COMPREHENSIVE   Pulse oximetry, overnight   POCT Urinalysis Dipstick   POCT Urine Drug Screen   Pulmonary Function Test    Meds ordered this encounter  Medications   amphetamine-dextroamphetamine (ADDERALL) 20 MG tablet    Sig: Take 1 tablet (20 mg total) by mouth 2 (two) times daily.    Dispense:  60 tablet    Refill:  0    Fill for 09/11/23   amphetamine-dextroamphetamine (ADDERALL) 20 MG tablet    Sig: Take 1 tablet (20 mg total) by mouth 2 (two) times daily.    Dispense:  60 tablet    Refill:  0   amphetamine-dextroamphetamine (ADDERALL) 20 MG tablet    Sig: Take 1 tablet (20 mg total) by mouth 2 (two) times daily.    Dispense:  60 tablet    Refill:  0    Fill for 08/14/23    This patient was seen by Lynn Ito, PA-C in collaboration with Dr. Beverely Risen as a part of collaborative care agreement.   Total time spent:30 Minutes Time spent includes review of chart, medications, test results, and follow up plan with the patient.      Dr Lyndon Code Internal medicine

## 2023-10-31 LAB — CULTURE, URINE COMPREHENSIVE

## 2023-11-04 ENCOUNTER — Telehealth: Payer: Self-pay

## 2023-11-04 NOTE — Telephone Encounter (Signed)
Left message for patient regarding urine results.

## 2023-11-04 NOTE — Telephone Encounter (Signed)
-----   Message from Carlean Jews sent at 11/04/2023  1:31 PM EST ----- Please let him know his repeat urine came back normal

## 2023-11-26 ENCOUNTER — Ambulatory Visit (INDEPENDENT_AMBULATORY_CARE_PROVIDER_SITE_OTHER): Payer: BC Managed Care – PPO | Admitting: Internal Medicine

## 2023-11-26 DIAGNOSIS — R0602 Shortness of breath: Secondary | ICD-10-CM | POA: Diagnosis not present

## 2023-11-27 ENCOUNTER — Telehealth: Payer: Self-pay | Admitting: Physician Assistant

## 2023-11-27 NOTE — Telephone Encounter (Signed)
 Sent community message to [employer] with [facility] for update on overnight pulseox testing-Toni

## 2023-12-01 ENCOUNTER — Telehealth: Payer: Self-pay | Admitting: Physician Assistant

## 2023-12-01 NOTE — Telephone Encounter (Signed)
 Received overnight PulseOx results. Gave to [employer]

## 2023-12-04 ENCOUNTER — Ambulatory Visit (INDEPENDENT_AMBULATORY_CARE_PROVIDER_SITE_OTHER): Payer: BC Managed Care – PPO | Admitting: Physician Assistant

## 2023-12-04 ENCOUNTER — Encounter: Payer: Self-pay | Admitting: Physician Assistant

## 2023-12-04 VITALS — BP 108/72 | HR 93 | Temp 98.5°F | Resp 16 | Ht 72.0 in | Wt 194.0 lb

## 2023-12-04 DIAGNOSIS — R41840 Attention and concentration deficit: Secondary | ICD-10-CM

## 2023-12-04 DIAGNOSIS — R0602 Shortness of breath: Secondary | ICD-10-CM | POA: Diagnosis not present

## 2023-12-04 DIAGNOSIS — E663 Overweight: Secondary | ICD-10-CM | POA: Diagnosis not present
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
## 2024-01-22 ENCOUNTER — Other Ambulatory Visit: Payer: Self-pay | Admitting: Physician Assistant

## 2024-01-22 ENCOUNTER — Telehealth: Payer: Self-pay

## 2024-01-22 DIAGNOSIS — G4719 Other hypersomnia: Secondary | ICD-10-CM

## 2024-01-22 DIAGNOSIS — R41840 Attention and concentration deficit: Secondary | ICD-10-CM

## 2024-01-22 MED ORDER — AMPHETAMINE-DEXTROAMPHETAMINE 20 MG PO TABS: 20.0000 mg | ORAL_TABLET | Freq: Two times a day (BID) | ORAL | 0 refills | Status: DC

## 2024-01-22 NOTE — Telephone Encounter (Signed)
 sent

## 2024-01-22 NOTE — Telephone Encounter (Signed)
 Pt as notified that we sent med

## 2024-01-29 ENCOUNTER — Encounter: Payer: Self-pay | Admitting: Physician Assistant

## 2024-01-29 ENCOUNTER — Ambulatory Visit (INDEPENDENT_AMBULATORY_CARE_PROVIDER_SITE_OTHER): Payer: BC Managed Care – PPO | Admitting: Physician Assistant

## 2024-01-29 VITALS — BP 110/80 | HR 89 | Temp 97.8°F | Resp 16 | Ht 72.0 in | Wt 190.0 lb

## 2024-01-29 DIAGNOSIS — R41840 Attention and concentration deficit: Secondary | ICD-10-CM | POA: Diagnosis not present

## 2024-01-29 DIAGNOSIS — Z1329 Encounter for screening for other suspected endocrine disorder: Secondary | ICD-10-CM

## 2024-01-29 DIAGNOSIS — E782 Mixed hyperlipidemia: Secondary | ICD-10-CM

## 2024-01-29 DIAGNOSIS — G4719 Other hypersomnia: Secondary | ICD-10-CM | POA: Diagnosis not present

## 2024-01-29 DIAGNOSIS — E538 Deficiency of other specified B group vitamins: Secondary | ICD-10-CM

## 2024-01-29 DIAGNOSIS — E559 Vitamin D deficiency, unspecified: Secondary | ICD-10-CM

## 2024-01-29 DIAGNOSIS — M792 Neuralgia and neuritis, unspecified: Secondary | ICD-10-CM

## 2024-01-29 DIAGNOSIS — R5383 Other fatigue: Secondary | ICD-10-CM

## 2024-01-29 MED ORDER — AMPHETAMINE-DEXTROAMPHETAMINE 20 MG PO TABS: 20.0000 mg | ORAL_TABLET | Freq: Two times a day (BID) | ORAL | 0 refills | Status: DC

## 2024-01-29 MED ORDER — AMPHETAMINE-DEXTROAMPHETAMINE 20 MG PO TABS
20.0000 mg | ORAL_TABLET | Freq: Two times a day (BID) | ORAL | 0 refills | Status: AC
Start: 2024-02-21 — End: ?

## 2024-01-29 NOTE — Progress Notes (Signed)
 [facility] [address] [place], [place] [address]  Internal MEDICINE  Office Visit Note  Patient Name: Kevin Whitehead  [id number]  [id number]  Date of Service: 01/29/2024  Chief Complaint  Patient presents with   Follow-up   ADD    HPI Pt is here for routine follow up -Pain management provider is in [place], but has been referred to another more local option. But has not heard from them yet (Dr. Cherylann Ratel). States pain meds very expensive until deductible met now so adjusting long acting med. -Morphine instead of xtamza ER currently due to insurance, then takes oxycodone 10mg  with this. Tries to limit morhpine as he doesn't really like to take this unless necessary -down 4 lbs since last visit -in the morning has some congestion, and has noticed allergies acting up more. Did discuss possible echo and/or chest CT if continuing SOB with exertion. Would like to keep working on wt loss and getting pain meds straightened out and discuss again next visit.  -doing well with his adderall and will need future refills  Current Medication: Outpatient Encounter Medications as of 01/29/2024  Medication Sig Note   acetaminophen (TYLENOL) 325 MG tablet Take 2 tablets (650 mg total) by mouth every 6 (six) hours as needed for mild pain (or Fever >/= 101).    Ascorbic Acid (VITAMIN C) 1000 MG tablet Take 1,000 mg by mouth daily.    bisacodyl (DULCOLAX) 10 MG suppository Place 1 suppository (10 mg total) rectally daily after supper.    buPROPion ER (WELLBUTRIN SR) 100 MG 12 hr tablet TAKE 1 TABLET BY MOUTH ONCE DAILY    cyanocobalamin 1000 MCG tablet Take 1,000 mcg by mouth daily. 5,000 mcg    diazepam (VALIUM) 5 MG tablet TAKE 1-2 TABLETS (5-10 MG TOTAL) BY MOUTH AT BEDTIME AS NEEDED FOR MUSCLE SPASMS.    docusate sodium (COLACE) 100 MG capsule Take 100 mg by mouth daily.    fluticasone (FLONASE) 50 MCG/ACT nasal spray Place 1 spray into both nostrils daily as needed for allergies or  rhinitis.    hydrocortisone (ANUSOL-HC) 25 MG suppository PLACE ONE SUPPOSITORY RECTALLY TWO TIMES DAILY    meloxicam (MOBIC) 15 MG tablet Take by mouth.    Multiple Vitamin (MULTIVITAMIN) capsule Take 1 capsule by mouth daily.    oxyCODONE 10 MG TABS Take 1 tablet (10 mg total) by mouth every 3 (three) hours as needed for moderate pain. 12/06/2020: #50 on 11/28/20 #12 today LD 12/06/20   oxyCODONE ER (XTAMPZA ER) 13.5 MG C12A Take by mouth 2 (two) times daily.    polyethylene glycol (MIRALAX / GLYCOLAX) 17 g packet Take 17 g by mouth daily.    sildenafil (VIAGRA) 100 MG tablet Take 100 mg by mouth as needed.    [DISCONTINUED] amphetamine-dextroamphetamine (ADDERALL) 20 MG tablet Take 1 tablet (20 mg total) by mouth 2 (two) times daily.    [DISCONTINUED] amphetamine-dextroamphetamine (ADDERALL) 20 MG tablet Take 1 tablet (20 mg total) by mouth 2 (two) times daily.    [DISCONTINUED] amphetamine-dextroamphetamine (ADDERALL) 20 MG tablet Take 1 tablet (20 mg total) by mouth 2 (two) times daily.    [DISCONTINUED] sulfamethoxazole-trimethoprim (BACTRIM DS) 800-160 MG tablet Take 1 tablet by mouth 2 (two) times daily.    [START ON 02/21/2024] amphetamine-dextroamphetamine (ADDERALL) 20 MG tablet Take 1 tablet (20 mg total) by mouth 2 (two) times daily.    [START ON 03/21/2024] amphetamine-dextroamphetamine (ADDERALL) 20 MG tablet Take 1 tablet (20 mg total) by mouth 2 (two)  times daily.    [START ON 04/21/2024] amphetamine-dextroamphetamine (ADDERALL) 20 MG tablet Take 1 tablet (20 mg total) by mouth 2 (two) times daily.    No facility-administered encounter medications on file as of 01/29/2024.    Surgical History: Past Surgical History:  Procedure Laterality Date   ANKLE SURGERY  2016   POSTERIOR LUMBAR FUSION 4 LEVEL N/A 10/30/2020   Procedure: POSTERIOR LUMBAR FUSION 4 LEVEL;  Surgeon: Julio Sicks, MD;  Location: [facility] OR;  Service: Neurosurgery;  Laterality: N/A;  Lumbar one decompressive laminectomy with  transpedicular decompression, Thoracic eleven through Lumbar three posterior lateral arthrodesis utilizing pedicle screw fixation and local autografting    Medical History: Past Medical History:  Diagnosis Date   Attention and concentration deficit    Hemorrhoid     Family History: Family History  Problem Relation Age of Onset   COPD Father     Social History   Socioeconomic History   Marital status: Married    Spouse name: Not on file   Number of children: Not on file   Years of education: Not on file   Highest education level: Not on file  Occupational History   Not on file  Tobacco Use   Smoking status: Former    Current packs/day: 0.00    Types: Cigarettes    Quit date: 2017    Years since quitting: 8.2   Smokeless tobacco: Never  Vaping Use   Vaping status: Never Used  Substance and Sexual Activity   Alcohol use: No   Drug use: No   Sexual activity: Not on file  Other Topics Concern   Not on file  Social History Narrative   Not on file   Social Drivers of Health   Financial Resource Strain: Not on file  Food Insecurity: Not on file  Transportation Needs: Not on file  Physical Activity: Not on file  Stress: Not on file  Social Connections: Not on file  Intimate Partner Violence: Not on file      Review of Systems  Constitutional:  Positive for fatigue. Negative for chills and unexpected weight change.  HENT:  Negative for congestion, postnasal drip, rhinorrhea, sneezing and sore throat.   Eyes:  Negative for redness.  Respiratory:  Negative for cough and chest tightness.        SOB with exertion only  Cardiovascular:  Negative for chest pain and palpitations.  Gastrointestinal:  Negative for abdominal pain, constipation, diarrhea, nausea and vomiting.  Genitourinary:  Positive for difficulty urinating. Negative for dysuria and frequency.       Self catheterization   Musculoskeletal:  Positive for arthralgias and back pain. Negative for joint  swelling and neck pain.  Skin:  Negative for rash.  Neurological:  Positive for numbness. Negative for tremors.       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 Does not bruise/bleed easily.  Psychiatric/Behavioral:  Positive for decreased concentration. Negative for behavioral problems (Depression), sleep disturbance and suicidal ideas. The patient is not nervous/anxious.     Vital Signs: BP 110/80   Pulse 89   Temp 97.8 F (36.6 C)   Resp 16   Ht 6' (1.829 m)   Wt 190 lb (86.2 kg)   SpO2 98%   BMI 25.77 kg/m    Physical Exam Vitals and nursing note reviewed.  Constitutional:      General: He is not in acute distress.    Appearance: He is well-developed. He  is not diaphoretic.  HENT:     Head: Normocephalic and atraumatic.     Nose: Nose normal.     Mouth/Throat:     Pharynx: No oropharyngeal exudate.  Eyes:     Pupils: Pupils are equal, round, and reactive to light.  Neck:     Thyroid: No thyromegaly.     Vascular: No JVD.     Trachea: No tracheal deviation.  Cardiovascular:     Rate and Rhythm: Normal rate and regular rhythm.     Heart sounds: Normal heart sounds. No murmur heard.    No friction rub. No gallop.  Pulmonary:     Effort: Pulmonary effort is normal. No respiratory distress.     Breath sounds: Normal breath sounds. No stridor. No wheezing or rales.  Chest:     Chest wall: No tenderness.  Abdominal:     General: Bowel sounds are normal.     Palpations: Abdomen is soft.  Musculoskeletal:        General: No tenderness or deformity. Normal range of motion.     Cervical back: Normal range of motion and neck supple.     Comments: Limited ROM from back injury  Lymphadenopathy:     Cervical: No cervical adenopathy.  Skin:    General: Skin is warm and dry.  Neurological:     Mental Status: He is alert.     Cranial Nerves: No cranial nerve deficit.     Sensory: Sensory  deficit present.     Motor: No abnormal muscle tone.     Coordination: Coordination normal.     Deep Tendon Reflexes: Reflexes are normal and symmetric.  Psychiatric:        Behavior: Behavior normal.        Thought Content: Thought content normal.        Judgment: Judgment normal.        Assessment/Plan: 1. Attention and concentration deficit (Primary) May continue adderall as before - amphetamine-dextroamphetamine (ADDERALL) 20 MG tablet; Take 1 tablet (20 mg total) by mouth 2 (two) times daily.  Dispense: 60 tablet; Refill: 0 - amphetamine-dextroamphetamine (ADDERALL) 20 MG tablet; Take 1 tablet (20 mg total) by mouth 2 (two) times daily.  Dispense: 60 tablet; Refill: 0 - amphetamine-dextroamphetamine (ADDERALL) 20 MG tablet; Take 1 tablet (20 mg total) by mouth 2 (two) times daily.  Dispense: 60 tablet; Refill: 0 [place] Controlled Substance Database was reviewed by me for overdose risk score (ORS) Refilled Controlled medications today. Reviewed risks and possible side effects associated with taking Stimulants. Combination of these drugs with other psychotropic medications could cause dizziness and drowsiness. Pt needs to Monitor symptoms and exercise caution in driving and operating heavy machinery to avoid damages to oneself, to others and to the surroundings. Patient verbalized understanding in this matter. Dependence and abuse for these drugs will be monitored closely. A Controlled substance policy and procedure is on file which allows [facility] to order a urine drug screen test at any visit. Patient understands and agrees with the plan..  2. Excessive daytime sleepiness - amphetamine-dextroamphetamine (ADDERALL) 20 MG tablet; Take 1 tablet (20 mg total) by mouth 2 (two) times daily.  Dispense: 60 tablet; Refill: 0 - amphetamine-dextroamphetamine (ADDERALL) 20 MG tablet; Take 1 tablet (20 mg total) by mouth 2 (two) times daily.  Dispense: 60 tablet; Refill: 0 -  amphetamine-dextroamphetamine (ADDERALL) 20 MG tablet; Take 1 tablet (20 mg total) by mouth 2 (two) times daily.  Dispense: 60 tablet; Refill: 0  3. Neuropathic pain Followed by pain management and may be switching providers soon  4. Vitamin D deficiency - VITAMIN D 25 Hydroxy (Vit-D Deficiency, Fractures)  5. Mixed hyperlipidemia - Lipid Panel With LDL/HDL Ratio  6. B12 deficiency - B12 and Folate Panel  7. Other fatigue - CBC w/Diff/Platelet - Comprehensive metabolic panel - TSH + free T4 - Lipid Panel With LDL/HDL Ratio - VITAMIN D 25 Hydroxy (Vit-D Deficiency, Fractures) - B12 and Folate Panel  8. Thyroid disorder screen - TSH + free T4   General Counseling: Darvis verbalizes understanding of the findings of todays visit and agrees with plan of treatment. I have discussed any further diagnostic evaluation that may be needed or ordered today. We also reviewed his medications today. he has been encouraged to call the office with any questions or concerns that should arise related to todays visit.    Orders Placed This Encounter  Procedures   CBC w/Diff/Platelet   Comprehensive metabolic panel   TSH + free T4   Lipid Panel With LDL/HDL Ratio   VITAMIN D 25 Hydroxy (Vit-D Deficiency, Fractures)   B12 and Folate Panel    Meds ordered this encounter  Medications   amphetamine-dextroamphetamine (ADDERALL) 20 MG tablet    Sig: Take 1 tablet (20 mg total) by mouth 2 (two) times daily.    Dispense:  60 tablet    Refill:  0   amphetamine-dextroamphetamine (ADDERALL) 20 MG tablet    Sig: Take 1 tablet (20 mg total) by mouth 2 (two) times daily.    Dispense:  60 tablet    Refill:  0   amphetamine-dextroamphetamine (ADDERALL) 20 MG tablet    Sig: Take 1 tablet (20 mg total) by mouth 2 (two) times daily.    Dispense:  60 tablet    Refill:  0    Fill for 08/14/23    This patient was seen by Lynn Ito, PA-C in collaboration with Dr. Beverely Risen as a part of  collaborative care agreement.   Total time spent:30 Minutes Time spent includes review of chart, medications, test results, and follow up plan with the patient.      Dr Lyndon Code Internal medicine

## 2024-02-03 ENCOUNTER — Telehealth: Payer: Self-pay | Admitting: Physician Assistant

## 2024-02-03 NOTE — Telephone Encounter (Signed)
 Received overnight pulxox order. Gave to [employer]

## 2024-02-25 DIAGNOSIS — G8929 Other chronic pain: Secondary | ICD-10-CM | POA: Diagnosis not present

## 2024-02-25 DIAGNOSIS — M47816 Spondylosis without myelopathy or radiculopathy, lumbar region: Secondary | ICD-10-CM | POA: Diagnosis not present

## 2024-02-25 DIAGNOSIS — M4325 Fusion of spine, thoracolumbar region: Secondary | ICD-10-CM | POA: Diagnosis not present

## 2024-02-25 DIAGNOSIS — Z79899 Other long term (current) drug therapy: Secondary | ICD-10-CM | POA: Diagnosis not present

## 2024-02-25 DIAGNOSIS — M129 Arthropathy, unspecified: Secondary | ICD-10-CM | POA: Diagnosis not present

## 2024-02-25 DIAGNOSIS — M549 Dorsalgia, unspecified: Secondary | ICD-10-CM | POA: Diagnosis not present

## 2024-02-26 NOTE — Procedures (Signed)
 [facility] [address] [place], [address]    Complete Pulmonary Function Testing Interpretation:  FINDINGS:   Forced vital capacity is normal.  FEV1 is normal.  FEV1 FVC ratio was normal.  Post bronchodilator no significant changes noted in the FEV1.  Total lung capacity was mildly decreased.  Residual volume is decreased.  Residual volume total lung capacity ratio was decreased.  FRC was decreased.  DLCO was mildly decreased.  IMPRESSION:    This pulmonary function study is suggestive of mild restrictive lung disease clinical correlation is recommended  Yevonne Pax, MD [facility] Pulmonary Critical Care Medicine Sleep Medicine

## 2024-03-02 DIAGNOSIS — M549 Dorsalgia, unspecified: Secondary | ICD-10-CM | POA: Diagnosis not present

## 2024-03-02 DIAGNOSIS — Z79899 Other long term (current) drug therapy: Secondary | ICD-10-CM | POA: Diagnosis not present

## 2024-03-02 DIAGNOSIS — M4325 Fusion of spine, thoracolumbar region: Secondary | ICD-10-CM | POA: Diagnosis not present

## 2024-03-02 DIAGNOSIS — M47816 Spondylosis without myelopathy or radiculopathy, lumbar region: Secondary | ICD-10-CM | POA: Diagnosis not present

## 2024-03-02 DIAGNOSIS — G8929 Other chronic pain: Secondary | ICD-10-CM | POA: Diagnosis not present

## 2024-03-02 LAB — PULMONARY FUNCTION TEST

## 2024-03-22 DIAGNOSIS — D519 Vitamin B12 deficiency anemia, unspecified: Secondary | ICD-10-CM | POA: Diagnosis not present

## 2024-03-22 DIAGNOSIS — E559 Vitamin D deficiency, unspecified: Secondary | ICD-10-CM | POA: Diagnosis not present

## 2024-03-22 DIAGNOSIS — E782 Mixed hyperlipidemia: Secondary | ICD-10-CM | POA: Diagnosis not present

## 2024-03-22 DIAGNOSIS — Z1329 Encounter for screening for other suspected endocrine disorder: Secondary | ICD-10-CM | POA: Diagnosis not present

## 2024-03-22 DIAGNOSIS — E538 Deficiency of other specified B group vitamins: Secondary | ICD-10-CM | POA: Diagnosis not present

## 2024-03-22 DIAGNOSIS — R5383 Other fatigue: Secondary | ICD-10-CM | POA: Diagnosis not present

## 2024-03-23 LAB — COMPREHENSIVE METABOLIC PANEL WITH GFR
ALT: 18 IU/L (ref 0–44)
AST: 17 IU/L (ref 0–40)
Albumin: 4.2 g/dL (ref 4.1–5.1)
Alkaline Phosphatase: 96 IU/L (ref 44–121)
BUN/Creatinine Ratio: 16 (ref 9–20)
BUN: 13 mg/dL (ref 6–24)
Bilirubin Total: 0.7 mg/dL (ref 0.0–1.2)
CO2: 27 mmol/L (ref 20–29)
Calcium: 9 mg/dL (ref 8.7–10.2)
Chloride: 102 mmol/L (ref 96–106)
Creatinine, Ser: 0.82 mg/dL (ref 0.76–1.27)
Globulin, Total: 2.5 g/dL (ref 1.5–4.5)
Glucose: 110 mg/dL — ABNORMAL HIGH (ref 70–99)
Potassium: 4.2 mmol/L (ref 3.5–5.2)
Sodium: 140 mmol/L (ref 134–144)
Total Protein: 6.7 g/dL (ref 6.0–8.5)
eGFR: 111 mL/min/{1.73_m2} (ref >59)

## 2024-03-23 LAB — CBC WITH DIFFERENTIAL/PLATELET
Basophils Absolute: 0 10*3/uL (ref 0.0–0.2)
Basos: 1 %
EOS (ABSOLUTE): 0.4 10*3/uL (ref 0.0–0.4)
Eos: 6 %
Hematocrit: 38.6 % (ref 37.5–51.0)
Hemoglobin: 13.2 g/dL (ref 13.0–17.7)
Immature Grans (Abs): 0 10*3/uL (ref 0.0–0.1)
Immature Granulocytes: 0 %
Lymphocytes Absolute: 2 10*3/uL (ref 0.7–3.1)
Lymphs: 35 %
MCH: 30.5 pg (ref 26.6–33.0)
MCHC: 34.2 g/dL (ref 31.5–35.7)
MCV: 89 fL (ref 79–97)
Monocytes Absolute: 0.5 10*3/uL (ref 0.1–0.9)
Monocytes: 9 %
Neutrophils Absolute: 2.7 10*3/uL (ref 1.4–7.0)
Neutrophils: 49 %
Platelets: 277 10*3/uL (ref 150–450)
RBC: 4.33 x10E6/uL (ref 4.14–5.80)
RDW: 12.3 % (ref 11.6–15.4)
WBC: 5.6 10*3/uL (ref 3.4–10.8)

## 2024-03-23 LAB — LIPID PANEL WITH LDL/HDL RATIO
Cholesterol, Total: 168 mg/dL (ref 100–199)
HDL: 40 mg/dL (ref >39)
LDL Chol Calc (NIH): 113 mg/dL — ABNORMAL HIGH (ref 0–99)
LDL/HDL Ratio: 2.8 ratio (ref 0.0–3.6)
Triglycerides: 77 mg/dL (ref 0–149)
VLDL Cholesterol Cal: 15 mg/dL (ref 5–40)

## 2024-03-23 LAB — TSH+FREE T4
Free T4: 1.13 ng/dL (ref 0.82–1.77)
TSH: 2.08 u[IU]/mL (ref 0.450–4.500)

## 2024-03-23 LAB — B12 AND FOLATE PANEL
Folate: 10.2 ng/mL (ref >3.0)
Vitamin B-12: 922 pg/mL (ref 232–1245)

## 2024-03-23 LAB — VITAMIN D 25 HYDROXY (VIT D DEFICIENCY, FRACTURES): Vit D, 25-Hydroxy: 26 ng/mL — ABNORMAL LOW (ref 30.0–100.0)

## 2024-03-24 ENCOUNTER — Telehealth: Payer: Self-pay

## 2024-03-24 NOTE — Telephone Encounter (Signed)
-----   Message from Jacques Mattock sent at 03/24/2024 10:55 AM EDT ----- Please let him know that his Vit D is low and advise to supplement. Cholesterol slightly elevated and encourage to work on diet/exercise as able. His sugar was a little elevated again and we will monitor

## 2024-03-24 NOTE — Telephone Encounter (Signed)
Lmom and sent MyChart message

## 2024-04-01 DIAGNOSIS — Z79899 Other long term (current) drug therapy: Secondary | ICD-10-CM | POA: Diagnosis not present

## 2024-04-01 DIAGNOSIS — M47816 Spondylosis without myelopathy or radiculopathy, lumbar region: Secondary | ICD-10-CM | POA: Diagnosis not present

## 2024-04-01 DIAGNOSIS — Z6826 Body mass index (BMI) 26.0-26.9, adult: Secondary | ICD-10-CM | POA: Diagnosis not present

## 2024-04-07 DIAGNOSIS — Z79899 Other long term (current) drug therapy: Secondary | ICD-10-CM | POA: Diagnosis not present

## 2024-04-26 ENCOUNTER — Telehealth: Payer: Self-pay | Admitting: Physician Assistant

## 2024-04-26 NOTE — Telephone Encounter (Signed)
 Left vm and sent mychart message to confirm 05/03/24 appointment-Toni

## 2024-04-30 DIAGNOSIS — Z79899 Other long term (current) drug therapy: Secondary | ICD-10-CM | POA: Diagnosis not present

## 2024-04-30 DIAGNOSIS — M47816 Spondylosis without myelopathy or radiculopathy, lumbar region: Secondary | ICD-10-CM | POA: Diagnosis not present

## 2024-04-30 DIAGNOSIS — Z6826 Body mass index (BMI) 26.0-26.9, adult: Secondary | ICD-10-CM | POA: Diagnosis not present

## 2024-05-03 ENCOUNTER — Encounter: Payer: Self-pay | Admitting: Physician Assistant

## 2024-05-03 ENCOUNTER — Ambulatory Visit (INDEPENDENT_AMBULATORY_CARE_PROVIDER_SITE_OTHER): Payer: BC Managed Care – PPO | Admitting: Physician Assistant

## 2024-05-03 VITALS — BP 122/72 | HR 97 | Temp 97.8°F | Resp 16 | Ht 72.0 in | Wt 193.0 lb

## 2024-05-03 DIAGNOSIS — M792 Neuralgia and neuritis, unspecified: Secondary | ICD-10-CM

## 2024-05-03 DIAGNOSIS — G4719 Other hypersomnia: Secondary | ICD-10-CM

## 2024-05-03 DIAGNOSIS — E559 Vitamin D deficiency, unspecified: Secondary | ICD-10-CM

## 2024-05-03 DIAGNOSIS — Z0001 Encounter for general adult medical examination with abnormal findings: Secondary | ICD-10-CM

## 2024-05-03 DIAGNOSIS — R41840 Attention and concentration deficit: Secondary | ICD-10-CM | POA: Diagnosis not present

## 2024-05-03 DIAGNOSIS — Z79899 Other long term (current) drug therapy: Secondary | ICD-10-CM

## 2024-05-03 LAB — POCT URINE DRUG SCREEN
POC Amphetamine UR: POSITIVE — AB
POC BENZODIAZEPINES UR: NOT DETECTED
POC Barbiturate UR: NOT DETECTED
POC Cocaine UR: NOT DETECTED
POC Ecstasy UR: NOT DETECTED
POC Marijuana UR: NOT DETECTED
POC Methadone UR: NOT DETECTED
POC Methamphetamine UR: NOT DETECTED
POC Opiate Ur: NOT DETECTED
POC Oxycodone UR: POSITIVE — AB
POC PHENCYCLIDINE UR: NOT DETECTED
POC TRICYCLICS UR: NOT DETECTED

## 2024-05-03 MED ORDER — AMPHETAMINE-DEXTROAMPHETAMINE 20 MG PO TABS: 20.0000 mg | ORAL_TABLET | Freq: Two times a day (BID) | ORAL | 0 refills | Status: DC

## 2024-05-03 MED ORDER — AMPHETAMINE-DEXTROAMPHETAMINE 20 MG PO TABS
20.0000 mg | ORAL_TABLET | Freq: Two times a day (BID) | ORAL | 0 refills | Status: DC
Start: 2024-05-19 — End: 2024-07-29

## 2024-05-03 NOTE — Progress Notes (Signed)
 [facility] [address] [place], [place] [address]  Internal MEDICINE  Office Visit Note  Patient Name: Kevin Whitehead  [id number]  [id number]  Date of Service: 04/30/2024  Chief Complaint  Patient presents with   Annual Exam     HPI Pt is here for routine health maintenance examination -Labs reviewed: Vit D low and will start supplement, LDL a little elevated and will work on diet, sugar a little elevated again and A1c checked previously when this has been seen and was normal. He admits to eating ice cream before bed the night before labs -switched to [facility], changed meds, now taking 300mg  gabapentin  at night and then has oxycodone  10mg  QID. Does have to go monthly for refills -doing well with adderall, no side effects and will need future refills -breathing about the same, still deciding if he would like to pursue echo and/or CT chest as no worsening and only with exertion, attributes to weight increase in the past year--working on this  Current Medication: Outpatient Encounter Medications as of 05/03/2024  Medication Sig Note   acetaminophen  (TYLENOL ) 325 MG tablet Take 2 tablets (650 mg total) by mouth every 6 (six) hours as needed for mild pain (or Fever >/= 101).    Ascorbic Acid (VITAMIN C) 1000 MG tablet Take 1,000 mg by mouth daily.    bisacodyl  (DULCOLAX) 10 MG suppository Place 1 suppository (10 mg total) rectally daily after supper.    buPROPion  ER (WELLBUTRIN  SR) 100 MG 12 hr tablet TAKE 1 TABLET BY MOUTH ONCE DAILY    cyanocobalamin  1000 MCG tablet Take 1,000 mcg by mouth daily. 5,000 mcg    diazepam  (VALIUM ) 5 MG tablet TAKE 1-2 TABLETS (5-10 MG TOTAL) BY MOUTH AT BEDTIME AS NEEDED FOR MUSCLE SPASMS.    docusate sodium  (COLACE) 100 MG capsule Take 100 mg by mouth daily.    fluticasone (FLONASE) 50 MCG/ACT nasal spray Place 1 spray into both nostrils daily as needed for allergies or rhinitis.    hydrocortisone  (ANUSOL -HC) 25  MG suppository PLACE ONE SUPPOSITORY RECTALLY TWO TIMES DAILY    meloxicam  (MOBIC ) 15 MG tablet Take by mouth.    Multiple Vitamin (MULTIVITAMIN) capsule Take 1 capsule by mouth daily.    oxyCODONE  10 MG TABS Take 1 tablet (10 mg total) by mouth every 3 (three) hours as needed for moderate pain. 12/06/2020: #50 on 11/28/20 #12 today LD 12/06/20   polyethylene glycol (MIRALAX  / GLYCOLAX ) 17 g packet Take 17 g by mouth daily.    sildenafil (VIAGRA) 100 MG tablet Take 100 mg by mouth as needed.    [DISCONTINUED] amphetamine -dextroamphetamine  (ADDERALL) 20 MG tablet Take 1 tablet (20 mg total) by mouth 2 (two) times daily.    [DISCONTINUED] amphetamine -dextroamphetamine  (ADDERALL) 20 MG tablet Take 1 tablet (20 mg total) by mouth 2 (two) times daily.    [DISCONTINUED] amphetamine -dextroamphetamine  (ADDERALL) 20 MG tablet Take 1 tablet (20 mg total) by mouth 2 (two) times daily.    [DISCONTINUED] oxyCODONE  ER (XTAMPZA  ER) 13.5 MG C12A Take by mouth 2 (two) times daily.    amphetamine -dextroamphetamine  (ADDERALL) 20 MG tablet Take 1 tablet (20 mg total) by mouth 2 (two) times daily.    [START ON 06/18/2024] amphetamine -dextroamphetamine  (ADDERALL) 20 MG tablet Take 1 tablet (20 mg total) by mouth 2 (two) times daily.    [START ON 07/17/2024] amphetamine -dextroamphetamine  (ADDERALL) 20 MG tablet Take 1 tablet (20 mg total) by mouth 2 (two) times daily.    No facility-administered  encounter medications on file as of 05/03/2024.    Surgical History: Past Surgical History:  Procedure Laterality Date   ANKLE SURGERY  2016   POSTERIOR LUMBAR FUSION 4 LEVEL N/A 10/30/2020   Procedure: POSTERIOR LUMBAR FUSION 4 LEVEL;  Surgeon: Louis Shove, MD;  Location: [facility] OR;  Service: Neurosurgery;  Laterality: N/A;  Lumbar one decompressive laminectomy with transpedicular decompression, Thoracic eleven through Lumbar three posterior lateral arthrodesis utilizing pedicle screw fixation and local autografting    Medical  History: Past Medical History:  Diagnosis Date   Attention and concentration deficit    Hemorrhoid     Family History: Family History  Problem Relation Age of Onset   COPD Father       Review of Systems  Constitutional:  Positive for fatigue. Negative for chills and unexpected weight change.  HENT:  Negative for congestion, postnasal drip, rhinorrhea, sneezing and sore throat.   Eyes:  Negative for redness.  Respiratory:  Negative for cough and chest tightness.        SOB with exertion only  Cardiovascular:  Negative for chest pain and palpitations.  Gastrointestinal:  Negative for abdominal pain, constipation, diarrhea, nausea and vomiting.  Genitourinary:  Positive for difficulty urinating. Negative for dysuria and frequency.       Self catheterization   Musculoskeletal:  Positive for arthralgias and back pain. Negative for joint swelling and neck pain.  Skin:  Negative for rash.  Neurological:  Positive for numbness. Negative for tremors.       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 Does not bruise/bleed easily.  Psychiatric/Behavioral:  Positive for decreased concentration. Negative for behavioral problems (Depression), sleep disturbance and suicidal ideas. The patient is not nervous/anxious.      Vital Signs: BP 122/72   Pulse 97   Temp 97.8 F (36.6 C)   Resp 16   Ht 6' (1.829 m)   Wt 193 lb (87.5 kg)   SpO2 98%   BMI 26.18 kg/m    Physical Exam Vitals and nursing note reviewed.  Constitutional:      General: He is not in acute distress.    Appearance: He is well-developed. He is not diaphoretic.  HENT:     Head: Normocephalic and atraumatic.     Nose: Nose normal.     Mouth/Throat:     Pharynx: No oropharyngeal exudate.  Eyes:     Pupils: Pupils are equal, round, and reactive to light.  Neck:     Thyroid : No thyromegaly.     Vascular: No JVD.     Trachea: No tracheal  deviation.  Cardiovascular:     Rate and Rhythm: Normal rate and regular rhythm.     Heart sounds: Normal heart sounds. No murmur heard.    No friction rub. No gallop.  Pulmonary:     Effort: Pulmonary effort is normal. No respiratory distress.     Breath sounds: Normal breath sounds. No stridor. No wheezing or rales.  Chest:     Chest wall: No tenderness.  Abdominal:     General: Bowel sounds are normal.     Palpations: Abdomen is soft.     Tenderness: There is no abdominal tenderness.  Musculoskeletal:        General: No tenderness or deformity. Normal range of motion.     Cervical back: Normal range of motion and neck supple.     Comments: Limited ROM from back injury  Lymphadenopathy:  Cervical: No cervical adenopathy.  Skin:    General: Skin is warm and dry.  Neurological:     Mental Status: He is alert.     Cranial Nerves: No cranial nerve deficit.     Sensory: Sensory deficit present.     Motor: No abnormal muscle tone.     Coordination: Coordination normal.     Deep Tendon Reflexes: Reflexes are normal and symmetric.  Psychiatric:        Behavior: Behavior normal.        Thought Content: Thought content normal.        Judgment: Judgment normal.      LABS: Recent Results (from the past 2160 hours)  Pulmonary Function Test     Status: None   Collection Time: 03/02/24  8:27 AM  Result Value Ref Range   FEV1     FVC     FEV1/FVC     TLC     DLCO    CBC w/Diff/Platelet     Status: None   Collection Time: 03/22/24  9:46 AM  Result Value Ref Range   WBC 5.6 3.4 - 10.8 x10E3/uL   RBC 4.33 4.14 - 5.80 x10E6/uL   Hemoglobin 13.2 13.0 - 17.7 g/dL   Hematocrit 61.3 62.4 - 51.0 %   MCV 89 79 - 97 fL   MCH 30.5 26.6 - 33.0 pg   MCHC 34.2 31.5 - 35.7 g/dL   RDW 87.6 88.3 - 84.5 %   Platelets 277 150 - 450 x10E3/uL   Neutrophils 49 Not Estab. %   Lymphs 35 Not Estab. %   Monocytes 9 Not Estab. %   Eos 6 Not Estab. %   Basos 1 Not Estab. %   Neutrophils  Absolute 2.7 1.4 - 7.0 x10E3/uL   Lymphocytes Absolute 2.0 0.7 - 3.1 x10E3/uL   Monocytes Absolute 0.5 0.1 - 0.9 x10E3/uL   EOS (ABSOLUTE) 0.4 0.0 - 0.4 x10E3/uL   Basophils Absolute 0.0 0.0 - 0.2 x10E3/uL   Immature Granulocytes 0 Not Estab. %   Immature Grans (Abs) 0.0 0.0 - 0.1 x10E3/uL  Comprehensive metabolic panel     Status: Abnormal   Collection Time: 03/22/24  9:46 AM  Result Value Ref Range   Glucose 110 (H) 70 - 99 mg/dL   BUN 13 6 - 24 mg/dL   Creatinine, Ser 9.17 0.76 - 1.27 mg/dL   eGFR 888 >40 fO/fpw/8.26   BUN/Creatinine Ratio 16 9 - 20   Sodium 140 134 - 144 mmol/L   Potassium 4.2 3.5 - 5.2 mmol/L   Chloride 102 96 - 106 mmol/L   CO2 27 20 - 29 mmol/L   Calcium 9.0 8.7 - 10.2 mg/dL   Total Protein 6.7 6.0 - 8.5 g/dL   Albumin 4.2 4.1 - 5.1 g/dL   Globulin, Total 2.5 1.5 - 4.5 g/dL   Bilirubin Total 0.7 0.0 - 1.2 mg/dL   Alkaline Phosphatase 96 44 - 121 IU/L   AST 17 0 - 40 IU/L   ALT 18 0 - 44 IU/L  TSH + free T4     Status: None   Collection Time: 03/22/24  9:46 AM  Result Value Ref Range   TSH 2.080 0.450 - 4.500 uIU/mL   Free T4 1.13 0.82 - 1.77 ng/dL  Lipid Panel With LDL/HDL Ratio     Status: Abnormal   Collection Time: 03/22/24  9:46 AM  Result Value Ref Range   Cholesterol, Total 168 100 - 199 mg/dL  Triglycerides 77 0 - 149 mg/dL   HDL 40 >60 mg/dL   VLDL Cholesterol Cal 15 5 - 40 mg/dL   LDL Chol Calc (NIH) 886 (H) 0 - 99 mg/dL   LDL/HDL Ratio 2.8 0.0 - 3.6 ratio    Comment:                                     LDL/HDL Ratio                                             Men  Women                               1/2 Avg.Risk  1.0    1.5                                   Avg.Risk  3.6    3.2                                2X Avg.Risk  6.2    5.0                                3X Avg.Risk  8.0    6.1   VITAMIN D  25 Hydroxy (Vit-D Deficiency, Fractures)     Status: Abnormal   Collection Time: 03/22/24  9:46 AM  Result Value Ref Range   Vit D,  25-Hydroxy 26.0 (L) 30.0 - 100.0 ng/mL    Comment: Vitamin D  deficiency has been defined by the Institute of Medicine and an Endocrine Society practice guideline as a level of serum 25-OH vitamin D  less than 20 ng/mL (1,2). The Endocrine Society went on to further define vitamin D  insufficiency as a level between 21 and 29 ng/mL (2). 1. IOM (Institute of Medicine). 2010. Dietary reference    intakes for calcium and D. [place]: The    [employer]. 2. Holick MF, Binkley [place], Bischoff-Ferrari HA, et al.    Evaluation, treatment, and prevention of vitamin D     deficiency: an Endocrine Society clinical practice    guideline. JCEM. 2011 Jul; 96(7):1911-30.   B12 and Folate Panel     Status: None   Collection Time: 03/22/24  9:46 AM  Result Value Ref Range   Vitamin B-12 922 232 - 1,245 pg/mL   Folate 10.2 >3.0 ng/mL    Comment: A serum folate concentration of less than 3.1 ng/mL is considered to represent clinical deficiency.   POCT Urine Drug Screen     Status: Abnormal   Collection Time: 05/03/24  8:53 AM  Result Value Ref Range   POC Methamphetamine UR None Detected None Detected   POC Opiate Ur None Detected None Detected   POC Barbiturate UR None Detected None Detected   POC Amphetamine  UR Positive (A) None Detected   POC Oxycodone  UR Positive (A) None Detected   POC Cocaine UR None Detected None Detected   POC Ecstasy UR None Detected None Detected   POC TRICYCLICS UR None Detected None Detected  POC PHENCYCLIDINE UR None Detected None Detected   POC Marijuana UR None Detected None Detected   POC Methadone UR None Detected None Detected   POC BENZODIAZEPINES UR None Detected None Detected   URINE TEMPERATURE     POC DRUG SCREEN OXIDANTS URINE     POC SPECIFIC GRAVITY URINE     POC PH URINE     Methylenedioxyamphetamine          Assessment/Plan: 1. Encounter for general adult medical examination with abnormal findings (Primary) CPE performed, labs  reviewed  2. Attention and concentration deficit May continue adderall as before - amphetamine -dextroamphetamine  (ADDERALL) 20 MG tablet; Take 1 tablet (20 mg total) by mouth 2 (two) times daily.  Dispense: 60 tablet; Refill: 0 - amphetamine -dextroamphetamine  (ADDERALL) 20 MG tablet; Take 1 tablet (20 mg total) by mouth 2 (two) times daily.  Dispense: 60 tablet; Refill: 0 - amphetamine -dextroamphetamine  (ADDERALL) 20 MG tablet; Take 1 tablet (20 mg total) by mouth 2 (two) times daily.  Dispense: 60 tablet; Refill: 0 [place] Controlled Substance Database was reviewed by me for overdose risk score (ORS) Refilled Controlled medications today. Reviewed risks and possible side effects associated with taking Stimulants. Combination of these drugs with other psychotropic medications could cause dizziness and drowsiness. Pt needs to Monitor symptoms and exercise caution in driving and operating heavy machinery to avoid damages to oneself, to others and to the surroundings. Patient verbalized understanding in this matter. Dependence and abuse for these drugs will be monitored closely. A Controlled substance policy and procedure is on file which allows [facility] to order a urine drug screen test at any visit. Patient understands and agrees with the plan..  3. Excessive daytime sleepiness - amphetamine -dextroamphetamine  (ADDERALL) 20 MG tablet; Take 1 tablet (20 mg total) by mouth 2 (two) times daily.  Dispense: 60 tablet; Refill: 0 - amphetamine -dextroamphetamine  (ADDERALL) 20 MG tablet; Take 1 tablet (20 mg total) by mouth 2 (two) times daily.  Dispense: 60 tablet; Refill: 0 - amphetamine -dextroamphetamine  (ADDERALL) 20 MG tablet; Take 1 tablet (20 mg total) by mouth 2 (two) times daily.  Dispense: 60 tablet; Refill: 0  4. Neuropathic pain Followed by pain management  5. Vitamin D  deficiency Will start supplement  6. Encounter for long-term (current) use of medications - POCT Urine Drug  Screen   General Counseling: Nyjah verbalizes understanding of the findings of todays visit and agrees with plan of treatment. I have discussed any further diagnostic evaluation that may be needed or ordered today. We also reviewed his medications today. he has been encouraged to call the office with any questions or concerns that should arise related to todays visit.    Counseling:    Orders Placed This Encounter  Procedures   POCT Urine Drug Screen    Meds ordered this encounter  Medications   amphetamine -dextroamphetamine  (ADDERALL) 20 MG tablet    Sig: Take 1 tablet (20 mg total) by mouth 2 (two) times daily.    Dispense:  60 tablet    Refill:  0   amphetamine -dextroamphetamine  (ADDERALL) 20 MG tablet    Sig: Take 1 tablet (20 mg total) by mouth 2 (two) times daily.    Dispense:  60 tablet    Refill:  0   amphetamine -dextroamphetamine  (ADDERALL) 20 MG tablet    Sig: Take 1 tablet (20 mg total) by mouth 2 (two) times daily.    Dispense:  60 tablet    Refill:  0    This patient was seen by Tinnie  Sathvik Tiedt, PA-C in collaboration with Dr. Sigrid Bathe as a part of collaborative care agreement.  Total time spent:35 Minutes  Time spent includes review of chart, medications, test results, and follow up plan with the patient.     Sigrid CHRISTELLA Bathe, MD  Internal Medicine

## 2024-05-28 DIAGNOSIS — M47816 Spondylosis without myelopathy or radiculopathy, lumbar region: Secondary | ICD-10-CM | POA: Diagnosis not present

## 2024-05-28 DIAGNOSIS — Z6826 Body mass index (BMI) 26.0-26.9, adult: Secondary | ICD-10-CM | POA: Diagnosis not present

## 2024-05-28 DIAGNOSIS — Z79899 Other long term (current) drug therapy: Secondary | ICD-10-CM | POA: Diagnosis not present

## 2024-06-02 DIAGNOSIS — Z79899 Other long term (current) drug therapy: Secondary | ICD-10-CM | POA: Diagnosis not present

## 2024-06-21 ENCOUNTER — Encounter: Payer: Self-pay | Admitting: Physician Assistant

## 2024-06-22 ENCOUNTER — Other Ambulatory Visit: Payer: Self-pay | Admitting: Physician Assistant

## 2024-06-22 DIAGNOSIS — R41840 Attention and concentration deficit: Secondary | ICD-10-CM

## 2024-06-22 DIAGNOSIS — G4719 Other hypersomnia: Secondary | ICD-10-CM

## 2024-06-22 MED ORDER — AMPHETAMINE-DEXTROAMPHETAMINE 20 MG PO TABS: 20.0000 mg | ORAL_TABLET | Freq: Two times a day (BID) | ORAL | 0 refills | Status: DC

## 2024-06-28 DIAGNOSIS — Z79899 Other long term (current) drug therapy: Secondary | ICD-10-CM | POA: Diagnosis not present

## 2024-06-28 DIAGNOSIS — M47816 Spondylosis without myelopathy or radiculopathy, lumbar region: Secondary | ICD-10-CM | POA: Diagnosis not present

## 2024-06-28 DIAGNOSIS — Z6826 Body mass index (BMI) 26.0-26.9, adult: Secondary | ICD-10-CM | POA: Diagnosis not present

## 2024-07-28 DIAGNOSIS — M47816 Spondylosis without myelopathy or radiculopathy, lumbar region: Secondary | ICD-10-CM | POA: Diagnosis not present

## 2024-07-28 DIAGNOSIS — Z6826 Body mass index (BMI) 26.0-26.9, adult: Secondary | ICD-10-CM | POA: Diagnosis not present

## 2024-07-28 DIAGNOSIS — Z79899 Other long term (current) drug therapy: Secondary | ICD-10-CM | POA: Diagnosis not present

## 2024-07-29 ENCOUNTER — Encounter: Payer: Self-pay | Admitting: Physician Assistant

## 2024-07-29 ENCOUNTER — Ambulatory Visit: Admitting: Physician Assistant

## 2024-07-29 VITALS — BP 111/74 | HR 76 | Temp 97.8°F | Resp 16 | Ht 72.0 in | Wt 192.0 lb

## 2024-07-29 DIAGNOSIS — R41840 Attention and concentration deficit: Secondary | ICD-10-CM

## 2024-07-29 DIAGNOSIS — M792 Neuralgia and neuritis, unspecified: Secondary | ICD-10-CM

## 2024-07-29 DIAGNOSIS — G4719 Other hypersomnia: Secondary | ICD-10-CM | POA: Diagnosis not present

## 2024-07-29 MED ORDER — AMPHETAMINE-DEXTROAMPHETAMINE 20 MG PO TABS: 20.0000 mg | ORAL_TABLET | Freq: Two times a day (BID) | ORAL | 0 refills | Status: DC

## 2024-07-29 NOTE — Progress Notes (Signed)
 [facility] [address] [place], [place] [address]  Internal MEDICINE  Office Visit Note  Patient Name: Kevin Whitehead  [id number]  [id number]  Date of Service: 07/29/2024  Chief Complaint  Patient presents with   Follow-up    Adderall refill    HPI Pt is here for routine follow up -doing well with adderall, no S/E -sleeping well -continues to see pain management, no changes to his medications -still a little SOB on exertion, but thinks a little better since less hot. PFT done earlier this year and he did have a CT chest 1 year ago after an accident, but this was prior to his symptoms -We have discussed an updated CT chest vs an echo for further work up but he prefers to hold off on further testing for now  Current Medication: Outpatient Encounter Medications as of 07/29/2024  Medication Sig Note   acetaminophen  (TYLENOL ) 325 MG tablet Take 2 tablets (650 mg total) by mouth every 6 (six) hours as needed for mild pain (or Fever >/= 101).    Ascorbic Acid (VITAMIN C) 1000 MG tablet Take 1,000 mg by mouth daily.    bisacodyl  (DULCOLAX) 10 MG suppository Place 1 suppository (10 mg total) rectally daily after supper.    cyanocobalamin  1000 MCG tablet Take 1,000 mcg by mouth daily. 5,000 mcg    diazepam  (VALIUM ) 5 MG tablet TAKE 1-2 TABLETS (5-10 MG TOTAL) BY MOUTH AT BEDTIME AS NEEDED FOR MUSCLE SPASMS.    docusate sodium  (COLACE) 100 MG capsule Take 100 mg by mouth daily.    fluticasone (FLONASE) 50 MCG/ACT nasal spray Place 1 spray into both nostrils daily as needed for allergies or rhinitis.    hydrocortisone  (ANUSOL -HC) 25 MG suppository PLACE ONE SUPPOSITORY RECTALLY TWO TIMES DAILY    meloxicam  (MOBIC ) 15 MG tablet Take by mouth.    Multiple Vitamin (MULTIVITAMIN) capsule Take 1 capsule by mouth daily.    oxyCODONE  10 MG TABS Take 1 tablet (10 mg total) by mouth every 3 (three) hours as needed for moderate pain. 12/06/2020: #50 on 11/28/20 #12 today LD 12/06/20    polyethylene glycol (MIRALAX  / GLYCOLAX ) 17 g packet Take 17 g by mouth daily.    sildenafil (VIAGRA) 100 MG tablet Take 100 mg by mouth as needed.    [DISCONTINUED] amphetamine -dextroamphetamine  (ADDERALL) 20 MG tablet Take 1 tablet (20 mg total) by mouth 2 (two) times daily.    [DISCONTINUED] amphetamine -dextroamphetamine  (ADDERALL) 20 MG tablet Take 1 tablet (20 mg total) by mouth 2 (two) times daily.    [DISCONTINUED] amphetamine -dextroamphetamine  (ADDERALL) 20 MG tablet Take 1 tablet (20 mg total) by mouth 2 (two) times daily.    [DISCONTINUED] buPROPion  ER (WELLBUTRIN  SR) 100 MG 12 hr tablet TAKE 1 TABLET BY MOUTH ONCE DAILY    [START ON 08/16/2024] amphetamine -dextroamphetamine  (ADDERALL) 20 MG tablet Take 1 tablet (20 mg total) by mouth 2 (two) times daily.    [START ON 09/14/2024] amphetamine -dextroamphetamine  (ADDERALL) 20 MG tablet Take 1 tablet (20 mg total) by mouth 2 (two) times daily.    [START ON 10/14/2024] amphetamine -dextroamphetamine  (ADDERALL) 20 MG tablet Take 1 tablet (20 mg total) by mouth 2 (two) times daily.    No facility-administered encounter medications on file as of 07/29/2024.    Surgical History: Past Surgical History:  Procedure Laterality Date   ANKLE SURGERY  2016   POSTERIOR LUMBAR FUSION 4 LEVEL N/A 10/30/2020   Procedure: POSTERIOR LUMBAR FUSION 4 LEVEL;  Surgeon: Louis Shove, MD;  Location:  [facility] OR;  Service: Neurosurgery;  Laterality: N/A;  Lumbar one decompressive laminectomy with transpedicular decompression, Thoracic eleven through Lumbar three posterior lateral arthrodesis utilizing pedicle screw fixation and local autografting    Medical History: Past Medical History:  Diagnosis Date   Attention and concentration deficit    Hemorrhoid     Family History: Family History  Problem Relation Age of Onset   COPD Father     Social History   Socioeconomic History   Marital status: Married    Spouse name: Not on file   Number of children: Not on  file   Years of education: Not on file   Highest education level: Not on file  Occupational History   Not on file  Tobacco Use   Smoking status: Former    Current packs/day: 0.00    Types: Cigarettes    Quit date: 2017    Years since quitting: 8.6   Smokeless tobacco: Never  Vaping Use   Vaping status: Never Used  Substance and Sexual Activity   Alcohol use: No   Drug use: No   Sexual activity: Not on file  Other Topics Concern   Not on file  Social History Narrative   Not on file   Social Drivers of Health   Financial Resource Strain: Not on file  Food Insecurity: Not on file  Transportation Needs: Not on file  Physical Activity: Not on file  Stress: Not on file  Social Connections: Not on file  Intimate Partner Violence: Not on file      Review of Systems  Constitutional:  Positive for fatigue. Negative for chills and unexpected weight change.  HENT:  Negative for congestion, postnasal drip, rhinorrhea, sneezing and sore throat.   Eyes:  Negative for redness.  Respiratory:  Negative for cough and chest tightness.        SOB with exertion only  Cardiovascular:  Negative for chest pain and palpitations.  Gastrointestinal:  Negative for abdominal pain, constipation, diarrhea, nausea and vomiting.  Genitourinary:  Positive for difficulty urinating. Negative for dysuria and frequency.       Self catheterization   Musculoskeletal:  Positive for arthralgias and back pain. Negative for joint swelling and neck pain.  Skin:  Negative for rash.  Neurological:  Positive for numbness. Negative for tremors.       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 Does not bruise/bleed easily.  Psychiatric/Behavioral:  Positive for decreased concentration. Negative for behavioral problems (Depression), sleep disturbance and suicidal ideas. The patient is not nervous/anxious.     Vital Signs: BP 111/74    Pulse 76   Temp 97.8 F (36.6 C)   Resp 16   Ht 6' (1.829 m)   Wt 192 lb (87.1 kg)   SpO2 95%   BMI 26.04 kg/m    Physical Exam Vitals and nursing note reviewed.  Constitutional:      General: He is not in acute distress.    Appearance: He is well-developed. He is not diaphoretic.  HENT:     Head: Normocephalic and atraumatic.  Eyes:     Extraocular Movements: Extraocular movements intact.  Neck:     Thyroid : No thyromegaly.     Vascular: No JVD.     Trachea: No tracheal deviation.  Cardiovascular:     Rate and Rhythm: Normal rate and regular rhythm.     Heart sounds: Normal heart sounds. No murmur heard.    No friction  rub. No gallop.  Pulmonary:     Effort: Pulmonary effort is normal. No respiratory distress.     Breath sounds: Normal breath sounds. No stridor. No wheezing or rales.  Chest:     Chest wall: No tenderness.  Abdominal:     General: Bowel sounds are normal.     Palpations: Abdomen is soft.  Musculoskeletal:        General: No tenderness or deformity. Normal range of motion.     Comments: Limited ROM from back injury  Skin:    General: Skin is warm and dry.  Neurological:     Mental Status: He is alert.     Cranial Nerves: No cranial nerve deficit.     Sensory: Sensory deficit present.     Motor: No abnormal muscle tone.     Coordination: Coordination normal.     Deep Tendon Reflexes: Reflexes are normal and symmetric.  Psychiatric:        Behavior: Behavior normal.        Thought Content: Thought content normal.        Judgment: Judgment normal.        Assessment/Plan: 1. Attention and concentration deficit May continue adderall as before, refills sent - amphetamine -dextroamphetamine  (ADDERALL) 20 MG tablet; Take 1 tablet (20 mg total) by mouth 2 (two) times daily.  Dispense: 60 tablet; Refill: 0 - amphetamine -dextroamphetamine  (ADDERALL) 20 MG tablet; Take 1 tablet (20 mg total) by mouth 2 (two) times daily.  Dispense: 60 tablet; Refill:  0 - amphetamine -dextroamphetamine  (ADDERALL) 20 MG tablet; Take 1 tablet (20 mg total) by mouth 2 (two) times daily.  Dispense: 60 tablet; Refill: 0 [place] Controlled Substance Database was reviewed by me for overdose risk score (ORS) Refilled Controlled medications today. Reviewed risks and possible side effects associated with taking Stimulants. Combination of these drugs with other psychotropic medications could cause dizziness and drowsiness. Pt needs to Monitor symptoms and exercise caution in driving and operating heavy machinery to avoid damages to oneself, to others and to the surroundings. Patient verbalized understanding in this matter. Dependence and abuse for these drugs will be monitored closely. A Controlled substance policy and procedure is on file which allows [facility] to order a urine drug screen test at any visit. Patient understands and agrees with the plan..  2. Excessive daytime sleepiness - amphetamine -dextroamphetamine  (ADDERALL) 20 MG tablet; Take 1 tablet (20 mg total) by mouth 2 (two) times daily.  Dispense: 60 tablet; Refill: 0 - amphetamine -dextroamphetamine  (ADDERALL) 20 MG tablet; Take 1 tablet (20 mg total) by mouth 2 (two) times daily.  Dispense: 60 tablet; Refill: 0 - amphetamine -dextroamphetamine  (ADDERALL) 20 MG tablet; Take 1 tablet (20 mg total) by mouth 2 (two) times daily.  Dispense: 60 tablet; Refill: 0  3. Neuropathic pain (Primary) Followed by pain management   General Counseling: elford evilsizer understanding of the findings of todays visit and agrees with plan of treatment. I have discussed any further diagnostic evaluation that may be needed or ordered today. We also reviewed his medications today. he has been encouraged to call the office with any questions or concerns that should arise related to todays visit.    No orders of the defined types were placed in this encounter.   Meds ordered this encounter  Medications    amphetamine -dextroamphetamine  (ADDERALL) 20 MG tablet    Sig: Take 1 tablet (20 mg total) by mouth 2 (two) times daily.    Dispense:  60 tablet    Refill:  0  amphetamine -dextroamphetamine  (ADDERALL) 20 MG tablet    Sig: Take 1 tablet (20 mg total) by mouth 2 (two) times daily.    Dispense:  60 tablet    Refill:  0   amphetamine -dextroamphetamine  (ADDERALL) 20 MG tablet    Sig: Take 1 tablet (20 mg total) by mouth 2 (two) times daily.    Dispense:  60 tablet    Refill:  0    This patient was seen by Tinnie Pro, PA-C in collaboration with Dr. Sigrid Bathe as a part of collaborative care agreement.   Total time spent:30 Minutes Time spent includes review of chart, medications, test results, and follow up plan with the patient.      Dr Fozia M Khan Internal medicine

## 2024-08-03 DIAGNOSIS — Z79899 Other long term (current) drug therapy: Secondary | ICD-10-CM | POA: Diagnosis not present

## 2024-08-27 DIAGNOSIS — Z79899 Other long term (current) drug therapy: Secondary | ICD-10-CM | POA: Diagnosis not present

## 2024-08-27 DIAGNOSIS — R3989 Other symptoms and signs involving the genitourinary system: Secondary | ICD-10-CM | POA: Diagnosis not present

## 2024-08-27 DIAGNOSIS — M47816 Spondylosis without myelopathy or radiculopathy, lumbar region: Secondary | ICD-10-CM | POA: Diagnosis not present

## 2024-08-27 DIAGNOSIS — R300 Dysuria: Secondary | ICD-10-CM | POA: Diagnosis not present

## 2024-08-27 DIAGNOSIS — Z6826 Body mass index (BMI) 26.0-26.9, adult: Secondary | ICD-10-CM | POA: Diagnosis not present

## 2024-09-27 DIAGNOSIS — Z79899 Other long term (current) drug therapy: Secondary | ICD-10-CM | POA: Diagnosis not present

## 2024-09-27 DIAGNOSIS — Z6826 Body mass index (BMI) 26.0-26.9, adult: Secondary | ICD-10-CM | POA: Diagnosis not present

## 2024-09-27 DIAGNOSIS — M47816 Spondylosis without myelopathy or radiculopathy, lumbar region: Secondary | ICD-10-CM | POA: Diagnosis not present

## 2024-09-28 DIAGNOSIS — Z6826 Body mass index (BMI) 26.0-26.9, adult: Secondary | ICD-10-CM | POA: Diagnosis not present

## 2024-09-28 DIAGNOSIS — M47816 Spondylosis without myelopathy or radiculopathy, lumbar region: Secondary | ICD-10-CM | POA: Diagnosis not present

## 2024-09-28 DIAGNOSIS — Z79899 Other long term (current) drug therapy: Secondary | ICD-10-CM | POA: Diagnosis not present

## 2024-09-30 DIAGNOSIS — Z79899 Other long term (current) drug therapy: Secondary | ICD-10-CM | POA: Diagnosis not present

## 2024-10-25 ENCOUNTER — Encounter: Payer: Self-pay | Admitting: Physician Assistant

## 2024-10-25 ENCOUNTER — Ambulatory Visit: Admitting: Physician Assistant

## 2024-10-25 VITALS — BP 105/68 | HR 92 | Temp 98.0°F | Resp 16 | Ht 72.0 in | Wt 193.6 lb

## 2024-10-25 DIAGNOSIS — G4719 Other hypersomnia: Secondary | ICD-10-CM

## 2024-10-25 DIAGNOSIS — Z1212 Encounter for screening for malignant neoplasm of rectum: Secondary | ICD-10-CM

## 2024-10-25 DIAGNOSIS — R0602 Shortness of breath: Secondary | ICD-10-CM | POA: Diagnosis not present

## 2024-10-25 DIAGNOSIS — Z1211 Encounter for screening for malignant neoplasm of colon: Secondary | ICD-10-CM

## 2024-10-25 DIAGNOSIS — R41840 Attention and concentration deficit: Secondary | ICD-10-CM

## 2024-10-25 DIAGNOSIS — Z79899 Other long term (current) drug therapy: Secondary | ICD-10-CM

## 2024-10-25 LAB — POCT URINE DRUG SCREEN
Methylenedioxyamphetamine: NOT DETECTED
POC Amphetamine UR: POSITIVE — AB
POC BENZODIAZEPINES UR: NOT DETECTED
POC Barbiturate UR: NOT DETECTED
POC Cocaine UR: NOT DETECTED
POC Ecstasy UR: NOT DETECTED
POC Marijuana UR: NOT DETECTED
POC Methadone UR: NOT DETECTED
POC Methamphetamine UR: NOT DETECTED
POC Opiate Ur: NOT DETECTED
POC Oxycodone UR: POSITIVE — AB
POC PHENCYCLIDINE UR: NOT DETECTED
POC TRICYCLICS UR: NOT DETECTED

## 2024-10-25 MED ORDER — AMPHETAMINE-DEXTROAMPHETAMINE 20 MG PO TABS: 20.0000 mg | ORAL_TABLET | Freq: Two times a day (BID) | ORAL | 0 refills | Status: AC

## 2024-10-25 NOTE — Progress Notes (Signed)
 [facility] [address] [place], [place] [address]  Internal MEDICINE  Office Visit Note  Patient Name: Kevin Whitehead  [id number]  [id number]  Date of Service: 10/25/2024  Chief Complaint  Patient presents with   Follow-up    ADHD    Quality Metric Gaps    Colonoscopy     HPI Pt is here for routine follow up -still finds himself getting SOB with exertion despite cooler weather. He thought it was worse in hot weather and would improve, but is still SOB with exertion. Tried to exercise but is limited. -sick a month ago and felt like cough worse especially at night. Will still have cough at night despite feeling better. Denies noticing any reflux symptoms -former smoker, 1/2 ppd for about 10-15years off and on -States family hx of lung CA and COPD -more muscle cramps and admits he could drink more water -due for colon screening, initially wanted to do colonoscopy however clean out process would be difficult as going to the bathroom frequently is challenging since his spinal injury. He therefore would prefer to do cologuard this time. He has no Fhx of colon CA  Current Medication: Outpatient Encounter Medications as of 10/25/2024  Medication Sig Note   acetaminophen  (TYLENOL ) 325 MG tablet Take 2 tablets (650 mg total) by mouth every 6 (six) hours as needed for mild pain (or Fever >/= 101).    Ascorbic Acid (VITAMIN C) 1000 MG tablet Take 1,000 mg by mouth daily.    bisacodyl  (DULCOLAX) 10 MG suppository Place 1 suppository (10 mg total) rectally daily after supper.    cyanocobalamin  1000 MCG tablet Take 1,000 mcg by mouth daily. 5,000 mcg    docusate sodium  (COLACE) 100 MG capsule Take 100 mg by mouth daily.    fluticasone (FLONASE) 50 MCG/ACT nasal spray Place 1 spray into both nostrils daily as needed for allergies or rhinitis.    hydrocortisone  (ANUSOL -HC) 25 MG suppository PLACE ONE SUPPOSITORY RECTALLY TWO TIMES DAILY    meloxicam  (MOBIC ) 15 MG tablet Take by  mouth.    Multiple Vitamin (MULTIVITAMIN) capsule Take 1 capsule by mouth daily.    oxyCODONE  10 MG TABS Take 1 tablet (10 mg total) by mouth every 3 (three) hours as needed for moderate pain. 12/06/2020: #50 on 11/28/20 #12 today LD 12/06/20   polyethylene glycol (MIRALAX  / GLYCOLAX ) 17 g packet Take 17 g by mouth daily.    [DISCONTINUED] amphetamine -dextroamphetamine  (ADDERALL) 20 MG tablet Take 1 tablet (20 mg total) by mouth 2 (two) times daily.    [DISCONTINUED] amphetamine -dextroamphetamine  (ADDERALL) 20 MG tablet Take 1 tablet (20 mg total) by mouth 2 (two) times daily.    [DISCONTINUED] amphetamine -dextroamphetamine  (ADDERALL) 20 MG tablet Take 1 tablet (20 mg total) by mouth 2 (two) times daily.    [DISCONTINUED] diazepam  (VALIUM ) 5 MG tablet TAKE 1-2 TABLETS (5-10 MG TOTAL) BY MOUTH AT BEDTIME AS NEEDED FOR MUSCLE SPASMS.    [DISCONTINUED] sildenafil (VIAGRA) 100 MG tablet Take 100 mg by mouth as needed.    [START ON 11/13/2024] amphetamine -dextroamphetamine  (ADDERALL) 20 MG tablet Take 1 tablet (20 mg total) by mouth 2 (two) times daily.    [START ON 12/13/2024] amphetamine -dextroamphetamine  (ADDERALL) 20 MG tablet Take 1 tablet (20 mg total) by mouth 2 (two) times daily.    [START ON 01/12/2025] amphetamine -dextroamphetamine  (ADDERALL) 20 MG tablet Take 1 tablet (20 mg total) by mouth 2 (two) times daily.    No facility-administered encounter medications on file as of 10/25/2024.  Surgical History: Past Surgical History:  Procedure Laterality Date   ANKLE SURGERY  2016   POSTERIOR LUMBAR FUSION 4 LEVEL N/A 10/30/2020   Procedure: POSTERIOR LUMBAR FUSION 4 LEVEL;  Surgeon: Louis Shove, MD;  Location: [facility] OR;  Service: Neurosurgery;  Laterality: N/A;  Lumbar one decompressive laminectomy with transpedicular decompression, Thoracic eleven through Lumbar three posterior lateral arthrodesis utilizing pedicle screw fixation and local autografting    Medical History: Past Medical History:   Diagnosis Date   Attention and concentration deficit    Hemorrhoid     Family History: Family History  Problem Relation Age of Onset   COPD Father     Social History   Socioeconomic History   Marital status: Married    Spouse name: Not on file   Number of children: Not on file   Years of education: Not on file   Highest education level: Not on file  Occupational History   Not on file  Tobacco Use   Smoking status: Former    Current packs/day: 0.00    Types: Cigarettes    Quit date: 2017    Years since quitting: 8.9   Smokeless tobacco: Never  Vaping Use   Vaping status: Never Used  Substance and Sexual Activity   Alcohol use: No   Drug use: No   Sexual activity: Not on file  Other Topics Concern   Not on file  Social History Narrative   Not on file   Social Drivers of Health   Financial Resource Strain: Not on file  Food Insecurity: Not on file  Transportation Needs: Not on file  Physical Activity: Not on file  Stress: Not on file  Social Connections: Not on file  Intimate Partner Violence: Not on file      Review of Systems  Constitutional:  Positive for fatigue. Negative for chills and unexpected weight change.  HENT:  Negative for congestion, postnasal drip, rhinorrhea, sneezing and sore throat.   Eyes:  Negative for redness.  Respiratory:  Negative for cough and chest tightness.        SOB with exertion only  Cardiovascular:  Negative for chest pain and palpitations.  Gastrointestinal:  Negative for abdominal pain, constipation, diarrhea, nausea and vomiting.  Genitourinary:  Positive for difficulty urinating. Negative for dysuria and frequency.       Self catheterization   Musculoskeletal:  Positive for arthralgias and back pain. Negative for joint swelling and neck pain.  Skin:  Negative for rash.  Neurological:  Positive for numbness. Negative for tremors.       Cauda equina compression-saddle paresthesia with decreased sensation down legs   Hematological:  Negative for adenopathy. Does not bruise/bleed easily.  Psychiatric/Behavioral:  Positive for decreased concentration. Negative for behavioral problems (Depression), sleep disturbance and suicidal ideas. The patient is not nervous/anxious.     Vital Signs: BP 105/68   Pulse 92   Temp 98 F (36.7 C)   Resp 16   Ht 6' (1.829 m)   Wt 193 lb 9.6 oz (87.8 kg)   SpO2 96%   BMI 26.26 kg/m    Physical Exam Vitals and nursing note reviewed.  Constitutional:      General: He is not in acute distress.    Appearance: He is well-developed. He is not diaphoretic.  HENT:     Head: Normocephalic and atraumatic.  Eyes:     Extraocular Movements: Extraocular movements intact.  Neck:     Thyroid : No thyromegaly.     Vascular:  No JVD.     Trachea: No tracheal deviation.  Cardiovascular:     Rate and Rhythm: Normal rate and regular rhythm.     Heart sounds: Normal heart sounds. No murmur heard.    No friction rub. No gallop.  Pulmonary:     Effort: Pulmonary effort is normal. No respiratory distress.     Breath sounds: Normal breath sounds. No stridor. No wheezing or rales.  Chest:     Chest wall: No tenderness.  Musculoskeletal:     Comments: Limited ROM from back injury  Skin:    General: Skin is warm and dry.  Neurological:     Mental Status: He is alert.     Cranial Nerves: No cranial nerve deficit.     Sensory: Sensory deficit present.     Motor: No abnormal muscle tone.     Coordination: Coordination normal.     Deep Tendon Reflexes: Reflexes are normal and symmetric.  Psychiatric:        Behavior: Behavior normal.        Thought Content: Thought content normal.        Judgment: Judgment normal.        Assessment/Plan: 1. SOB (shortness of breath) (Primary) Will order CT chest for further evaluation, may need echo in future as well - CT Chest Wo Contrast; Future  2. Attention and concentration deficit May continue adderall as before -  amphetamine -dextroamphetamine  (ADDERALL) 20 MG tablet; Take 1 tablet (20 mg total) by mouth 2 (two) times daily.  Dispense: 60 tablet; Refill: 0 - amphetamine -dextroamphetamine  (ADDERALL) 20 MG tablet; Take 1 tablet (20 mg total) by mouth 2 (two) times daily.  Dispense: 60 tablet; Refill: 0 - amphetamine -dextroamphetamine  (ADDERALL) 20 MG tablet; Take 1 tablet (20 mg total) by mouth 2 (two) times daily.  Dispense: 60 tablet; Refill: 0 [place] Controlled Substance Database was reviewed by me for overdose risk score (ORS) Refilled Controlled medications today. Reviewed risks and possible side effects associated with taking Stimulants. Combination of these drugs with other psychotropic medications could cause dizziness and drowsiness. Pt needs to Monitor symptoms and exercise caution in driving and operating heavy machinery to avoid damages to oneself, to others and to the surroundings. Patient verbalized understanding in this matter. Dependence and abuse for these drugs will be monitored closely. A Controlled substance policy and procedure is on file which allows [facility] to order a urine drug screen test at any visit. Patient understands and agrees with the plan..  3. Excessive daytime sleepiness - amphetamine -dextroamphetamine  (ADDERALL) 20 MG tablet; Take 1 tablet (20 mg total) by mouth 2 (two) times daily.  Dispense: 60 tablet; Refill: 0 - amphetamine -dextroamphetamine  (ADDERALL) 20 MG tablet; Take 1 tablet (20 mg total) by mouth 2 (two) times daily.  Dispense: 60 tablet; Refill: 0 - amphetamine -dextroamphetamine  (ADDERALL) 20 MG tablet; Take 1 tablet (20 mg total) by mouth 2 (two) times daily.  Dispense: 60 tablet; Refill: 0  4. Screening for colorectal cancer - Cologuard  5. Encounter for long-term (current) use of high-risk medication - POCT Urine Drug Screen   General Counseling: Wanya verbalizes understanding of the findings of todays visit and agrees with plan of treatment. I  have discussed any further diagnostic evaluation that may be needed or ordered today. We also reviewed his medications today. he has been encouraged to call the office with any questions or concerns that should arise related to todays visit.    Orders Placed This Encounter  Procedures   CT Chest  Wo Contrast   Cologuard   POCT Urine Drug Screen    Meds ordered this encounter  Medications   amphetamine -dextroamphetamine  (ADDERALL) 20 MG tablet    Sig: Take 1 tablet (20 mg total) by mouth 2 (two) times daily.    Dispense:  60 tablet    Refill:  0   amphetamine -dextroamphetamine  (ADDERALL) 20 MG tablet    Sig: Take 1 tablet (20 mg total) by mouth 2 (two) times daily.    Dispense:  60 tablet    Refill:  0   amphetamine -dextroamphetamine  (ADDERALL) 20 MG tablet    Sig: Take 1 tablet (20 mg total) by mouth 2 (two) times daily.    Dispense:  60 tablet    Refill:  0    This patient was seen by Tinnie Pro, PA-C in collaboration with Dr. Sigrid Bathe as a part of collaborative care agreement.   Total time spent:30 Minutes Time spent includes review of chart, medications, test results, and follow up plan with the patient.      Dr Fozia M Khan Internal medicine

## 2024-10-27 DIAGNOSIS — M47816 Spondylosis without myelopathy or radiculopathy, lumbar region: Secondary | ICD-10-CM | POA: Diagnosis not present

## 2024-10-27 DIAGNOSIS — M129 Arthropathy, unspecified: Secondary | ICD-10-CM | POA: Diagnosis not present

## 2024-10-27 DIAGNOSIS — E559 Vitamin D deficiency, unspecified: Secondary | ICD-10-CM | POA: Diagnosis not present

## 2024-10-27 DIAGNOSIS — Z6826 Body mass index (BMI) 26.0-26.9, adult: Secondary | ICD-10-CM | POA: Diagnosis not present

## 2024-10-27 DIAGNOSIS — Z79899 Other long term (current) drug therapy: Secondary | ICD-10-CM | POA: Diagnosis not present

## 2024-10-28 ENCOUNTER — Inpatient Hospital Stay: Admission: RE | Admit: 2024-10-28 | Discharge: 2024-10-28 | Attending: Physician Assistant

## 2024-10-28 DIAGNOSIS — R0602 Shortness of breath: Secondary | ICD-10-CM

## 2024-10-28 DIAGNOSIS — R918 Other nonspecific abnormal finding of lung field: Secondary | ICD-10-CM | POA: Diagnosis not present

## 2024-11-05 ENCOUNTER — Ambulatory Visit: Payer: Self-pay | Admitting: Physician Assistant

## 2024-11-14 DIAGNOSIS — Z1211 Encounter for screening for malignant neoplasm of colon: Secondary | ICD-10-CM | POA: Diagnosis not present

## 2024-11-14 DIAGNOSIS — Z1212 Encounter for screening for malignant neoplasm of rectum: Secondary | ICD-10-CM | POA: Diagnosis not present

## 2024-11-19 LAB — COLOGUARD: COLOGUARD: NEGATIVE

## 2024-11-25 ENCOUNTER — Encounter: Payer: Self-pay | Admitting: Physician Assistant

## 2024-11-25 ENCOUNTER — Ambulatory Visit: Admitting: Physician Assistant

## 2024-11-25 VITALS — BP 103/67 | HR 96 | Temp 98.0°F | Resp 16 | Ht 72.0 in | Wt 197.2 lb

## 2024-11-25 DIAGNOSIS — R0602 Shortness of breath: Secondary | ICD-10-CM | POA: Diagnosis not present

## 2024-11-25 DIAGNOSIS — M792 Neuralgia and neuritis, unspecified: Secondary | ICD-10-CM

## 2024-11-25 NOTE — Progress Notes (Signed)
 [facility] [address] [place], [place] [address]  Internal MEDICINE  Office Visit Note  Patient Name: Kevin Whitehead  [id number]  [id number]  Date of Service: 11/25/2024  Chief Complaint  Patient presents with   Follow-up    Review CT     HPI Pt is here for routine follow up to review CT results CT Chest: IMPRESSION:  1. No acute findings in the chest.  2. Stable 2 mm left upper lobe pulmonary nodule and stable 5 mm subpleural  right lower lobe pulmonary nodule; no routine follow-up imaging is recommended  as per Fleischner Society Guidelines.   -still has some SOB with exertion only, no CP, heart racing, dizziness. Ongoing for the last year. Plans to try to slowly increase exercise and build endurance back up  -cologuard negative; does wonder about staying cleaned out as he can't push to evacuate bowels due to past injury. He used to use docusate and miralax  daily. Now only uses miralax  every few days. Doesn't feel constipated. Never saw GI. May increase frequency.  Current Medication: Outpatient Encounter Medications as of 11/25/2024  Medication Sig Note   acetaminophen  (TYLENOL ) 325 MG tablet Take 2 tablets (650 mg total) by mouth every 6 (six) hours as needed for mild pain (or Fever >/= 101).    amphetamine -dextroamphetamine  (ADDERALL) 20 MG tablet Take 1 tablet (20 mg total) by mouth 2 (two) times daily.    [START ON 12/13/2024] amphetamine -dextroamphetamine  (ADDERALL) 20 MG tablet Take 1 tablet (20 mg total) by mouth 2 (two) times daily.    [START ON 01/12/2025] amphetamine -dextroamphetamine  (ADDERALL) 20 MG tablet Take 1 tablet (20 mg total) by mouth 2 (two) times daily.    Ascorbic Acid (VITAMIN C) 1000 MG tablet Take 1,000 mg by mouth daily.    cyanocobalamin  1000 MCG tablet Take 1,000 mcg by mouth daily. 5,000 mcg    fluticasone (FLONASE) 50 MCG/ACT nasal spray Place 1 spray into both nostrils daily as needed for allergies or rhinitis.    meloxicam   (MOBIC ) 15 MG tablet Take by mouth.    Multiple Vitamin (MULTIVITAMIN) capsule Take 1 capsule by mouth daily.    oxyCODONE  10 MG TABS Take 1 tablet (10 mg total) by mouth every 3 (three) hours as needed for moderate pain. 12/06/2020: #50 on 11/28/20 #12 today LD 12/06/20   polyethylene glycol (MIRALAX  / GLYCOLAX ) 17 g packet Take 17 g by mouth daily.    [DISCONTINUED] bisacodyl  (DULCOLAX) 10 MG suppository Place 1 suppository (10 mg total) rectally daily after supper.    [DISCONTINUED] docusate sodium  (COLACE) 100 MG capsule Take 100 mg by mouth daily.    [DISCONTINUED] hydrocortisone  (ANUSOL -HC) 25 MG suppository PLACE ONE SUPPOSITORY RECTALLY TWO TIMES DAILY    No facility-administered encounter medications on file as of 11/25/2024.    Surgical History: Past Surgical History:  Procedure Laterality Date   ANKLE SURGERY  2016   POSTERIOR LUMBAR FUSION 4 LEVEL N/A 10/30/2020   Procedure: POSTERIOR LUMBAR FUSION 4 LEVEL;  Surgeon: Louis Shove, MD;  Location: [facility] OR;  Service: Neurosurgery;  Laterality: N/A;  Lumbar one decompressive laminectomy with transpedicular decompression, Thoracic eleven through Lumbar three posterior lateral arthrodesis utilizing pedicle screw fixation and local autografting    Medical History: Past Medical History:  Diagnosis Date   Attention and concentration deficit    Hemorrhoid     Family History: Family History  Problem Relation Age of Onset   COPD Father     Social History   Socioeconomic  History   Marital status: Married    Spouse name: Not on file   Number of children: Not on file   Years of education: Not on file   Highest education level: Not on file  Occupational History   Not on file  Tobacco Use   Smoking status: Former    Current packs/day: 0.00    Types: Cigarettes    Quit date: 2017    Years since quitting: 9.0   Smokeless tobacco: Never  Vaping Use   Vaping status: Never Used  Substance and Sexual Activity   Alcohol use: No    Drug use: No   Sexual activity: Not on file  Other Topics Concern   Not on file  Social History Narrative   Not on file   Social Drivers of Health   Tobacco Use: Medium Risk (11/25/2024)   Patient History    Smoking Tobacco Use: Former    Smokeless Tobacco Use: Never    Passive Exposure: Not on Actuary Strain: Not on file  Food Insecurity: Not on file  Transportation Needs: Not on file  Physical Activity: Not on file  Stress: Not on file  Social Connections: Not on file  Intimate Partner Violence: Not on file  Depression (PHQ2-9): Low Risk (10/25/2024)   Depression (PHQ2-9)    PHQ-2 Score: 0  Alcohol Screen: Not on file  Housing: Not on file  Utilities: Not on file  Health Literacy: Not on file      Review of Systems  Constitutional:  Positive for fatigue. Negative for chills and unexpected weight change.  HENT:  Negative for congestion, postnasal drip, rhinorrhea, sneezing and sore throat.   Eyes:  Negative for redness.  Respiratory:  Negative for cough and chest tightness.        SOB with exertion only  Cardiovascular:  Negative for chest pain, palpitations and leg swelling.  Gastrointestinal:  Negative for abdominal pain, constipation, diarrhea, nausea and vomiting.  Genitourinary:  Positive for difficulty urinating. Negative for dysuria and frequency.       Self catheterization   Musculoskeletal:  Positive for arthralgias and back pain. Negative for joint swelling and neck pain.  Skin:  Negative for rash.  Neurological:  Positive for numbness. Negative for tremors.       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 Does not bruise/bleed easily.  Psychiatric/Behavioral:  Positive for decreased concentration. Negative for behavioral problems (Depression), sleep disturbance and suicidal ideas. The patient is not nervous/anxious.     Vital Signs: BP 103/67   Pulse 96   Temp  98 F (36.7 C)   Resp 16   Ht 6' (1.829 m)   Wt 197 lb 3.2 oz (89.4 kg)   SpO2 98%   BMI 26.75 kg/m    Physical Exam Vitals and nursing note reviewed.  Constitutional:      General: He is not in acute distress.    Appearance: He is well-developed. He is not diaphoretic.  HENT:     Head: Normocephalic and atraumatic.  Eyes:     Extraocular Movements: Extraocular movements intact.  Neck:     Thyroid : No thyromegaly.     Vascular: No JVD.     Trachea: No tracheal deviation.  Cardiovascular:     Rate and Rhythm: Normal rate and regular rhythm.     Heart sounds: Normal heart sounds. No murmur heard.    No friction rub. No gallop.  Pulmonary:  Effort: Pulmonary effort is normal. No respiratory distress.     Breath sounds: Normal breath sounds. No stridor. No wheezing or rales.  Chest:     Chest wall: No tenderness.  Musculoskeletal:     Comments: Limited ROM from back injury  Skin:    General: Skin is warm and dry.  Neurological:     Mental Status: He is alert.     Sensory: Sensory deficit present.     Motor: No abnormal muscle tone.     Coordination: Coordination normal.     Deep Tendon Reflexes: Reflexes are normal and symmetric.  Psychiatric:        Behavior: Behavior normal.        Thought Content: Thought content normal.        Judgment: Judgment normal.        Assessment/Plan: 1. SOB (shortness of breath) on exertion (Primary) CT reviewed. Offered echo and additional testing for other causes of SOBOE, but pt declines for now. Will slowly increase activity as tolerated, but will call if any concerns or new symptoms arise. Did discuss opioid impact on respiratory drive, however patient has been on these for years prior to symptoms.  2. Neuropathic pain Followed by pain management   General Counseling: Helayne oakland understanding of the findings of todays visit and agrees with plan of treatment. I have discussed any further diagnostic evaluation that  may be needed or ordered today. We also reviewed his medications today. he has been encouraged to call the office with any questions or concerns that should arise related to todays visit.    No orders of the defined types were placed in this encounter.   No orders of the defined types were placed in this encounter.   This patient was seen by Tinnie Pro, PA-C in collaboration with Dr. Sigrid Bathe as a part of collaborative care agreement.   Total time spent:30 Minutes Time spent includes review of chart, medications, test results, and follow up plan with the patient.      Dr Fozia M Khan Internal medicine

## 2025-02-03 ENCOUNTER — Ambulatory Visit: Admitting: Physician Assistant

## 2025-05-05 ENCOUNTER — Encounter: Admitting: Physician Assistant
